# Patient Record
Sex: Male | Born: 1950 | Race: Black or African American | Hispanic: No | Smoking: Former smoker
Health system: Southern US, Community
[De-identification: ages and names within clinical notes are randomized; demographics above are authoritative.]

## PROBLEM LIST (undated history)

## (undated) DIAGNOSIS — E7800 Pure hypercholesterolemia, unspecified: Secondary | ICD-10-CM

## (undated) DIAGNOSIS — R7303 Prediabetes: Secondary | ICD-10-CM

## (undated) DIAGNOSIS — M549 Dorsalgia, unspecified: Secondary | ICD-10-CM

## (undated) DIAGNOSIS — N183 Chronic kidney disease, stage 3 unspecified: Secondary | ICD-10-CM

## (undated) DIAGNOSIS — M2550 Pain in unspecified joint: Secondary | ICD-10-CM

## (undated) DIAGNOSIS — Z951 Presence of aortocoronary bypass graft: Secondary | ICD-10-CM

## (undated) DIAGNOSIS — I252 Old myocardial infarction: Secondary | ICD-10-CM

## (undated) DIAGNOSIS — E8881 Metabolic syndrome: Secondary | ICD-10-CM

## (undated) DIAGNOSIS — E785 Hyperlipidemia, unspecified: Secondary | ICD-10-CM

## (undated) DIAGNOSIS — K219 Gastro-esophageal reflux disease without esophagitis: Secondary | ICD-10-CM

## (undated) DIAGNOSIS — I10 Essential (primary) hypertension: Secondary | ICD-10-CM

## (undated) DIAGNOSIS — Z9289 Personal history of other medical treatment: Secondary | ICD-10-CM

## (undated) DIAGNOSIS — R079 Chest pain, unspecified: Secondary | ICD-10-CM

## (undated) DIAGNOSIS — K759 Inflammatory liver disease, unspecified: Secondary | ICD-10-CM

## (undated) DIAGNOSIS — M1990 Unspecified osteoarthritis, unspecified site: Secondary | ICD-10-CM

## (undated) DIAGNOSIS — I2510 Atherosclerotic heart disease of native coronary artery without angina pectoris: Secondary | ICD-10-CM

## (undated) DIAGNOSIS — N200 Calculus of kidney: Secondary | ICD-10-CM

## (undated) HISTORY — DX: Essential (primary) hypertension: I10

## (undated) HISTORY — DX: Chest pain, unspecified: R07.9

## (undated) HISTORY — DX: Pain in unspecified joint: M25.50

## (undated) HISTORY — DX: Pure hypercholesterolemia, unspecified: E78.00

## (undated) HISTORY — DX: Dorsalgia, unspecified: M54.9

## (undated) HISTORY — DX: Metabolic syndrome: E88.810

## (undated) HISTORY — DX: Hyperlipidemia, unspecified: E78.5

## (undated) HISTORY — DX: Atherosclerotic heart disease of native coronary artery without angina pectoris: I25.10

## (undated) HISTORY — DX: Metabolic syndrome: E88.81

## (undated) HISTORY — PX: KNEE ARTHROSCOPY: SUR90

## (undated) HISTORY — DX: Personal history of other medical treatment: Z92.89

## (undated) HISTORY — DX: Old myocardial infarction: I25.2

---
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
## 2002-01-13 ENCOUNTER — Ambulatory Visit (HOSPITAL_BASED_OUTPATIENT_CLINIC_OR_DEPARTMENT_OTHER): Admission: RE | Admit: 2002-01-13 | Discharge: 2002-01-13 | Payer: Self-pay | Admitting: Orthopaedic Surgery

## 2005-06-27 ENCOUNTER — Emergency Department (HOSPITAL_COMMUNITY): Admission: EM | Admit: 2005-06-27 | Discharge: 2005-06-27 | Payer: Self-pay | Admitting: Emergency Medicine

## 2007-03-25 DIAGNOSIS — I252 Old myocardial infarction: Secondary | ICD-10-CM

## 2007-03-25 HISTORY — PX: CORONARY ANGIOPLASTY WITH STENT PLACEMENT: SHX49

## 2007-03-25 HISTORY — DX: Old myocardial infarction: I25.2

## 2007-04-15 ENCOUNTER — Inpatient Hospital Stay (HOSPITAL_COMMUNITY): Admission: EM | Admit: 2007-04-15 | Discharge: 2007-04-17 | Payer: Self-pay | Admitting: Cardiology

## 2007-04-24 HISTORY — PX: TRANSTHORACIC ECHOCARDIOGRAM: SHX275

## 2008-12-24 HISTORY — PX: CARDIAC CATHETERIZATION: SHX172

## 2009-03-08 ENCOUNTER — Encounter (INDEPENDENT_AMBULATORY_CARE_PROVIDER_SITE_OTHER): Payer: Self-pay | Admitting: *Deleted

## 2009-03-08 ENCOUNTER — Ambulatory Visit: Payer: Self-pay | Admitting: Gastroenterology

## 2009-03-11 ENCOUNTER — Telehealth (INDEPENDENT_AMBULATORY_CARE_PROVIDER_SITE_OTHER): Payer: Self-pay | Admitting: *Deleted

## 2009-04-04 ENCOUNTER — Encounter: Admission: RE | Admit: 2009-04-04 | Discharge: 2009-04-04 | Payer: Self-pay | Admitting: Cardiology

## 2009-04-07 ENCOUNTER — Ambulatory Visit (HOSPITAL_COMMUNITY): Admission: RE | Admit: 2009-04-07 | Discharge: 2009-04-07 | Payer: Self-pay | Admitting: Cardiology

## 2009-04-22 ENCOUNTER — Encounter: Payer: Self-pay | Admitting: Gastroenterology

## 2009-04-22 ENCOUNTER — Ambulatory Visit: Payer: Self-pay | Admitting: Gastroenterology

## 2009-04-25 ENCOUNTER — Encounter: Payer: Self-pay | Admitting: Gastroenterology

## 2010-06-28 ENCOUNTER — Ambulatory Visit (HOSPITAL_COMMUNITY): Admission: RE | Admit: 2010-06-28 | Discharge: 2010-06-28 | Payer: Self-pay | Admitting: Orthopaedic Surgery

## 2010-06-28 ENCOUNTER — Ambulatory Visit: Payer: Self-pay | Admitting: Vascular Surgery

## 2010-08-01 ENCOUNTER — Ambulatory Visit (HOSPITAL_BASED_OUTPATIENT_CLINIC_OR_DEPARTMENT_OTHER): Admission: RE | Admit: 2010-08-01 | Discharge: 2010-08-01 | Payer: Self-pay | Admitting: Orthopaedic Surgery

## 2011-03-09 LAB — BASIC METABOLIC PANEL
CO2: 25 mEq/L (ref 19–32)
Chloride: 106 mEq/L (ref 96–112)
GFR calc Af Amer: >60 mL/min (ref >60)
Glucose, Bld: 113 mg/dL — ABNORMAL HIGH (ref 70–99)
Potassium: 4.2 mEq/L (ref 3.5–5.1)
Sodium: 139 mEq/L (ref 135–145)

## 2011-04-04 LAB — LIPID PANEL
Cholesterol: 106 mg/dL (ref 0–200)
HDL: 21 mg/dL — ABNORMAL LOW (ref >39)
Total CHOL/HDL Ratio: 5 RATIO
VLDL: 16 mg/dL (ref 0–40)

## 2011-04-04 LAB — HEPATIC FUNCTION PANEL
ALT: 28 U/L (ref 0–53)
AST: 27 U/L (ref 0–37)
Albumin: 4 g/dL (ref 3.5–5.2)
Alkaline Phosphatase: 76 U/L (ref 39–117)
Total Protein: 7.4 g/dL (ref 6.0–8.3)

## 2011-05-08 NOTE — Cardiovascular Report (Signed)
NAMEHILLIS, MCPHATTER NO.:  [id number]   MEDICAL RECORD NO.:  [id number]          PATIENT TYPE:  OIB   LOCATION:  2899                         FACILITY:  [facility]   PHYSICIAN:  Cristy Hilts. Jacinto Halim, MD       DATE OF BIRTH:  1951/01/14   DATE OF PROCEDURE:  DATE OF DISCHARGE:  04/07/2009                            CARDIAC CATHETERIZATION   PROCEDURE PERFORMED:  1. Left ventriculography.  2. Selective right and left coronary arteriography.   INDICATIONS:  [name] is a 60 year old gentleman with a known  coronary artery disease.  He had a diagnostic cardiac catheterization  for angina pectoris and was found to have high-grade stenosis in  circumflex coronary artery.  He had undergone PTCA and stenting of the  mid circumflex coronary artery with a 4.0 x 15-mm Vision stent  implantation on April 15, 2007.  He had distal circumflex disease which  is codominant of 60-70% and a spicule of 60-70% stenosis in the proximal  to mid segment of the LAD.  Because of recurrent chest pain, he is now  brought to the cardiac catheterization lab to reevaluate his coronary  anatomy.   HEMODYNAMIC DATA:  The left ventricular pressure was 130/2 with end-  diastolic pressure of 9 mmHg.  The aortic pressure was 126/71 with a  mean of 93 mmHg.  There was no pressure gradient across the aortic  valve.   ANGIOGRAPHIC DATA:  Left ventricle:  Left ventricular systolic function  was normal with the ejection fraction of 60%.  There was no regional  wall motion abnormality.   Right coronary artery:  The right coronary artery is a dominant vessel.  It gives origin to 2 PDA branches.  It is smooth and normal.   Left main coronary artery:  Left main coronary artery is large-caliber  vessel which is smooth and normal.   Circumflex:  The circumflex coronary artery is codominant with right  coronary artery.  It gives origin to a very tiny OM1, moderate-sized  OM2, moderate-sized OM3.  There is a  mild ectasia noted in the mid  segment.  Distal circumflex shows a 60-70% stenosis unchanged from prior  cardiac catheterization.  The previously placed stent in the mid  circumflex is widely patent.   LAD:  LAD is a large caliber vessel.  There is again a calcific spicule  that is noted in the proximal to mid segment of the LAD with 60%  stenosis unchanged from prior cardiac catheterization.  Distal LAD is  very nice and smooth.   IMPRESSION AND PLAN:  [name] is a 60 year old gentleman who has  been doing well and because of recurrent angina, he was brought to the  cardiac catheterization lab and this revealed an unchanged anatomy and  widely patent mid circumflex stent.  He has also previously had a stress  test on May 14, 2007, to evaluate the significance of circumflex and the  LAD distribution and this was negative on May 14, 2007.  Given this, I  suspect his chest pain is probably of noncardiac  etiology or secondary  to small vessel disease.  I would recommend continued medical therapy.   TECHNIQUE OF PROCEDURE:  Under usual sterile precautions using a 6-  French right femoral arterial access, 6-French multipurpose B2 catheter  was advanced into the ascending aorta and then to the left ventricle.  Left ventriculography was performed both in LAO and RAO projection.  Catheter pulled into the ascending aorta.  Right  coronary artery was selectively engaged, and angiography was performed,  then left main coronary artery was selectively engaged and angiography  was performed.  Then catheter was then pulled out of the body.  The  patient tolerated the procedure well.  No immediate complications were  noted.      Cristy Hilts. Jacinto Halim, MD  Electronically Signed     JRG/MEDQ  D:  04/07/2009  T:  04/08/2009  Job:  [id number]   cc:   Gabriel Earing, M.D.

## 2011-05-11 NOTE — Cardiovascular Report (Signed)
Luis Tucker, Luis Tucker NO.:  [id number]   MEDICAL RECORD NO.:  [id number]          PATIENT TYPE:  INP   LOCATION:  2807                         FACILITY:  [facility]   PHYSICIAN:  Cristy Hilts. Jacinto Halim, MD       DATE OF BIRTH:  January 14, 1951   DATE OF PROCEDURE:  04/15/2007  DATE OF DISCHARGE:                            CARDIAC CATHETERIZATION   PROCEDURE PERFORMED:  1. Left ventriculography.  2. Selective right and left coronary angiography.  3. Abdominal aortogram.  4. Percutaneous transluminal coronary angioplasty and stenting of the      circumflex coronary artery.   INDICATIONS:  Luis Tucker is a 60 year old gentleman with hypertension and  smoking, which he quit 3 weeks ago, who has been having recurrent chest  discomfort in the form of heaviness in his chest going on for the past 2-  3 weeks.  Given heaviness in his chest, he had gone to see his primary  care physician and that time, an EKG was obtained, although was having  no chest pain at the time of EKG.  Because of significant abnormalities  in the EKG, he was sent via EMS as a STEMI directly to the cardiac  catheterization lab.  The patient was asymptomatic at the time of the  start of the cardiac catheterization.   Abdominal aortogram was performed to evaluate for abdominal  arteriosclerosis and renal artery stenosis.   Having brought the patient directly to the cardiac catheterization lab,  I felt that this was really an unstable angina versus an ST-segment-  elevation MI.   HEMODYNAMIC DATA:  The left ventricular pressure was 147/10 with end-  diastolic pressure of 15 mmHg.  The aortic pressure is 132/85 with a  mean of 105 mmHg.  There was no pressure gradient across the aortic  valve.   ANGIOGRAPHIC DATA:  Left ventricle:  Left ventricular systolic function  was normal with an ejection fraction of 65%.  There was no regional wall  motion abnormality.  There was no mitral regurgitation.   Right coronary  artery:  The right coronary artery is a codominant artery  with the circumflex coronary artery.  It has got mild diffuse disease.   Left main:  The left main has mild diffuse disease.   Circumflex:  The circumflex is a large-caliber vessel and codominant  with right coronary artery.  There is a 40% ostial stenosis followed by  a mid 90% stenosis.  There is mild ectasia in the mid-to-distal segment,  where there is a 60% stenosis.   The circumflex gives origin to a moderate-sized OM-1 and a large OM-2  and continues as a PDA branch.   LAD:  The LAD is a large-caliber vessel.  It has got diffuse luminal  irregularity.  In the midsegment at the origin of a very small diagonal  #1, there is a 70% to at most 80% stenosis, probably in the 70% range.  There is appearance of a small dissection versus a calcific plaque.  This lesion appears to be eccentric.  However, there was no of haziness.  INTERVENTIONAL DATA:  Successful PTCA and direct stenting of the mid  circumflex coronary artery.  The circumflex coronary artery was hazy and  appeared to be the culprit.  With the passage of the stent, it was  almost flow-limiting stenosis.  A 4.0 x 15-mm Vision was deployed at 16  atmospheric pressure and postdilated with a 4.5 x 9-mm Quantum at 10  atmospheric pressure and 9 atmospheric pressure for 30 and 47 seconds,  respectively.  Post balloon angioplasty, excellent results were noted.  There is brisk TIMI-3 flow.   RECOMMENDATIONS:  The patient will be continued on aggressive risk  modification.  He probably will need an IVUS interrogation of the LAD.  This can be done either as an inpatient or on an outpatient basis.  I  will also discuss these findings of my colleagues.   Abdominal aortogram revealed no evidence of abdominal aortic aneurysm.  The renal arteries were widely patent, one on either side.  There was  mild plaque in the right common iliac artery and external iliac artery.   A  total of 165 mL of contrast were utilized for diagnostic angiography.   TECHNIQUE OF THE PROCEDURE:  Under the usual sterile precautions using a  6-French right femoral artery access, a 6-French multipurpose B2  catheter was advanced into the ascending aorta over a J-wire.  Left  ventriculography was performed both in LAO and RAO projection.  Then the  right coronary artery and left main coronary were selectively engaged  and angiography was performed.  Then the catheter was pulled out of the  body in the usual fashion.   TECHNIQUE OF INTERVENTION:  A 7-French Judkins left 4 guide catheter was  utilized to engage the left main coronary artery and using heparin and  Integrilin for anticoagulation, maintaining the ACT at therapeutic  range, a 40 marker guidewire was advanced into the circumflex coronary  artery and carefully measuring the lesion length, a 4.0 x 50-mm Vision  stent was deployed at 16 atmospheric pressure and postdilated with a 4.5  x 9-mm Quantum x2 at 9 and 10 atmospheric pressure.  Post balloon  angioplasty excellent results were noted.  The patient also received  intracoronary nitroglycerin.  The guidewire was withdrawn and  angiography performed.  The guide catheter was disengaged and pulled out  of body in the usual fashion.  The patient tolerated the procedure well.  The 6-French multipurpose B2 catheter was re-advanced into the abdominal  aorta and abdominal aortogram was performed.  Right femoral angiography  was performed through the arterial access sheath and access closed with  StarClose with excellent hemostasis.  The patient tolerated the  procedure well.  The patient did have mild chest discomfort at the end  of the procedure; however, there were no EKG changes.  The patient did  have significant EKG changes during balloon inflations and the chest  pain was similar to his angina at home.      Cristy Hilts. Jacinto Halim, MD  Electronically Signed     JRG/MEDQ  D:   04/15/2007  T:  04/16/2007  Job:  [id number]   cc:   Luis Tucker, M.D.

## 2011-05-11 NOTE — Op Note (Signed)
[place]  Patient:    Luis Tucker, Luis Tucker Visit Number: [id number] MRN: [id number]          Service Type: DSU Location: [facility] Attending Physician:  Marcene Corning Dictated by:   Lubertha Basque. Jerl Santos, M.D. Proc. Date: 01/13/02 Admit Date:  01/13/2002                             Operative Report  PREOPERATIVE DIAGNOSIS: 1. Left knee torn meniscus. 2. Left knee chondromalacia.  POSTOPERATIVE DIAGNOSIS: 1. Left knee torn meniscus. 2. Left knee chondromalacia.  OPERATION PERFORMED: 1. Left knee partial lateral meniscectomy. 2. Left knee chondroplasty medial and patellofemoral compartments.  ANESTHESIA:  General.  ATTENDING SURGEON:  Lubertha Basque. Jerl Santos, M.D.  ASSISTANT:  Lindwood Qua, P.A.  INDICATIONS FOR PROCEDURE:  The patient is a 60 year old man with a very long history of left knee pain since stepping in a hole in the [employer].  This has persisted.  He has undergone an MRI scan which showed some significant chondromalacia and some degeneration of his menisci.  He has failed several different oral anti-inflammatories and the use of a brace.  At this point he is offered an arthroscopy.  The procedure was discussed with the patient and informed operative consent was obtained after discussion of possible complications of reaction to anesthesia and infection.  DESCRIPTION OF PROCEDURE:  The patient was taken to an operating suite where general anesthetic was applied without difficulty.  He was then positioned supine and prepped and draped in normal sterile fashion.  After administration of preop intravenous antibiotics an arthroscopy of the left knee was performed through two inferior portals.  Suprapatellar pouch was benign while the patellofemoral joint exhibited some grade 3 breakdown in the groove which was addressed with a thorough chondroplasty.  In the medial compartment, he also had some grade 3 breakdown of the medial femoral condyle  which was addressed with a thorough chondroplasty.  The meniscus itself appeared benign.  The ACL was definitely intact and appeared benign.  The lateral compartment was notable for some fraying of the middle horn of the lateral meniscus which was addressed with a partial lateral meniscectomy taking a tiny portion of this structure back to a stable rim.  There were no degenerative changes in the lateral compartment.  The knee was thoroughly irrigated at the case followed by placement of Marcaine with epinephrine and morphine plus Depo-Medrol. Adaptic was placed over the portals followed by dry gauze and a loose Ace wrap.  Estimated blood loss and intraoperative fluids can be obtained from Anesthesia records.  DISPOSITION:  The patient was extubated in the operating room and taken to the recovery room in stable condition.  Plans were for home to go home the same day and to follow up in the office in less than a week.  I will contact him by phone tonight. Dictated by:   Lubertha Basque Jerl Santos, M.D. Attending Physician:  Marcene Corning DD:  01/13/02 TD:  01/13/02 Job: [id number]

## 2011-05-11 NOTE — Discharge Summary (Signed)
NAMEKONSTANTIN, LEHNEN                   ACCOUNT NO.:  [id number]   MEDICAL RECORD NO.:  [id number]          PATIENT TYPE:  INP   LOCATION:  6524                         FACILITY:  [facility]   PHYSICIAN:  Cristy Hilts. Jacinto Halim, MD       DATE OF BIRTH:  02-12-1951   DATE OF ADMISSION:  04/15/2007  DATE OF DISCHARGE:  04/17/2007                               DISCHARGE SUMMARY   This is a 60 year old gentleman who has a known history of hypertension  and smoking, which he quit 3 weeks ago, who has been having recurrent  chest pain and discomfort varying from the heaviness in the chest to  tightness and pressure for the last 2 or 3 weeks.  Patient went to see  his primary care physician.  An EKG was obtained and showed significant  abnormalities and the patient was sent to the hospital via EMS.  Because  of his EKG changes, EMS activated, STEMI code and patient was taken to  the catheterization lab immediately.   Dr. Jacinto Halim performed coronary angiography, which revealed 40% proximal  circumflex lesion followed by a high-grade lesion, 90% stenosis of the  proximal to mid portion of the circumflex and the distal circumflex had  60% stenosis.  His left main had some irregularities and LAD and 70-80%  stenosis in the proximal portion before the take off of a diagonal  branch.  His renal arteries were normal and iliacs did not show any  abnormalities.   Patient tolerated procedure well.  The next morning, he complained  hemoptysis and assessment of his mouth revealed an active gum bleeding,  so Integrilin was discontinued and pressure applied to the gum.  Eventually, the oozing was stopped and patient did not have any  recurrent problems.  On April 17, 2007, he was assessed by Dr. Jenne Campus  in the morning and felt to be stable for discharge home.   HOSPITAL LABORATORIES:  His enzymes were, first set, CK 203, CK-MB 0.9,  troponin 0.03.  The second set was a CK 238, CK-MB 6.0 and troponin  0.51.  the following  test, CK 300, CK-MB 12.6, troponin 1.46.  First set  of enzymes showed CK 282, CK-MB 9.5 and troponin 1.76.  The following  morning, on April 17, 2007, the CK 218, CK-MB 3.8 and troponin 1.09.  White blood cell count was 8.1, hemoglobin 12.6, hematocrit 37.6,  platelet count is 166.  Sodium 139, potassium 3.8, chloride 103, CO2 28,  BUN 12, creatinine 1.12, glucose 97.  TSH was normal.  Total cholesterol  191, triglycerides 205, HDL 28, LDL 122.   DISCHARGE DIAGNOSES:  1. Acute coronary syndrome, status post right coronary artery      percutaneous coronary intervention on April 16, 2007.  Patient      needs percutaneous coronary intervention to left anterior      descending, which will be scheduled after his evaluation in our      office.  2. Hypertension.  3. Hyperlipidemia.  4. Tobacco use.   DISCHARGE MEDICATIONS:  1. Aspirin 325 mg daily.  2.  Plavix 75 mg daily.  3. Coreg 6.25 mg b.i.d.  4. Altace 2.5 mg daily.  5. Lipitor 80 mg daily.  6. Imdur 30 mg daily.  7. Protonix 40 mg daily.  8. Benicar 20 mg daily.   DISCHARGE DIET:  Low-fat, low-cholesterol diet.   Patient was advised to increase activity slowly.  Avoid lifting or  driving for 3 days postcath and return to work after he is seen as  outpatient in our office.   DISCHARGE FOLLOWUP:  Appointment was scheduled with Dr. Jacinto Halim on Apr 29, 2007 at 9:30 a.m.      Raymon Mutton, P.A.      Cristy Hilts. Jacinto Halim, MD  Electronically Signed    MK/MEDQ  D:  04/17/2007  T:  04/17/2007  Job:  [id number]

## 2011-05-11 NOTE — H&P (Signed)
Luis Tucker, SAR NO.:  [id number]   MEDICAL RECORD NO.:  [id number]          PATIENT TYPE:  INP   LOCATION:  2807                         FACILITY:  [facility]   PHYSICIAN:  Cristy Hilts. Jacinto Halim, MD       DATE OF BIRTH:  1951/12/05   DATE OF ADMISSION:  04/15/2007  DATE OF DISCHARGE:                              HISTORY & PHYSICAL   REQUESTING PHYSICIAN:  Concepcion Living, M.D.   CHIEF COMPLAINT:  Chest pain for the past 2 weeks.   PAST MEDICAL HISTORY:  Mr. Winer is a 60 year old gentleman with long-  standing history of hypertension; has had follow-up with physians.  He  has been taking hydrochlorothiazide on a p.r.n. basis for his  hypertension but was having chest discomfort in the middle of his chest,  describing a heaviness which has been going on for the past 2 weeks.  During these symptoms he went to see her primary care physician today at  which time an EKG was obtained and this was markedly abnormal.   Because of his chest pain and abnormal EKG and his multiple cardiac  factors, EMS was activated as a ST segment myocardial infarction and was  subsequently transferred to the [facility] catheterization lab.   On presentation patient was asymptomatic except for very minimal chest  discomfort.   He denied any shortness of breath, denied any palpitations or syncope.   REVIEW OF SYSTEMS:  He denied any bowel or bladder symptoms. Denies  neurological weakness. Denies recent weight gain, weight loss. He is not  a diabetic. Other symptoms were negative.   CURRENT MEDICATIONS:  Hydrochlorothiazide on a p.r.n. basis.   ALLERGIES:  No known drug allergies.   PAST MEDICAL HISTORY:  History of hypertension, hyperlipidemia.   PAST SURGICAL HISTORY:  None.   SOCIAL HISTORY:  He is divorced and lives with his girlfriend. Smoked  about 1 to 1-1/2 packs per day. Quit about 3 weeks ago. He is a Ecologist by profession.   FAMILY HISTORY:  There is no ischemic  cardiac disease, or diabetes in  the family.   PHYSICAL EXAMINATION:  GENERAL:  He is a well built, overweight. In no  apparent distress.  VITAL SIGNS:  Respiration was 14, blood pressure was 140/88 mmHg.  CARDIAC EXAM:  S1, S2 normal. There is no gallop or murmur.  CHEST EXAM:  Bilaterally clear with no crackles.  ABDOMEN:  Benign, no hepatosplenomegaly.  EXTREMITIES:  Normal exam.   LABORATORY FINDINGS:  EKG demonstrates ST segment elevation in V1 and V2  probably suggestive of early repolarization; left ventricular  hypertrophy.   IMPRESSION:  1. Acute coronary syndrome. I doubt this is ST elevation myocardial      infarction.  2. Hyperlipidemia.  3. Hypertension uncontrolled and untreated.  4. Smoking. States he quit just 3 weeks ago.   RECOMMENDATIONS:  The patient will have emergent cardiac catheterization  given the fact that he still has mild, very minimal discomfort. Further  recommendations following cardiac catheterization.  The patient  understands the risks, benefits and alternatives  to cardiac  catheterization.      Cristy Hilts. Jacinto Halim, MD  Electronically Signed     JRG/MEDQ  D:  04/15/2007  T:  04/15/2007  Job:  [id number]   cc:   Concepcion Living

## 2012-12-24 DIAGNOSIS — Z9289 Personal history of other medical treatment: Secondary | ICD-10-CM

## 2012-12-24 HISTORY — DX: Personal history of other medical treatment: Z92.89

## 2013-08-01 ENCOUNTER — Encounter: Payer: Self-pay | Admitting: *Deleted

## 2013-08-04 ENCOUNTER — Encounter: Payer: Self-pay | Admitting: Internal Medicine

## 2013-08-04 ENCOUNTER — Ambulatory Visit (INDEPENDENT_AMBULATORY_CARE_PROVIDER_SITE_OTHER): Payer: BC Managed Care – PPO | Admitting: Internal Medicine

## 2013-08-04 VITALS — BP 130/82 | HR 64 | Ht 71.0 in | Wt 259.1 lb

## 2013-08-04 DIAGNOSIS — R0600 Dyspnea, unspecified: Secondary | ICD-10-CM

## 2013-08-04 DIAGNOSIS — R0609 Other forms of dyspnea: Secondary | ICD-10-CM

## 2013-08-04 DIAGNOSIS — E8881 Metabolic syndrome: Secondary | ICD-10-CM

## 2013-08-04 DIAGNOSIS — Z9889 Other specified postprocedural states: Secondary | ICD-10-CM

## 2013-08-04 DIAGNOSIS — I10 Essential (primary) hypertension: Secondary | ICD-10-CM

## 2013-08-04 DIAGNOSIS — R0989 Other specified symptoms and signs involving the circulatory and respiratory systems: Secondary | ICD-10-CM

## 2013-08-04 DIAGNOSIS — I2510 Atherosclerotic heart disease of native coronary artery without angina pectoris: Secondary | ICD-10-CM

## 2013-08-04 DIAGNOSIS — E785 Hyperlipidemia, unspecified: Secondary | ICD-10-CM

## 2013-08-04 NOTE — Progress Notes (Signed)
OFFICE NOTE  Chief Complaint:  DOT evaluation and followup  Primary Care Physician: Arlan Organ., MD  HPI:  Luis Tucker is a pleasant 62 year old male with a history of coronary artery disease. He had a stent to the circumflex in 2008. 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 He also has hypertension dyslipidemia and metabolic syndrome. Is not very physically active as been unable to lose weight recently. 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 He denies any chest pain worsening shortness of breath over the past year and recently had laboratory work provided by Dr. Kathrynn Running at [facility] care which showed a well-controlled lipid profile.  PMHx:  Past Medical History  Diagnosis Date  . CAD (coronary artery disease)     cath 03/2009 demonstrated patent stent to circumflex, 60% LAD stenosis  . Hypertension   . Dyslipidemia   . Metabolic syndrome   . History of non-ST elevation myocardial infarction (NSTEMI) 03/2007    stenting to circumflex  . History of nuclear stress test 08/2011    exercise myoview; perfusion defect consistent with infarct/scar (basal/mid/apical inferior regions); low risk scan     Past Surgical History  Procedure Laterality Date  . Coronary angioplasty with stent placement  03/2007    stent to circumflex; 60% residual LAD stenosis  . Transthoracic echocardiogram  04/2007    EF >55%; mild concentric LVH; borderline RV enlargement; RA & LA mildly dilated; mild MR, mild TR, mild PV regurg    FAMHx:  Family History  Problem Relation Age of Onset  . Cancer - Prostate Father     SOCHx:   reports that he quit smoking about 6 years ago. His smoking use included Cigarettes. He smoked 0.00 packs per day. He has never used smokeless tobacco. He reports that  drinks alcohol. He reports that he does not use illicit drugs.  ALLERGIES:  No Known Allergies  ROS: A comprehensive review of systems  was negative except for: Respiratory: positive for dyspnea on exertion  HOME MEDS: Current Outpatient Prescriptions  Medication Sig Dispense Refill  . aspirin 81 MG tablet Take 125 mg by mouth 2 (two) times a week.       Marland Kitchen atorvastatin (LIPITOR) 80 MG tablet Take 80 mg by mouth daily.      . carvedilol (COREG) 12.5 MG tablet Take 12.5 mg by mouth 2 (two) times daily with a meal.      . losartan (COZAAR) 100 MG tablet Take 100 mg by mouth daily.      . pantoprazole (PROTONIX) 40 MG tablet Take 40 mg by mouth daily as needed.      . triamterene-hydrochlorothiazide (MAXZIDE-25) 37.5-25 MG per tablet Take 1 tablet by mouth daily.        No current facility-administered medications for this visit.    LABS/IMAGING: No results found for this or any previous visit (from the past 48 hour(s)). No results found.  VITALS: BP 130/82  Pulse 64  Ht 5\' 11"  (1.803 m)  Wt 259 lb 1.6 oz (117.527 kg)  BMI 36.15 kg/m2  EXAM: General appearance: alert and no distress Neck: no adenopathy, no carotid bruit, no JVD, supple, symmetrical, trachea midline and thyroid not enlarged, symmetric, no tenderness/mass/nodules Lungs: clear to auscultation bilaterally Heart: regular rate and rhythm, S1, S2 normal, no murmur, click, rub or gallop Abdomen: soft, non-tender; bowel sounds normal; no masses,  no organomegaly Extremities: extremities normal,  atraumatic, no cyanosis or edema Pulses: 2+ and symmetric Skin: Skin color, texture, turgor normal. No rashes or lesions Neurologic: Grossly normal  EKG: Sinus rhythm at 64  ASSESSMENT: 1. Coronary artery disease status post PCI to the circumflex in 2008 2. Metabolic syndrome 3. Hypertension 4. Hyperlipidemia 5. Mild dyspnea on exertion  PLAN: 1.   Mr. Gloss is due for a biannual DOT physical. His last chest test was 2 years ago and was negative for ischemia. He denies any chest pain but does have some shortness of breath with exertion. This could be due to  weight, deconditioning or possibly ischemia. I would recommend repeating a stress test and will contact him with the results. His cholesterol and blood pressure appear to be well controlled on his current medications and I would not revise any changes at this time.  Chrystie Nose, MD, [facility] Attending Cardiologist [facility]  Ashey Tramontana C 08/04/2013, 9:04 AM

## 2013-08-04 NOTE — Patient Instructions (Addendum)
Your physician has requested that you have en exercise stress myoview. For further information please visit [email or web address] Please follow instruction sheet, as given.  Your physician wants you to follow-up in: 1 year. You will receive a reminder letter in the mail two months in advance. If you don't receive a letter, please call our office to schedule the follow-up appointment.   DASH Diet The DASH diet stands for "Dietary Approaches to Stop Hypertension." It is a healthy eating plan that has been shown to reduce high blood pressure (hypertension) in as little as 14 days, while also possibly providing other significant health benefits. These other health benefits include reducing the risk of breast cancer after menopause and reducing the risk of type 2 diabetes, heart disease, colon cancer, and stroke. Health benefits also include weight loss and slowing kidney failure in patients with chronic kidney disease.  DIET GUIDELINES  Limit salt (sodium). Your diet should contain less than 1500 mg of sodium daily.  Limit refined or processed carbohydrates. Your diet should include mostly whole grains. Desserts and added sugars should be used sparingly.  Include small amounts of heart-healthy fats. These types of fats include nuts, oils, and tub margarine. Limit saturated and trans fats. These fats have been shown to be harmful in the body. CHOOSING FOODS  The following food groups are based on a 2000 calorie diet. See your Registered Dietitian for individual calorie needs. Grains and Grain Products (6 to 8 servings daily)  Eat More Often: Whole-wheat bread, brown rice, whole-grain or wheat pasta, quinoa, popcorn without added fat or salt (air popped).  Eat Less Often: White bread, white pasta, white rice, cornbread. Vegetables (4 to 5 servings daily)  Eat More Often: Fresh, frozen, and canned vegetables. Vegetables may be raw, steamed, roasted, or grilled with a minimal amount of fat.  Eat Less  Often/Avoid: Creamed or fried vegetables. Vegetables in a cheese sauce. Fruit (4 to 5 servings daily)  Eat More Often: All fresh, canned (in natural juice), or frozen fruits. Dried fruits without added sugar. One hundred percent fruit juice ( cup [237 mL] daily).  Eat Less Often: Dried fruits with added sugar. Canned fruit in light or heavy syrup. [employer], Fish, and Poultry (2 servings or less daily. One serving is 3 to 4 oz [85-114 g]).  Eat More Often: Ninety percent or leaner ground beef, tenderloin, sirloin. Round cuts of beef, chicken breast, [place] breast. All fish. Grill, bake, or broil your meat. Nothing should be fried.  Eat Less Often/Avoid: Fatty cuts of meat, [place], or chicken leg, thigh, or wing. Fried cuts of meat or fish. Dairy (2 to 3 servings)  Eat More Often: Low-fat or fat-free milk, low-fat plain or light yogurt, reduced-fat or part-skim cheese.  Eat Less Often/Avoid: Milk (whole, 2%).Whole milk yogurt. Full-fat cheeses. Nuts, Seeds, and Legumes (4 to 5 servings per week)  Eat More Often: All without added salt.  Eat Less Often/Avoid: Salted nuts and seeds, canned beans with added salt. Fats and Sweets (limited)  Eat More Often: Vegetable oils, tub margarines without trans fats, sugar-free gelatin. Mayonnaise and salad dressings.  Eat Less Often/Avoid: Coconut oils, palm oils, butter, stick margarine, cream, half and half, cookies, candy, pie. FOR MORE INFORMATION The Dash Diet Eating Plan: www.dashdiet.org Document Released: 11/29/2011 Document Revised: 03/03/2012 Document Reviewed: 11/29/2011 [facility] Patient Information 2014 [place], [place].

## 2013-08-13 ENCOUNTER — Encounter (HOSPITAL_COMMUNITY): Payer: BC Managed Care – PPO

## 2013-08-14 ENCOUNTER — Ambulatory Visit (HOSPITAL_COMMUNITY)
Admission: RE | Admit: 2013-08-14 | Discharge: 2013-08-14 | Disposition: A | Discharge status: Home / Self Care | Payer: BC Managed Care – PPO | Source: Ambulatory Visit | Attending: Cardiovascular Disease | Admitting: Cardiovascular Disease

## 2013-08-14 DIAGNOSIS — Z9889 Other specified postprocedural states: Secondary | ICD-10-CM

## 2013-08-14 DIAGNOSIS — E669 Obesity, unspecified: Secondary | ICD-10-CM | POA: Insufficient documentation

## 2013-08-14 DIAGNOSIS — I10 Essential (primary) hypertension: Secondary | ICD-10-CM | POA: Insufficient documentation

## 2013-08-14 DIAGNOSIS — I2510 Atherosclerotic heart disease of native coronary artery without angina pectoris: Secondary | ICD-10-CM | POA: Insufficient documentation

## 2013-08-14 DIAGNOSIS — R0609 Other forms of dyspnea: Secondary | ICD-10-CM | POA: Insufficient documentation

## 2013-08-14 DIAGNOSIS — R0989 Other specified symptoms and signs involving the circulatory and respiratory systems: Secondary | ICD-10-CM | POA: Insufficient documentation

## 2013-08-14 DIAGNOSIS — R0602 Shortness of breath: Secondary | ICD-10-CM | POA: Insufficient documentation

## 2013-08-14 DIAGNOSIS — R0600 Dyspnea, unspecified: Secondary | ICD-10-CM

## 2013-08-14 DIAGNOSIS — I252 Old myocardial infarction: Secondary | ICD-10-CM | POA: Insufficient documentation

## 2013-08-14 MED ORDER — TECHNETIUM TC 99M SESTAMIBI GENERIC - CARDIOLITE
30.0000 | Freq: Once | INTRAVENOUS | Status: AC | PRN
  Administered 2013-08-14: 30 via INTRAVENOUS

## 2013-08-14 MED ORDER — TECHNETIUM TC 99M SESTAMIBI GENERIC - CARDIOLITE
10.0000 | Freq: Once | INTRAVENOUS | Status: AC | PRN
  Administered 2013-08-14: 10 via INTRAVENOUS

## 2013-08-14 NOTE — Procedures (Addendum)
[facility] [address] [place] [address] [phone]  Cardiology Nuclear Med Study  Luis Tucker is a 62 y.o. male     MRN : [id number]     DOB: [birth year]  Procedure Date: 08/14/2013  Nuclear Med Background Indication for Stress Test:  Evaluation for Ischemia History:  CAD;MI-03/2003;STENT/PTCA--03/2007 Cardiac Risk Factors: History of Smoking, Hypertension, Lipids and Obesity  Symptoms:  DOE and SOB   Nuclear Pre-Procedure Caffeine/Decaff Intake:  7:00pm NPO After: 5:00am   IV Site: R Hand  IV 0.9% NS with Angio Cath:  22g  Chest Size (in):  46"  IV Started by: Emmit Pomfret, RN  Height: 5\' 11"  (1.803 m)  Cup Size: n/a  BMI:  Body mass index is 36.14 kg/(m^2). Weight:  259 lb (117.482 kg)   Tech Comments:  N/A    Nuclear Med Study 1 or 2 day study: 1 day  Stress Test Type:  Stress  Order Authorizing Provider:  KENNETH HILTY,MD   Resting Radionuclide: Technetium 72m Sestamibi  Resting Radionuclide Dose: 10.2 mCi   Stress Radionuclide:  Technetium 60m Sestamibi  Stress Radionuclide Dose: 31.0 mCi           Stress Protocol Rest HR: 64 Stress HR:  155  Rest BP: 131/68 Stress BP: 137/104  Exercise Time (min): 6:30 METS: 7.8   Predicted Max HR: 160 bpm % Max HR: 96.88 bpm Rate Pressure Product: 21308  Dose of Adenosine (mg):  n/a Dose of Lexiscan: n/a mg  Dose of Atropine (mg): n/a Dose of Dobutamine: n/a mcg/kg/min (at max HR)  Stress Test Technologist: Esperanza Sheets, CCT Nuclear Technologist: Koren Shiver, CNMT   Rest Procedure:  Myocardial perfusion imaging was performed at rest 45 minutes following the intravenous administration of Technetium 92m Sestamibi. Stress Procedure:  The patient performed treadmill exercise using a Bruce  Protocol for 6:30 minutes. The patient stopped due to SOB and  Leg Fatigue and denied any chest pain.  There were no significant ST-T wave changes.  Technetium 46m Sestamibi  was injected at peak exercise and myocardial perfusion imaging was performed after a brief delay.  Transient Ischemic Dilatation (Normal <1.22):  0.92 Lung/Heart Ratio (Normal <0.45):  0.44 QGS EDV:  130 ml QGS ESV:  53 ml LV Ejection Fraction: 59%     Rest ECG: NSR, IVCD, lateral ST-T changes  Stress ECG: No significant change from baseline ECG and There are scattered PVCs.  QPS Raw Data Images:  There is interference from nuclear activity from structures below the diaphragm, especially severe during the resting images. This does affect the ability to read the study. Stress Images:  There is decreased uptake in the inferior wall. This involves the apical half of the inferior wall. Rest Images:  The inferior wall is obscured by visceral uptake. Subtraction (SDS):  Cannot distinguish inferior wall fixed versus reversible defect  Impression Exercise Capacity:  Fair exercise capacity. BP Response:  Normal blood pressure response. Clinical Symptoms:  There is dyspnea. ECG Impression:  No significant ST segment change suggestive of ischemia. Comparison with Prior Nuclear Study: The 2012 study showed evidence of a inferior wall scar  Overall Impression:  Low risk stress nuclear study . Although the current study does not permit evaluation of the inferior wall on the current rest images, the old data suggests that there is a nontransmural inferior wall scar..  LV Wall Motion:  Normal overall LV function with mild inferior wall hypokinesis.   Corion Sherrod,  MD  08/14/2013 6:13 PM

## 2014-09-17 ENCOUNTER — Encounter: Payer: Self-pay | Admitting: Gastroenterology

## 2015-07-19 ENCOUNTER — Encounter (HOSPITAL_COMMUNITY): Payer: Self-pay | Admitting: Emergency Medicine

## 2015-07-19 ENCOUNTER — Inpatient Hospital Stay (HOSPITAL_COMMUNITY)
Admission: EM | Admit: 2015-07-19 | Discharge: 2015-07-21 | DRG: 247 | Disposition: A | Discharge status: Home / Self Care | Payer: Managed Care, Other (non HMO) | Attending: Cardiology | Admitting: Cardiology

## 2015-07-19 DIAGNOSIS — K219 Gastro-esophageal reflux disease without esophagitis: Secondary | ICD-10-CM | POA: Diagnosis present

## 2015-07-19 DIAGNOSIS — I252 Old myocardial infarction: Secondary | ICD-10-CM

## 2015-07-19 DIAGNOSIS — Z7982 Long term (current) use of aspirin: Secondary | ICD-10-CM | POA: Diagnosis not present

## 2015-07-19 DIAGNOSIS — N183 Chronic kidney disease, stage 3 (moderate): Secondary | ICD-10-CM | POA: Diagnosis present

## 2015-07-19 DIAGNOSIS — Z6833 Body mass index (BMI) 33.0-33.9, adult: Secondary | ICD-10-CM | POA: Diagnosis not present

## 2015-07-19 DIAGNOSIS — E876 Hypokalemia: Secondary | ICD-10-CM | POA: Diagnosis present

## 2015-07-19 DIAGNOSIS — Z87891 Personal history of nicotine dependence: Secondary | ICD-10-CM | POA: Diagnosis not present

## 2015-07-19 DIAGNOSIS — E8881 Metabolic syndrome: Secondary | ICD-10-CM | POA: Diagnosis present

## 2015-07-19 DIAGNOSIS — E669 Obesity, unspecified: Secondary | ICD-10-CM | POA: Diagnosis present

## 2015-07-19 DIAGNOSIS — Z955 Presence of coronary angioplasty implant and graft: Secondary | ICD-10-CM

## 2015-07-19 DIAGNOSIS — I25110 Atherosclerotic heart disease of native coronary artery with unstable angina pectoris: Principal | ICD-10-CM | POA: Diagnosis present

## 2015-07-19 DIAGNOSIS — D696 Thrombocytopenia, unspecified: Secondary | ICD-10-CM | POA: Diagnosis not present

## 2015-07-19 DIAGNOSIS — I208 Other forms of angina pectoris: Secondary | ICD-10-CM

## 2015-07-19 DIAGNOSIS — Z79899 Other long term (current) drug therapy: Secondary | ICD-10-CM | POA: Diagnosis not present

## 2015-07-19 DIAGNOSIS — E785 Hyperlipidemia, unspecified: Secondary | ICD-10-CM | POA: Diagnosis present

## 2015-07-19 DIAGNOSIS — D649 Anemia, unspecified: Secondary | ICD-10-CM | POA: Diagnosis not present

## 2015-07-19 DIAGNOSIS — I129 Hypertensive chronic kidney disease with stage 1 through stage 4 chronic kidney disease, or unspecified chronic kidney disease: Secondary | ICD-10-CM | POA: Diagnosis present

## 2015-07-19 DIAGNOSIS — R0609 Other forms of dyspnea: Secondary | ICD-10-CM | POA: Diagnosis present

## 2015-07-19 DIAGNOSIS — I200 Unstable angina: Secondary | ICD-10-CM | POA: Diagnosis present

## 2015-07-19 DIAGNOSIS — I10 Essential (primary) hypertension: Secondary | ICD-10-CM | POA: Diagnosis present

## 2015-07-19 HISTORY — DX: Chronic kidney disease, stage 3 (moderate): N18.3

## 2015-07-19 HISTORY — DX: Gastro-esophageal reflux disease without esophagitis: K21.9

## 2015-07-19 HISTORY — DX: Chronic kidney disease, stage 3 unspecified: N18.30

## 2015-07-19 HISTORY — DX: Prediabetes: R73.03

## 2015-07-19 HISTORY — DX: Inflammatory liver disease, unspecified: K75.9

## 2015-07-19 LAB — CBC
HEMATOCRIT: 41.2 % (ref 39.0–52.0)
HEMOGLOBIN: 13.6 g/dL (ref 13.0–17.0)
MCH: 27.6 pg (ref 26.0–34.0)
MCHC: 33 g/dL (ref 30.0–36.0)
MCV: 83.7 fL (ref 78.0–100.0)
PLATELETS: 150 10*3/uL (ref 150–400)
RBC: 4.92 MIL/uL (ref 4.22–5.81)
RDW: 13.4 % (ref 11.5–15.5)
WBC: 9.9 10*3/uL (ref 4.0–10.5)

## 2015-07-19 LAB — DIFFERENTIAL
BASOS PCT: 0 % (ref 0–1)
Basophils Absolute: 0 10*3/uL (ref 0.0–0.1)
Eosinophils Absolute: 0 10*3/uL (ref 0.0–0.7)
Eosinophils Relative: 0 % (ref 0–5)
Lymphocytes Relative: 28 % (ref 12–46)
Lymphs Abs: 2.7 10*3/uL (ref 0.7–4.0)
Monocytes Absolute: 0.5 10*3/uL (ref 0.1–1.0)
Monocytes Relative: 5 % (ref 3–12)
NEUTROS ABS: 6.6 10*3/uL (ref 1.7–7.7)
Neutrophils Relative %: 67 % (ref 43–77)

## 2015-07-19 LAB — BASIC METABOLIC PANEL
Anion gap: 8 (ref 5–15)
BUN: 15 mg/dL (ref 6–20)
CHLORIDE: 102 mmol/L (ref 101–111)
CO2: 25 mmol/L (ref 22–32)
CREATININE: 1.29 mg/dL — AB (ref 0.61–1.24)
Calcium: 8.6 mg/dL — ABNORMAL LOW (ref 8.9–10.3)
GFR, EST NON AFRICAN AMERICAN: 58 mL/min — AB (ref >60)
Glucose, Bld: 92 mg/dL (ref 65–99)
POTASSIUM: 3.7 mmol/L (ref 3.5–5.1)
Sodium: 135 mmol/L (ref 135–145)

## 2015-07-19 LAB — APTT: aPTT: 30 seconds (ref 24–37)

## 2015-07-19 LAB — I-STAT CHEM 8, ED
BUN: 19 mg/dL (ref 6–20)
CREATININE: 1.3 mg/dL — AB (ref 0.61–1.24)
Calcium, Ion: 1.11 mmol/L — ABNORMAL LOW (ref 1.13–1.30)
Chloride: 102 mmol/L (ref 101–111)
Glucose, Bld: 95 mg/dL (ref 65–99)
HCT: 43 % (ref 39.0–52.0)
Hemoglobin: 14.6 g/dL (ref 13.0–17.0)
POTASSIUM: 3.7 mmol/L (ref 3.5–5.1)
SODIUM: 139 mmol/L (ref 135–145)
TCO2: 24 mmol/L (ref 0–100)

## 2015-07-19 LAB — TSH: TSH: 2.608 u[IU]/mL (ref 0.350–4.500)

## 2015-07-19 LAB — PROTIME-INR
INR: 1.12 (ref 0.00–1.49)
Prothrombin Time: 14.6 seconds (ref 11.6–15.2)

## 2015-07-19 LAB — TROPONIN I

## 2015-07-19 LAB — I-STAT TROPONIN, ED: Troponin i, poc: 0 ng/mL (ref 0.00–0.08)

## 2015-07-19 LAB — HEPARIN LEVEL (UNFRACTIONATED): HEPARIN UNFRACTIONATED: 0.48 [IU]/mL (ref 0.30–0.70)

## 2015-07-19 MED ORDER — ASPIRIN EC 81 MG PO TBEC
81.0000 mg | DELAYED_RELEASE_TABLET | Freq: Every day | ORAL | Status: DC
  Administered 2015-07-21: 81 mg via ORAL
  Filled 2015-07-19 (×2): qty 1

## 2015-07-19 MED ORDER — SODIUM CHLORIDE 0.9 % WEIGHT BASED INFUSION: 1.0000 mL/kg/h | INTRAVENOUS | Status: DC

## 2015-07-19 MED ORDER — ATORVASTATIN CALCIUM 80 MG PO TABS
80.0000 mg | ORAL_TABLET | Freq: Every day | ORAL | Status: DC
  Administered 2015-07-20 – 2015-07-21 (×2): 80 mg via ORAL
  Filled 2015-07-19 (×3): qty 1

## 2015-07-19 MED ORDER — HEPARIN BOLUS VIA INFUSION
4000.0000 [IU] | Freq: Once | INTRAVENOUS | Status: AC
  Administered 2015-07-19: 4000 [IU] via INTRAVENOUS
  Filled 2015-07-19: qty 4000

## 2015-07-19 MED ORDER — ALPRAZOLAM 0.25 MG PO TABS: 0.2500 mg | ORAL_TABLET | Freq: Two times a day (BID) | ORAL | Status: DC | PRN

## 2015-07-19 MED ORDER — ASPIRIN 81 MG PO CHEW
324.0000 mg | CHEWABLE_TABLET | Freq: Once | ORAL | Status: DC
  Filled 2015-07-19: qty 4

## 2015-07-19 MED ORDER — HEPARIN SODIUM (PORCINE) 5000 UNIT/ML IJ SOLN: 4000.0000 [IU] | Freq: Once | INTRAMUSCULAR | Status: DC

## 2015-07-19 MED ORDER — ONDANSETRON HCL 4 MG/2ML IJ SOLN: 4.0000 mg | Freq: Four times a day (QID) | INTRAMUSCULAR | Status: DC | PRN

## 2015-07-19 MED ORDER — SODIUM CHLORIDE 0.9 % IJ SOLN: 3.0000 mL | INTRAMUSCULAR | Status: DC | PRN

## 2015-07-19 MED ORDER — HEPARIN SODIUM (PORCINE) 5000 UNIT/ML IJ SOLN
INTRAMUSCULAR | Status: AC
  Filled 2015-07-19: qty 1

## 2015-07-19 MED ORDER — CARVEDILOL 12.5 MG PO TABS
12.5000 mg | ORAL_TABLET | Freq: Two times a day (BID) | ORAL | Status: DC
  Administered 2015-07-19 – 2015-07-21 (×4): 12.5 mg via ORAL
  Filled 2015-07-19 (×6): qty 1

## 2015-07-19 MED ORDER — SODIUM CHLORIDE 0.9 % IV SOLN: 250.0000 mL | INTRAVENOUS | Status: DC | PRN

## 2015-07-19 MED ORDER — SODIUM CHLORIDE 0.9 % WEIGHT BASED INFUSION
3.0000 mL/kg/h | INTRAVENOUS | Status: DC
  Administered 2015-07-20: 3 mL/kg/h via INTRAVENOUS

## 2015-07-19 MED ORDER — SODIUM CHLORIDE 0.9 % IJ SOLN
3.0000 mL | Freq: Two times a day (BID) | INTRAMUSCULAR | Status: DC
  Administered 2015-07-19 – 2015-07-20 (×2): 3 mL via INTRAVENOUS

## 2015-07-19 MED ORDER — ACETAMINOPHEN 325 MG PO TABS: 650.0000 mg | ORAL_TABLET | ORAL | Status: DC | PRN

## 2015-07-19 MED ORDER — PANTOPRAZOLE SODIUM 40 MG PO TBEC: 40.0000 mg | DELAYED_RELEASE_TABLET | Freq: Every day | ORAL | Status: DC | PRN

## 2015-07-19 MED ORDER — HEPARIN (PORCINE) IN NACL 100-0.45 UNIT/ML-% IJ SOLN
1400.0000 [IU]/h | INTRAMUSCULAR | Status: DC
  Administered 2015-07-19 – 2015-07-20 (×2): 1400 [IU]/h via INTRAVENOUS
  Filled 2015-07-19 (×3): qty 250

## 2015-07-19 MED ORDER — NITROGLYCERIN 0.4 MG SL SUBL: 0.4000 mg | SUBLINGUAL_TABLET | SUBLINGUAL | Status: DC | PRN

## 2015-07-19 MED ORDER — SODIUM CHLORIDE 0.9 % IJ SOLN: 3.0000 mL | Freq: Two times a day (BID) | INTRAMUSCULAR | Status: DC

## 2015-07-19 MED ORDER — ASPIRIN 81 MG PO CHEW
81.0000 mg | CHEWABLE_TABLET | ORAL | Status: AC
  Administered 2015-07-20: 81 mg via ORAL
  Filled 2015-07-19: qty 1

## 2015-07-19 MED ORDER — ZOLPIDEM TARTRATE 5 MG PO TABS: 5.0000 mg | ORAL_TABLET | Freq: Every evening | ORAL | Status: DC | PRN

## 2015-07-19 NOTE — H&P (Signed)
History and Physical   Patient ID: Luis Tucker MRN: [id number], DOB/AGE: 1951/07/05 64 y.o. Date of Encounter: 07/19/2015  Primary Physician: Arlan Organ., MD Primary Cardiologist: Dr Rennis Golden  Chief Complaint:  Chest pain  HPI: GREOGRY Tucker is a 64 y.o. male with a history of CAD, HTN, HL, obesity, remote tobacco use. His last cath was in 2010, and showed moderate but nonobstructive CAD in 2 vessels. His last stress test was in 2014, and showed inferior scar, no ischemia and a preserved EF.  For the last week or 2, he has been noticing consistent exertional chest pain. He describes it alternatively as a tightness, pressure and throbbing. He will get it with exertion that is above his usual level of exertion. It will resolve with rest in less than 10 minutes. It is associated with some mild shortness of breath, but no nausea, vomiting, or diaphoresis. He has had multiple episodes. He had one within the last 24 hours. He went to urgent care today for another issue, and mentioned the chest pain. An ECG was performed and was significantly abnormal, so he was sent to the emergency room.  In the emergency room, his ECG is still abnormal, but he is resting comfortably and his initial cardiac enzymes were negative.  Past Medical History  Diagnosis Date  . CAD (coronary artery disease)     cath 03/2009 demonstrated patent stent to circumflex, 60% LAD stenosis  . Hypertension   . Dyslipidemia   . Metabolic syndrome   . History of non-ST elevation myocardial infarction (NSTEMI) 03/2007    4.0 x 15 mm Vision BMS to mCFX  . History of nuclear stress test 2014    exercise myoview; perfusion defect consistent with infarct/scar (basal/mid/apical inferior regions); low risk scan     Surgical History:  Past Surgical History  Procedure Laterality Date  . Coronary angioplasty with stent placement  03/2007    4.0 x 15-mm Vision BMS mCFX; 60% residual LAD  . Transthoracic echocardiogram  04/2007   EF >55%; mild concentric LVH; borderline RV enlargement; RA & LA mildly dilated; mild MR, mild TR, mild PV regurg  . Knee arthroscopy Left     x 2  . Cardiac catheterization  2010    [place], LAD 60%, CFX 60-70%, stent OK, RCA OK, EF 60%     I have reviewed the patient's current medications. Prior to Admission medications   Medication Sig Start Date End Date Taking? Authorizing Provider  aspirin 81 MG tablet Take 125 mg by mouth 2 (two) times a week.     Historical Provider, MD  atorvastatin (LIPITOR) 80 MG tablet Take 80 mg by mouth daily.    Historical Provider, MD  carvedilol (COREG) 12.5 MG tablet Take 12.5 mg by mouth 2 (two) times daily with a meal.    Historical Provider, MD  losartan (COZAAR) 100 MG tablet Take 100 mg by mouth daily.    Historical Provider, MD  pantoprazole (PROTONIX) 40 MG tablet Take 40 mg by mouth daily as needed.    Historical Provider, MD  triamterene-hydrochlorothiazide (MAXZIDE-25) 37.5-25 MG per tablet Take 1 tablet by mouth daily.     Historical Provider, MD   Scheduled Meds: . aspirin  324 mg Oral Once  . heparin  4,000 Units Intravenous Once   Continuous Infusions: . heparin     PRN Meds:.  Allergies: No Known Allergies  History   Social History  . Marital Status: Married  Spouse Name: N/A  . Number of Children: 3  . Years of Education: N/A   Occupational History  . 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 Smoking status: Former Smoker -- 1.00 packs/day    Types: Cigarettes    Quit date: 03/25/2007  . Smokeless tobacco: Never Used  . Alcohol Use: 0.0 oz/week    0 Standard drinks or equivalent per week     Comment: beer occasionally   . Drug Use: No  . Sexual Activity: Not on file   Other Topics Concern  . Not on file   Social History Narrative   Pt lives with girlfriend.    Family History  Problem Relation Age of Onset  . Cancer - Prostate Father    Family Status  Relation Status  Death Age  . Mother Deceased 79    pneumonia  . Father Deceased 35    prostate cancer    Review of Systems:   Full 14-point review of systems otherwise negative except as noted above.  Physical Exam: Blood pressure 102/65, pulse 71, resp. rate 19, height 6' (1.829 m), weight 244 lb (110.678 kg), SpO2 99 %. General: Well developed, well nourished,male in no acute distress. Head: Normocephalic, atraumatic, sclera non-icteric, no xanthomas, nares are without discharge. Dentition: Good Neck: No carotid bruits. JVD not elevated. No thyromegally Lungs: Good expansion bilaterally. without wheezes or rhonchi.  Heart: Regular rate and rhythm with S1 S2.  No S3 or S4.  No murmur, no rubs, or gallops appreciated. Abdomen: Soft, non-tender, non-distended with normoactive bowel sounds. No hepatomegaly. No rebound/guarding. No obvious abdominal masses. Msk:  Strength and tone appear normal for age. No joint deformities or effusions, no spine or costo-vertebral angle tenderness. Extremities: No clubbing or cyanosis. No edema.  Distal pedal pulses are 2+ in 4 extrem Neuro: Alert and oriented X 3. Moves all extremities spontaneously. No focal deficits noted. Psych:  Responds to questions appropriately with a normal affect. Skin: No rashes or lesions noted  Labs:   Lab Results  Component Value Date   WBC 9.9 07/19/2015   HGB 14.6 07/19/2015   HCT 43.0 07/19/2015   MCV 83.7 07/19/2015   PLT 150 07/19/2015    Recent Labs Lab 07/19/15 1420 07/19/15 1437  NA 135 139  K 3.7 3.7  CL 102 102  CO2 25  --   BUN 15 19  CREATININE 1.29* 1.30*  CALCIUM 8.6*  --   GLUCOSE 92 95    Recent Labs  07/19/15 1436  TROPIPOC 0.00   Radiology/Studies:   MV 2014 Impression Exercise Capacity: Fair exercise capacity. BP Response: Normal blood pressure response. Clinical Symptoms: There is dyspnea. ECG Impression: No significant ST segment change suggestive of ischemia. Comparison with Prior  Nuclear Study: The 2012 study showed inferior wall scar Overall Impression: Low risk stress nuclear study . Although the current study does not permit evaluation of the inferior wall on the current rest images, the old data suggests that there is a nontransmural inferior wall scar.. LV Wall Motion: Normal overall LV function with mild inferior wall hypokinesis.  Cardiac Cath:  Right coronary artery: The right coronary artery is a dominant vessel. It gives origin to 2 PDA branches. It is smooth and normal.  Left main coronary artery: Left main coronary artery is large-caliber vessel which is smooth and normal.  Circumflex: The circumflex coronary artery is codominant with right coronary artery. It gives origin to a very  tiny OM1, moderate-sized OM2, moderate-sized OM3. There is a mild ectasia noted in the mid segment. Distal circumflex shows a 60-70% stenosis unchanged from prior cardiac catheterization. The previously placed stent in the mid circumflex is widely patent.  LAD: LAD is a large caliber vessel. There is again a calcific spicule that is noted in the proximal to mid segment of the LAD with 60% stenosis unchanged from prior cardiac catheterization. Distal LAD is very nice and smooth.  IMPRESSION AND PLAN: Mr. Ernan Runkles is a 64 year old gentleman who has been doing well and because of recurrent angina, he was brought to the cardiac catheterization lab and this revealed an unchanged anatomy and widely patent mid circumflex stent. He has also previously had a stress test on May 14, 2007, to evaluate the significance of circumflex and the LAD distribution and this was negative on May 14, 2007. Given this, I suspect his chest pain is probably of noncardiac etiology or secondary to small vessel disease. I would recommend continued medical therapy.  ECG: 07/19/2015 SR, J point elevation and inferolateral T waves are different from previous  ECGs  ASSESSMENT AND PLAN:  Principal Problem:   Crescendo angina - Admit, cycle cardiac enzymes, add heparin, continue aspirin, statin and beta blocker - Feel cardiac catheterization is indicated, The risks and benefits of a cardiac catheterization including, but not limited to, death, stroke, MI, kidney damage and bleeding were discussed with the patient who indicates understanding and agrees to proceed.   Active Problems:   HTN (hypertension) - His creatinine slightly elevated, we'll hold his ARB and diuretic for now, use hydralazine for blood pressure control - Currently BP is under good control    Hyperlipidemia - Continue Lipitor, check a profile and LFTs  Signed, Leanna Battles 07/19/2015 3:18 PM Beeper [phone] Patient seen and examined. I agree with the assessment and plan as detailed above. See also my additional thoughts below.   The patient has had recurrent ischemic chest discomfort. It is now limiting for him. He is admitted for catheterization tomorrow.  Willa Rough, MD, [facility] 07/19/2015 5:07 PM

## 2015-07-19 NOTE — ED Notes (Signed)
Chest pain with strenuous activity for 1 week-- took 2 ASA at home-- went to urgent care this am for rx refill-- sent here-- came by private vehicle.

## 2015-07-19 NOTE — ED Provider Notes (Signed)
CSN: [id number]     Arrival date & time 07/19/15  1416 History   First MD Initiated Contact with Patient 07/19/15 1444     Chief Complaint  Patient presents with  . Chest Pain     (Consider location/radiation/quality/duration/timing/severity/associated sxs/prior Treatment) HPI Comments: 64 year old male with history of high blood pressure, cardiac stents 7 years ago, CAD, lipids presents with concern for heart attack. Primary doctor sent patient over the abnormal EKG. Reviewed EKG on arrival showing concern for heart strain with mild T-wave inversion in inferior depression with very mild anterior elevation in lateral depression. Patient has not had any chest pain today. Patient has had chest pain with activity exertional including working at work and sexual activity. This is been worsening the past few weeks. Patient seen primary Dr. for medicine refill and discuss this further. Past smoker. No recent surgery, no blood clot history, no unilateral leg swelling.  Patient is a 64 y.o. male presenting with chest pain. The history is provided by the patient and medical records.  Chest Pain Associated symptoms: no abdominal pain, no back pain, no fever, no headache, no shortness of breath and not vomiting     Past Medical History  Diagnosis Date  . CAD (coronary artery disease)     cath 03/2009 demonstrated patent stent to circumflex, 60% LAD stenosis  . Hypertension   . Dyslipidemia   . Metabolic syndrome   . History of non-ST elevation myocardial infarction (NSTEMI) 03/2007    4.0 x 15 mm Vision BMS to mCFX  . History of nuclear stress test 2014    exercise myoview; perfusion defect consistent with infarct/scar (basal/mid/apical inferior regions); low risk scan    Past Surgical History  Procedure Laterality Date  . Coronary angioplasty with stent placement  03/2007    4.0 x 15-mm Vision BMS mCFX; 60% residual LAD  . Transthoracic echocardiogram  04/2007    EF >55%; mild concentric LVH;  borderline RV enlargement; RA & LA mildly dilated; mild MR, mild TR, mild PV regurg  . Knee arthroscopy Left     x 2  . Cardiac catheterization  2010    [place], LAD 60%, CFX 60-70%, stent OK, RCA OK, EF 60%   Family History  Problem Relation Age of Onset  . Cancer - Prostate Father    History  Substance Use Topics  . Smoking status: Former Smoker -- 1.00 packs/day    Types: Cigarettes    Quit date: 03/25/2007  . Smokeless tobacco: Never Used  . Alcohol Use: 0.0 oz/week    0 Standard drinks or equivalent per week     Comment: beer occasionally     Review of Systems  Constitutional: Negative for fever and chills.  HENT: Negative for congestion.   Eyes: Negative for visual disturbance.  Respiratory: Negative for shortness of breath.   Cardiovascular: Positive for chest pain.  Gastrointestinal: Negative for vomiting and abdominal pain.  Genitourinary: Negative for dysuria and flank pain.  Musculoskeletal: Negative for back pain, neck pain and neck stiffness.  Skin: Negative for rash.  Neurological: Negative for light-headedness and headaches.      Allergies  Review of patient's allergies indicates no known allergies.  Home Medications   Prior to Admission medications   Medication Sig Start Date End Date Taking? Authorizing Provider  aspirin EC 325 MG tablet Take 325 mg by mouth daily.   Yes Historical Provider, MD  atorvastatin (LIPITOR) 80 MG tablet Take 80 mg by mouth daily.   Yes  Historical Provider, MD  carvedilol (COREG) 12.5 MG tablet Take 12.5 mg by mouth 2 (two) times daily with a meal.   Yes Historical Provider, MD  ibuprofen (ADVIL,MOTRIN) 200 MG tablet Take 600 mg by mouth every 6 (six) hours as needed (pain).   Yes Historical Provider, MD  losartan (COZAAR) 100 MG tablet Take 100 mg by mouth daily.   Yes Historical Provider, MD  pantoprazole (PROTONIX) 40 MG tablet Take 40 mg by mouth daily as needed (acid reflux).    Yes Historical Provider, MD   triamterene-hydrochlorothiazide (MAXZIDE-25) 37.5-25 MG per tablet Take 1 tablet by mouth daily.    Yes Historical Provider, MD   BP 111/68 mmHg  Pulse 62  Resp 24  Ht 6' (1.829 m)  Wt 244 lb (110.678 kg)  BMI 33.09 kg/m2  SpO2 99% Physical Exam  Constitutional: He is oriented to person, place, and time. He appears well-developed and well-nourished.  HENT:  Head: Normocephalic and atraumatic.  Eyes: Conjunctivae are normal. Right eye exhibits no discharge. Left eye exhibits no discharge.  Neck: Normal range of motion. Neck supple. No tracheal deviation present.  Cardiovascular: Normal rate, regular rhythm and intact distal pulses.   Pulmonary/Chest: Effort normal and breath sounds normal.  Abdominal: Soft. He exhibits no distension. There is no tenderness. There is no guarding.  Musculoskeletal: He exhibits no edema or tenderness.  Neurological: He is alert and oriented to person, place, and time.  Skin: Skin is warm. No rash noted.  Psychiatric: He has a normal mood and affect.  Nursing note and vitals reviewed.   ED Course  Procedures (including critical care time) Labs Review Labs Reviewed  BASIC METABOLIC PANEL - Abnormal; Notable for the following:    Creatinine, Ser 1.29 (*)    Calcium 8.6 (*)    GFR calc non Af Amer 58 (*)    All other components within normal limits  I-STAT CHEM 8, ED - Abnormal; Notable for the following:    Creatinine, Ser 1.30 (*)    Calcium, Ion 1.11 (*)    All other components within normal limits  CBC  DIFFERENTIAL  PROTIME-INR  APTT  HEPARIN LEVEL (UNFRACTIONATED)  I-STAT TROPOININ, ED  I-STAT TROPOININ, ED    Imaging Review No results found.   EKG Interpretation None      MDM   Final diagnoses:  Angina effort   Patient presents with concern for acute heart attack, patient chest pain-free in the ER, code STEMI canceled after discussing case with on-call interventional cardiologist. Patient had aspirin prior to arrival.  Cardiac screen done concern for angina. Discussed with cardiology who will evaluate disposition the patient.  The patients results and plan were reviewed and discussed.   Any x-rays performed were independently reviewed by myself.   Differential diagnosis were considered with the presenting HPI.  Medications  aspirin chewable tablet 324 mg (324 mg Oral Not Given 07/19/15 1433)  heparin ADULT infusion 100 units/mL (25000 units/250 mL) (1,400 Units/hr Intravenous New Bag/Given 07/19/15 1533)  aspirin EC tablet 81 mg (not administered)  atorvastatin (LIPITOR) tablet 80 mg (not administered)  carvedilol (COREG) tablet 12.5 mg (not administered)  pantoprazole (PROTONIX) EC tablet 40 mg (not administered)  heparin bolus via infusion 4,000 Units (4,000 Units Intravenous Given 07/19/15 1539)    Filed Vitals:   07/19/15 1513 07/19/15 1515 07/19/15 1530 07/19/15 1545  BP:  119/68 111/68 111/68  Pulse:  66 71 62  Resp:  Height: 6' (1.829 m)  Weight: 244 lb (110.678 kg)     SpO2:  99% 99% 99%    Final diagnoses:  Angina effort         Blane Ohara, MD 07/19/15 1554

## 2015-07-19 NOTE — Progress Notes (Signed)
ANTICOAGULATION CONSULT NOTE - Initial Consult  Pharmacy Consult for heparin Indication: chest pain/ACS  No Known Allergies  Patient Measurements: Height: 6' (182.9 cm) Weight: 244 lb (110.678 kg) IBW/kg (Calculated) : 77.6 Heparin dosing wt: 101kg  Vital Signs:    Labs:  Recent Labs  07/19/15 1420 07/19/15 1437  HGB 13.6 14.6  HCT 41.2 43.0  PLT 150  --   CREATININE  --  1.30*    Estimated Creatinine Clearance: 75.7 mL/min (by C-G formula based on Cr of 1.3).   Medical History: Past Medical History  Diagnosis Date  . CAD (coronary artery disease)     cath 03/2009 demonstrated patent stent to circumflex, 60% LAD stenosis  . Hypertension   . Dyslipidemia   . Metabolic syndrome   . History of non-ST elevation myocardial infarction (NSTEMI) 03/2007    stenting to circumflex  . History of nuclear stress test 08/2011    exercise myoview; perfusion defect consistent with infarct/scar (basal/mid/apical inferior regions); low risk scan     Assessment: 64 yo male here with CP and noted with history of CAD with stent in 2008. Pharmacy has been consulted to dose heparin.   Goal of Therapy:  Heparin level 0.3-0.7 units/ml Monitor platelets by anticoagulation protocol: Yes   Plan:  -Heparin bolus 4000 units IV followed by 1400 units/hr (~ 14 units/kg/hr) -Heparin level in 6 hours and daily wth CBC daily  Harland German, Pharm D 07/19/2015 2:52 PM

## 2015-07-19 NOTE — Progress Notes (Signed)
ANTICOAGULATION CONSULT NOTE - Initial Consult  Pharmacy Consult for heparin Indication: chest pain/ACS  No Known Allergies  Patient Measurements: Height: 6' (182.9 cm) Weight: 244 lb (110.678 kg) IBW/kg (Calculated) : 77.6 Heparin dosing wt: 101kg  Vital Signs: Temp: 98.1 F (36.7 C) (07/26 2033) Temp Source: Oral (07/26 2033) BP: 107/59 mmHg (07/26 2033) Pulse Rate: 58 (07/26 2033)  Labs:  Recent Labs  07/19/15 1420 07/19/15 1437 07/19/15 1723 07/19/15 2126  HGB 13.6 14.6  --   --   HCT 41.2 43.0  --   --   PLT 150  --   --   --   APTT 30  --   --   --   LABPROT 14.6  --   --   --   INR 1.12  --   --   --   HEPARINUNFRC  --   --   --  0.48  CREATININE 1.29* 1.30*  --   --   TROPONINI  --   --  <0.03  --     Estimated Creatinine Clearance: 75.7 mL/min (by C-G formula based on Cr of 1.3).    Assessment: 64 yo male here with CP and noted with history of CAD with stent in 2008. Pharmacy has been consulted to dose heparin.  The first heparin level is therapeutic at 0.48.  Goal of Therapy:  Heparin level 0.3-0.7 units/ml Monitor platelets by anticoagulation protocol: Yes   Plan:  Continue heparin at 1400 units/hr (~ 14 units/kg/hr) Heparin levels and CBC daily while on heparin  Celedonio Miyamoto, PharmD, BCPS-AQ ID Clinical Pharmacist Pager [phone]   07/19/2015 10:09 PM

## 2015-07-19 NOTE — Progress Notes (Signed)
Report received from the ED at 1725 and pt arrived to the unit at 1805. Pt A&O x4; VSS; telemetry applied and verified; pt oriented to the unit and room; call light within reach; IV intact with heparin infusing upon arrival to the unit; pt sitting up in bed with call light within reach and watching TV. Will report off to oncoming RN. Arabella Merles Magali Bray RN.

## 2015-07-20 ENCOUNTER — Encounter (HOSPITAL_COMMUNITY)
Admission: EM | Disposition: A | Discharge status: Home / Self Care | Payer: Managed Care, Other (non HMO) | Source: Home / Self Care | Attending: Cardiology

## 2015-07-20 DIAGNOSIS — E785 Hyperlipidemia, unspecified: Secondary | ICD-10-CM

## 2015-07-20 DIAGNOSIS — I10 Essential (primary) hypertension: Secondary | ICD-10-CM

## 2015-07-20 DIAGNOSIS — I25110 Atherosclerotic heart disease of native coronary artery with unstable angina pectoris: Secondary | ICD-10-CM

## 2015-07-20 HISTORY — PX: CARDIAC CATHETERIZATION: SHX172

## 2015-07-20 LAB — COMPREHENSIVE METABOLIC PANEL
ALT: 29 U/L (ref 17–63)
AST: 25 U/L (ref 15–41)
Albumin: 3.5 g/dL (ref 3.5–5.0)
Alkaline Phosphatase: 57 U/L (ref 38–126)
Anion gap: 9 (ref 5–15)
BUN: 15 mg/dL (ref 6–20)
CO2: 26 mmol/L (ref 22–32)
CREATININE: 1.24 mg/dL (ref 0.61–1.24)
Calcium: 8.6 mg/dL — ABNORMAL LOW (ref 8.9–10.3)
Chloride: 102 mmol/L (ref 101–111)
GFR calc Af Amer: >60 mL/min (ref >60)
Glucose, Bld: 92 mg/dL (ref 65–99)
Potassium: 3.3 mmol/L — ABNORMAL LOW (ref 3.5–5.1)
Sodium: 137 mmol/L (ref 135–145)
Total Bilirubin: 0.7 mg/dL (ref 0.3–1.2)
Total Protein: 6.8 g/dL (ref 6.5–8.1)

## 2015-07-20 LAB — CBC
HEMATOCRIT: 38.7 % — AB (ref 39.0–52.0)
Hemoglobin: 12.8 g/dL — ABNORMAL LOW (ref 13.0–17.0)
MCH: 27.8 pg (ref 26.0–34.0)
MCHC: 33.1 g/dL (ref 30.0–36.0)
MCV: 83.9 fL (ref 78.0–100.0)
PLATELETS: 129 10*3/uL — AB (ref 150–400)
RBC: 4.61 MIL/uL (ref 4.22–5.81)
RDW: 13.5 % (ref 11.5–15.5)
WBC: 7.5 10*3/uL (ref 4.0–10.5)

## 2015-07-20 LAB — HEMOGLOBIN A1C
HEMOGLOBIN A1C: 5.9 % — AB (ref 4.8–5.6)
Mean Plasma Glucose: 123 mg/dL

## 2015-07-20 LAB — LIPID PANEL
Cholesterol: 118 mg/dL (ref 0–200)
HDL: 24 mg/dL — ABNORMAL LOW (ref >40)
LDL Cholesterol: 69 mg/dL (ref 0–99)
Total CHOL/HDL Ratio: 4.9 RATIO
Triglycerides: 126 mg/dL (ref <150)
VLDL: 25 mg/dL (ref 0–40)

## 2015-07-20 LAB — TROPONIN I

## 2015-07-20 LAB — HEPARIN LEVEL (UNFRACTIONATED): HEPARIN UNFRACTIONATED: 0.49 [IU]/mL (ref 0.30–0.70)

## 2015-07-20 LAB — POCT ACTIVATED CLOTTING TIME: ACTIVATED CLOTTING TIME: 528 s

## 2015-07-20 SURGERY — LEFT HEART CATH AND CORONARY ANGIOGRAPHY
Anesthesia: LOCAL

## 2015-07-20 MED ORDER — POTASSIUM CHLORIDE CRYS ER 20 MEQ PO TBCR
40.0000 meq | EXTENDED_RELEASE_TABLET | Freq: Once | ORAL | Status: AC
  Administered 2015-07-20: 40 meq via ORAL
  Filled 2015-07-20: qty 2

## 2015-07-20 MED ORDER — HEPARIN SODIUM (PORCINE) 1000 UNIT/ML IJ SOLN
INTRAMUSCULAR | Status: DC | PRN
  Administered 2015-07-20: 5000 [IU] via INTRAVENOUS

## 2015-07-20 MED ORDER — LIDOCAINE HCL (PF) 1 % IJ SOLN
INTRAMUSCULAR | Status: AC
  Filled 2015-07-20: qty 30

## 2015-07-20 MED ORDER — BIVALIRUDIN BOLUS VIA INFUSION - CUPID
INTRAVENOUS | Status: DC | PRN
  Administered 2015-07-20: 80.85 mg via INTRAVENOUS

## 2015-07-20 MED ORDER — PRASUGREL HCL 10 MG PO TABS
ORAL_TABLET | ORAL | Status: DC | PRN
  Administered 2015-07-20: 60 mg via ORAL

## 2015-07-20 MED ORDER — SODIUM CHLORIDE 0.9 % IV SOLN: 250.0000 mL | INTRAVENOUS | Status: DC | PRN

## 2015-07-20 MED ORDER — MIDAZOLAM HCL 2 MG/2ML IJ SOLN
INTRAMUSCULAR | Status: DC | PRN
  Administered 2015-07-20: 1 mg via INTRAVENOUS

## 2015-07-20 MED ORDER — VERAPAMIL HCL 2.5 MG/ML IV SOLN
INTRAVENOUS | Status: AC
  Filled 2015-07-20: qty 2

## 2015-07-20 MED ORDER — FENTANYL CITRATE (PF) 100 MCG/2ML IJ SOLN
INTRAMUSCULAR | Status: DC | PRN
  Administered 2015-07-20: 50 ug via INTRAVENOUS

## 2015-07-20 MED ORDER — PRASUGREL HCL 10 MG PO TABS
ORAL_TABLET | ORAL | Status: AC
  Filled 2015-07-20: qty 6

## 2015-07-20 MED ORDER — SODIUM CHLORIDE 0.9 % IJ SOLN: 3.0000 mL | Freq: Two times a day (BID) | INTRAMUSCULAR | Status: DC

## 2015-07-20 MED ORDER — SODIUM CHLORIDE 0.9 % IV SOLN
250.0000 mg | INTRAVENOUS | Status: DC | PRN
  Administered 2015-07-20: 1.75 mg/kg/h via INTRAVENOUS

## 2015-07-20 MED ORDER — MIDAZOLAM HCL 2 MG/2ML IJ SOLN
INTRAMUSCULAR | Status: AC
  Filled 2015-07-20: qty 2

## 2015-07-20 MED ORDER — ACTIVE PARTNERSHIP FOR HEALTH OF YOUR HEART BOOK
Freq: Once | Status: AC
Start: 2015-07-20 — End: 2015-07-20
  Administered 2015-07-20: 23:00:00
  Filled 2015-07-20: qty 1

## 2015-07-20 MED ORDER — PRASUGREL HCL 10 MG PO TABS
10.0000 mg | ORAL_TABLET | Freq: Every day | ORAL | Status: DC
  Administered 2015-07-21: 10 mg via ORAL
  Filled 2015-07-20: qty 1

## 2015-07-20 MED ORDER — VERAPAMIL HCL 2.5 MG/ML IV SOLN
INTRAVENOUS | Status: DC | PRN
  Administered 2015-07-20: 16:00:00 via INTRA_ARTERIAL

## 2015-07-20 MED ORDER — BIVALIRUDIN 250 MG IV SOLR
INTRAVENOUS | Status: AC
  Filled 2015-07-20: qty 250

## 2015-07-20 MED ORDER — HEPARIN SODIUM (PORCINE) 1000 UNIT/ML IJ SOLN
INTRAMUSCULAR | Status: AC
  Filled 2015-07-20: qty 1

## 2015-07-20 MED ORDER — SODIUM CHLORIDE 0.9 % IJ SOLN: 3.0000 mL | INTRAMUSCULAR | Status: DC | PRN

## 2015-07-20 MED ORDER — ANGIOPLASTY BOOK
Freq: Once | Status: AC
  Administered 2015-07-20: 23:00:00
  Filled 2015-07-20: qty 1

## 2015-07-20 MED ORDER — FENTANYL CITRATE (PF) 100 MCG/2ML IJ SOLN
INTRAMUSCULAR | Status: AC
  Filled 2015-07-20: qty 2

## 2015-07-20 MED ORDER — PRASUGREL HCL 10 MG PO TABS: 10.0000 mg | ORAL_TABLET | Freq: Every day | ORAL | Status: DC

## 2015-07-20 MED ORDER — SODIUM CHLORIDE 0.9 % IV SOLN: INTRAVENOUS | Status: AC

## 2015-07-20 SURGICAL SUPPLY — 18 items
BALLN TREK RX 3.0X12 (BALLOONS) ×2
BALLN ~~LOC~~ TREK RX 4.5X12 (BALLOONS) ×2
BALLOON TREK RX 3.0X12 (BALLOONS) IMPLANT
BALLOON ~~LOC~~ TREK RX 4.5X12 (BALLOONS) IMPLANT
CATH INFINITI 5FR ANG PIGTAIL (CATHETERS) ×2 IMPLANT
CATH OPTITORQUE JACKY 4.0 5F (CATHETERS) ×2 IMPLANT
CATH VISTA GUIDE 6FR XB3 (CATHETERS) ×1 IMPLANT
DEVICE RAD COMP TR BAND LRG (VASCULAR PRODUCTS) ×2 IMPLANT
GLIDESHEATH SLEND SS 6F .021 (SHEATH) ×2 IMPLANT
KIT ENCORE 26 ADVANTAGE (KITS) ×1 IMPLANT
KIT HEART LEFT (KITS) ×2 IMPLANT
PACK CARDIAC CATHETERIZATION (CUSTOM PROCEDURE TRAY) ×2 IMPLANT
STENT SYNERGY DES 4X16 (Permanent Stent) ×1 IMPLANT
SYR MEDRAD MARK V 150ML (SYRINGE) ×2 IMPLANT
TRANSDUCER W/STOPCOCK (MISCELLANEOUS) ×2 IMPLANT
TUBING CIL FLEX 10 FLL-RA (TUBING) ×2 IMPLANT
WIRE INTUITION PROPEL ST 180CM (WIRE) ×1 IMPLANT
WIRE SAFE-T 1.5MM-J .035X260CM (WIRE) ×2 IMPLANT

## 2015-07-20 NOTE — Interval H&P Note (Signed)
Cath Lab Visit (complete for each Cath Lab visit)  Clinical Evaluation Leading to the Procedure:   ACS: Yes.    Non-ACS:    Anginal Classification: CCS IV  Anti-ischemic medical therapy: Minimal Therapy (1 class of medications)  Non-Invasive Test Results: No non-invasive testing performed  Prior CABG: No previous CABG      History and Physical Interval Note:  07/20/2015 3:53 PM  Luis Tucker  has presented today for surgery, with the diagnosis of NSTEMI  The various methods of treatment have been discussed with the patient and family. After consideration of risks, benefits and other options for treatment, the patient has consented to  Procedure(s): Left Heart Cath and Coronary Angiography (N/A) as a surgical intervention .  The patient's history has been reviewed, patient examined, no change in status, stable for surgery.  I have reviewed the patient's chart and labs.  Questions were answered to the patient's satisfaction.     Lorine Bears

## 2015-07-20 NOTE — H&P (View-Only) (Signed)
Patient Name: Luis Tucker Date of Encounter: 07/20/2015  Principal Problem:   Crescendo angina Active Problems:   HTN (hypertension)   Hyperlipidemia   Unstable angina   Primary Cardiologist: Dr Rennis Golden  Patient Profile: 64 y.o. male with a history of CAD, HTN, HL, obesity, remote tobacco use, admitted 07/26 for exertional chest pain, cath 07/27 pm.  SUBJECTIVE: No chest pain, no SOB  OBJECTIVE Filed Vitals:   07/19/15 1806 07/19/15 2033 07/20/15 0536 07/20/15 0957  BP: 132/82 107/59 113/82 107/63  Pulse: 55 58 59 62  Temp:  98.1 F (36.7 C) 98.2 F (36.8 C)   TempSrc:  Oral Oral   Resp: Height:      Weight:   237 lb 10.5 oz (107.8 kg)   SpO2: 100% 99% 99% 99%    Intake/Output Summary (Last 24 hours) at 07/20/15 1037 Last data filed at 07/20/15 0352  Gross per 24 hour  Intake   48.3 ml  Output    303 ml  Net -254.7 ml   Filed Weights   07/19/15 1430 07/19/15 1513 07/20/15 0536  Weight: 244 lb (110.678 kg) 244 lb (110.678 kg) 237 lb 10.5 oz (107.8 kg)    PHYSICAL EXAM General: Well developed, well nourished, male in no acute distress. Head: Normocephalic, atraumatic.  Neck: Supple without bruits, JVD not elevated. Lungs:  Resp regular and unlabored, few dry rales. Heart: RRR, S1, S2, no S3, S4, or murmur; no rub. Abdomen: Soft, non-tender, non-distended, BS + x 4.  Extremities: No clubbing, cyanosis, edema.  Neuro: Alert and oriented X 3. Moves all extremities spontaneously. Psych: Normal affect.  LABS: CBC: Recent Labs  07/19/15 1420 07/19/15 1437 07/20/15 0344  WBC 9.9  --  7.5  NEUTROABS 6.6  --   --   HGB 13.6 14.6 12.8*  HCT 41.2 43.0 38.7*  MCV 83.7  --  83.9  PLT 150  --  129*   INR: Recent Labs  07/19/15 1420  INR 1.12   Basic Metabolic Panel: Recent Labs  07/19/15 1420 07/19/15 1437 07/20/15 0344  NA 135 139 137  K 3.7 3.7 3.3*  CL 102 102 102  CO2 25  --  26  GLUCOSE 92 95 92  BUN CREATININE  1.29* 1.30* 1.24  CALCIUM 8.6*  --  8.6*   Liver Function Tests: Recent Labs  07/20/15 0344  AST 25  ALT 29  ALKPHOS 57  BILITOT 0.7  PROT 6.8  ALBUMIN 3.5   Cardiac Enzymes: Recent Labs  07/19/15 1723 07/19/15 2255 07/20/15 0344  TROPONINI <0.03 <0.03 <0.03    Recent Labs  07/19/15 1436  TROPIPOC 0.00   Hemoglobin A1C: Recent Labs  07/19/15 1723  HGBA1C 5.9*   Fasting Lipid Panel: Recent Labs  07/20/15 0344  CHOL 118  HDL 24*  LDLCALC 69  TRIG 865  CHOLHDL 4.9   Thyroid Function Tests: Recent Labs  07/19/15 1723  TSH 2.608   TELE:  SR, sinus brady  Radiology/Studies: No results found.   Current Medications:  . aspirin  324 mg Oral Once  . aspirin EC  81 mg Oral Daily  . atorvastatin  80 mg Oral Daily  . carvedilol  12.5 mg Oral BID WC  . sodium chloride  3 mL Intravenous Q12H  . sodium chloride  3 mL Intravenous Q12H   . sodium chloride 1 mL/kg/hr (07/20/15 0700)  . heparin 1,400 Units/hr (07/20/15 0543)  ASSESSMENT AND PLAN: Principal Problem:   Crescendo angina - ez neg MI, for cath today - pain-free on current rx - rx held and hydrated, Cr improved  Active Problems:   HTN (hypertension) - currently good control    Hyperlipidemia - LDL is low - continue statin    Unstable angina - see above    Mildly elevated A1c - blood sugars are OK - HH diet, decrease carbs    Hypokalemia - supplemented  Signed, Barrett, Rhonda , PA-C 10:37 AM 07/20/2015  I have examined the patient and reviewed assessment and plan and discussed with patient.  Agree with above as stated.  Patient may need arthroscopy for torn meniscus.  If DES thought to be required, would lean towards a SYnergy stent since Plavix may be easier to stop early.  Azalee Weimer S.

## 2015-07-20 NOTE — Progress Notes (Signed)
Pt transported off unit to cath lab for procedure. P. Amo Laurine Kuyper RN 

## 2015-07-20 NOTE — Progress Notes (Signed)
TR BAND REMOVAL  LOCATION:    right radial  DEFLATED PER PROTOCOL:    Yes.    TIME BAND OFF / DRESSING APPLIED:    21:00   SITE UPON ARRIVAL:    Level 0  SITE AFTER BAND REMOVAL:    Level 0  REVERSE ALLEN'S TEST:     positive  CIRCULATION SENSATION AND MOVEMENT:    Within Normal Limits   Yes.    COMMENTS:   Pt tolerated removal of TR band without complication, will continue to monitor radial site and patient.

## 2015-07-20 NOTE — Progress Notes (Addendum)
ANTICOAGULATION CONSULT NOTE - Follow Up Consult  Pharmacy Consult for heparin  Indication: chest pain/ACS  No Known Allergies  Patient Measurements: Height: 6' (182.9 cm) Weight: 237 lb 10.5 oz (107.8 kg) IBW/kg (Calculated) : 77.6 Heparin Dosing Weight:   Vital Signs: Temp: 98.2 F (36.8 C) (07/27 0536) Temp Source: Oral (07/27 0536) BP: 107/63 mmHg (07/27 0957) Pulse Rate: 62 (07/27 0957)  Labs:  Recent Labs  07/19/15 1420 07/19/15 1437 07/19/15 1723 07/19/15 2126 07/19/15 2255 07/20/15 0344  HGB 13.6 14.6  --   --   --  12.8*  HCT 41.2 43.0  --   --   --  38.7*  PLT 150  --   --   --   --  129*  APTT 30  --   --   --   --   --   LABPROT 14.6  --   --   --   --   --   INR 1.12  --   --   --   --   --   HEPARINUNFRC  --   --   --  0.48  --  0.49  CREATININE 1.29* 1.30*  --   --   --  1.24  TROPONINI  --   --  <0.03  --  <0.03 <0.03    Estimated Creatinine Clearance: 78.4 mL/min (by C-G formula based on Cr of 1.24).   Medications:  Scheduled:  . aspirin  324 mg Oral Once  . aspirin EC  81 mg Oral Daily  . atorvastatin  80 mg Oral Daily  . carvedilol  12.5 mg Oral BID WC  . sodium chloride  3 mL Intravenous Q12H  . sodium chloride  3 mL Intravenous Q12H   Infusions:  . sodium chloride 1 mL/kg/hr (07/20/15 0700)  . heparin 1,400 Units/hr (07/20/15 0543)   PRN: sodium chloride, sodium chloride, acetaminophen, ALPRAZolam, nitroGLYCERIN, ondansetron (ZOFRAN) IV, pantoprazole, sodium chloride, sodium chloride, zolpidem  Assessment: 20 YOM admitted with CP and noted with history of CAD with stent in 2008. Pharmacy has been consulted to dose heparin.  7/27- HL therapeutic x 2 - 0.48>0.49 Hgb 12.8, Plts 129, no signs/sx of bleeding noted.   Cath scheduled for 7/27 1600   Goal of Therapy:   Heparin level 0.3-0.7 units/ml Monitor platelets by anticoagulation protocol: Yes   Plan:  Continue heparin gtt at 1400 units/hr Daily CBC/HL Monitor for s/sx of  bleeding.   Synetta Fail, PharmD Pharmacy Resident 07/20/2015,10:40 AM

## 2015-07-20 NOTE — Progress Notes (Signed)
   Patient Name: Luis Tucker Date of Encounter: 07/20/2015  Principal Problem:   Crescendo angina Active Problems:   HTN (hypertension)   Hyperlipidemia   Unstable angina   Primary Cardiologist: Dr Hilty  Patient Profile: 64 y.o. male with a history of CAD, HTN, HL, obesity, remote tobacco use, admitted 07/26 for exertional chest pain, cath 07/27 pm.  SUBJECTIVE: No chest pain, no SOB  OBJECTIVE Filed Vitals:   07/19/15 1806 07/19/15 2033 07/20/15 0536 07/20/15 0957  BP: 132/82 107/59 113/82 107/63  Pulse: 55 58 59 62  Temp:  98.1 F (36.7 C) 98.2 F (36.8 C)   TempSrc:  Oral Oral   Resp: 15 16 18 18  Height:      Weight:   237 lb 10.5 oz (107.8 kg)   SpO2: 100% 99% 99% 99%    Intake/Output Summary (Last 24 hours) at 07/20/15 1037 Last data filed at 07/20/15 0352  Gross per 24 hour  Intake   48.3 ml  Output    303 ml  Net -254.7 ml   Filed Weights   07/19/15 1430 07/19/15 1513 07/20/15 0536  Weight: 244 lb (110.678 kg) 244 lb (110.678 kg) 237 lb 10.5 oz (107.8 kg)    PHYSICAL EXAM General: Well developed, well nourished, male in no acute distress. Head: Normocephalic, atraumatic.  Neck: Supple without bruits, JVD not elevated. Lungs:  Resp regular and unlabored, few dry rales. Heart: RRR, S1, S2, no S3, S4, or murmur; no rub. Abdomen: Soft, non-tender, non-distended, BS + x 4.  Extremities: No clubbing, cyanosis, edema.  Neuro: Alert and oriented X 3. Moves all extremities spontaneously. Psych: Normal affect.  LABS: CBC: Recent Labs  07/19/15 1420 07/19/15 1437 07/20/15 0344  WBC 9.9  --  7.5  NEUTROABS 6.6  --   --   HGB 13.6 14.6 12.8*  HCT 41.2 43.0 38.7*  MCV 83.7  --  83.9  PLT 150  --  129*   INR: Recent Labs  07/19/15 1420  INR 1.12   Basic Metabolic Panel: Recent Labs  07/19/15 1420 07/19/15 1437 07/20/15 0344  NA 135 139 137  K 3.7 3.7 3.3*  CL 102 102 102  CO2 25  --  26  GLUCOSE 92 95 92  BUN 15 19 15  CREATININE  1.29* 1.30* 1.24  CALCIUM 8.6*  --  8.6*   Liver Function Tests: Recent Labs  07/20/15 0344  AST 25  ALT 29  ALKPHOS 57  BILITOT 0.7  PROT 6.8  ALBUMIN 3.5   Cardiac Enzymes: Recent Labs  07/19/15 1723 07/19/15 2255 07/20/15 0344  TROPONINI <0.03 <0.03 <0.03    Recent Labs  07/19/15 1436  TROPIPOC 0.00   Hemoglobin A1C: Recent Labs  07/19/15 1723  HGBA1C 5.9*   Fasting Lipid Panel: Recent Labs  07/20/15 0344  CHOL 118  HDL 24*  LDLCALC 69  TRIG 126  CHOLHDL 4.9   Thyroid Function Tests: Recent Labs  07/19/15 1723  TSH 2.608   TELE:  SR, sinus brady  Radiology/Studies: No results found.   Current Medications:  . aspirin  324 mg Oral Once  . aspirin EC  81 mg Oral Daily  . atorvastatin  80 mg Oral Daily  . carvedilol  12.5 mg Oral BID WC  . sodium chloride  3 mL Intravenous Q12H  . sodium chloride  3 mL Intravenous Q12H   . sodium chloride 1 mL/kg/hr (07/20/15 0700)  . heparin 1,400 Units/hr (07/20/15 0543)      ASSESSMENT AND PLAN: Principal Problem:   Crescendo angina - ez neg MI, for cath today - pain-free on current rx - rx held and hydrated, Cr improved  Active Problems:   HTN (hypertension) - currently good control    Hyperlipidemia - LDL is low - continue statin    Unstable angina - see above    Mildly elevated A1c - blood sugars are OK - HH diet, decrease carbs    Hypokalemia - supplemented  Signed, Barrett, Rhonda , PA-C 10:37 AM 07/20/2015  I have examined the patient and reviewed assessment and plan and discussed with patient.  Agree with above as stated.  Patient may need arthroscopy for torn meniscus.  If DES thought to be required, would lean towards a SYnergy stent since Plavix may be easier to stop early.  VARANASI,JAYADEEP S.  

## 2015-07-20 NOTE — Progress Notes (Signed)
Ate turkey sandwich 

## 2015-07-21 ENCOUNTER — Encounter (HOSPITAL_COMMUNITY): Payer: Self-pay | Admitting: Cardiovascular Disease

## 2015-07-21 LAB — CBC
HEMATOCRIT: 37.5 % — AB (ref 39.0–52.0)
HEMOGLOBIN: 12.6 g/dL — AB (ref 13.0–17.0)
MCH: 28 pg (ref 26.0–34.0)
MCHC: 33.6 g/dL (ref 30.0–36.0)
MCV: 83.3 fL (ref 78.0–100.0)
Platelets: 132 10*3/uL — ABNORMAL LOW (ref 150–400)
RBC: 4.5 MIL/uL (ref 4.22–5.81)
RDW: 13.3 % (ref 11.5–15.5)
WBC: 6.7 10*3/uL (ref 4.0–10.5)

## 2015-07-21 LAB — BASIC METABOLIC PANEL
ANION GAP: 7 (ref 5–15)
BUN: 16 mg/dL (ref 6–20)
CALCIUM: 8.6 mg/dL — AB (ref 8.9–10.3)
CO2: 25 mmol/L (ref 22–32)
CREATININE: 1.23 mg/dL (ref 0.61–1.24)
Chloride: 108 mmol/L (ref 101–111)
GFR calc non Af Amer: >60 mL/min (ref >60)
GLUCOSE: 77 mg/dL (ref 65–99)
Potassium: 3.5 mmol/L (ref 3.5–5.1)
Sodium: 140 mmol/L (ref 135–145)

## 2015-07-21 MED ORDER — LOSARTAN POTASSIUM 25 MG PO TABS: 25.0000 mg | ORAL_TABLET | Freq: Every day | ORAL | Status: DC

## 2015-07-21 MED ORDER — PRASUGREL HCL 10 MG PO TABS: 10.0000 mg | ORAL_TABLET | Freq: Every day | ORAL | Status: DC

## 2015-07-21 MED ORDER — LOSARTAN POTASSIUM 50 MG PO TABS
25.0000 mg | ORAL_TABLET | Freq: Every day | ORAL | Status: DC
  Administered 2015-07-21: 25 mg via ORAL
  Filled 2015-07-21: qty 1

## 2015-07-21 MED ORDER — ASPIRIN EC 81 MG PO TBEC: 81.0000 mg | DELAYED_RELEASE_TABLET | Freq: Every day | ORAL | Status: DC

## 2015-07-21 MED ORDER — NITROGLYCERIN 0.4 MG SL SUBL: 0.4000 mg | SUBLINGUAL_TABLET | SUBLINGUAL | Status: DC | PRN

## 2015-07-21 MED FILL — Lidocaine HCl Local Preservative Free (PF) Inj 1%: INTRAMUSCULAR | Qty: 30 | Status: AC

## 2015-07-21 NOTE — Progress Notes (Signed)
Pt given all dischg instructions and questions were answered. Paper Prescription for effient given as well as note for work.  All belongings with pt.  No chest pain or sob at time of discharge.

## 2015-07-21 NOTE — Progress Notes (Signed)
CARDIAC REHAB PHASE I   PRE:  Rate/Rhythm: 72 SR    BP: sitting 126/71    SaO2:   MODE:  Ambulation: 550 ft   POST:  Rate/Rhythm: 97 SR    BP: sitting 154/83     SaO2:   Tolerated well, no c/o. Ed completed. Encouraged pt to decrease sugars as his A1C is 5.9. Admits to sweets and soft drinks on the road. 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 Gave Effient brochure and pt understands importance.  [id number]   Elissa Lovett [place] CES, ACSM 07/21/2015 9:28 AM

## 2015-07-21 NOTE — Progress Notes (Signed)
Patient: Luis Tucker / Admit Date: 07/19/2015 / Date of Encounter: 07/21/2015, 8:12 AM   Subjective: Feeling great. No CP or SOB.   Objective: Telemetry: NSR, brief SB overnight (40s) Physical Exam: Blood pressure 108/58, pulse 70, temperature 98.1 F (36.7 C), temperature source Oral, resp. rate 17, height 6' (1.829 m), weight 237 lb 10.5 oz (107.8 kg), SpO2 95 %. General: Well developed, well nourished M in no acute distress. Head: Normocephalic, atraumatic, sclera non-icteric, no xanthomas, nares are without discharge. Neck: Negative for carotid bruits. JVP not elevated. Lungs: Clear bilaterally to auscultation without wheezes, rales, or rhonchi. Breathing is unlabored. Heart: RRR S1 S2 without murmurs, rubs, or gallops.  Abdomen: Soft, non-tender, non-distended with normoactive bowel sounds. No rebound/guarding. Extremities: No clubbing or cyanosis. No edema. Distal pedal pulses are 2+ and equal bilaterally. Right radial cath site without hematoma, ecchymosis. Good pulse. Neuro: Alert and oriented X 3. Moves all extremities spontaneously. Psych:  Responds to questions appropriately with a normal affect.   Intake/Output Summary (Last 24 hours) at 07/21/15 0812 Last data filed at 07/21/15 0600  Gross per 24 hour  Intake 1774.9 ml  Output   1300 ml  Net  474.9 ml    Inpatient Medications:  . aspirin  324 mg Oral Once  . aspirin EC  81 mg Oral Daily  . atorvastatin  80 mg Oral Daily  . carvedilol  12.5 mg Oral BID WC  . prasugrel  10 mg Oral Daily  . sodium chloride  3 mL Intravenous Q12H  . sodium chloride  3 mL Intravenous Q12H   Infusions:    Labs:  Recent Labs  07/20/15 0344 07/21/15 0310  NA 137 140  K 3.3* 3.5  CL 102 108  CO2 26 25  GLUCOSE 92 77  BUN 15 16  CREATININE 1.24 1.23  CALCIUM 8.6* 8.6*    Recent Labs  07/20/15 0344  AST 25  ALT 29  ALKPHOS 57  BILITOT 0.7  PROT 6.8  ALBUMIN 3.5    Recent Labs  07/19/15 1420  07/20/15 0344  07/21/15 0310  WBC 9.9  --  7.5 6.7  NEUTROABS 6.6  --   --   --   HGB 13.6  < > 12.8* 12.6*  HCT 41.2  < > 38.7* 37.5*  MCV 83.7  --  83.9 83.3  PLT 150  --  129* 132*  < > = values in this interval not displayed.  Recent Labs  07/19/15 1723 07/19/15 2255 07/20/15 0344  TROPONINI <0.03 <0.03 <0.03   Invalid input(s): POCBNP  Recent Labs  07/19/15 1723  HGBA1C 5.9*     Radiology/Studies:  No results found.   Assessment and Plan   1. Unstable angina/CAD - (h/o stent to Cx 2008) - severe ostial LCx disease s/p angioplasty/Synergy stent placement with moderate prox LAD stenosis. LAD disease did not appear to be different than recent path. Patent mid LCx stent. Normal EF and LVEDP. DAPT for at least 1 year recommended.  2. Probable CKD stage III - Losartan and triamterene/HCTZ held on admission due to Cr 1.29, which is stable since cath. Prior Cr 1.46 in 2011, likely indicating CKD stage III. Blood pressures in the 100-130 range systolic. Will d/w MD. May hold off resuming these things until seen in the office since BP controlled since 7/26 without them.  2. HTN  - see above.  3. HLD - continue statin.  4. Hypokalemia - plan for standing K repletion will depend on  whether we resume triamterene/HCTZ which was likely the culprit for his hypokalemia. 5. Pre-diabetes - will need to follow up with PCP for monitoring. 6. Mild anemia/thrombocytopenia - f/u PCP. No bleeding.  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  Signed, Ronie Spies PA-C Pager: [phone]   I have examined the patient and reviewed assessment and plan and discussed with patient.  Agree with above as stated.  BP starting to increase.  Up to 154 systolic. WIll add Losartan 25 mg daily.  He was on 100 mg dialy in the past but other BP readings were well controlled.  May need to increase losartan as an outpatient. Spoke at length about exercise and weight loss as well as diet control.   Decrease sugar intake.   Marikay Roads S.

## 2015-07-21 NOTE — Discharge Summary (Signed)
Discharge Summary   Patient ID: Luis Tucker MRN: [id number], DOB/AGE: [id number] y.o. Admit date: 07/19/2015 D/C date:     07/21/2015  Primary Care Provider: Arlan Organ., MD Primary Cardiologist: [facility]   Primary Discharge Diagnoses:  1. Unstable angina/CAD  - (h/o stent to Cx 2008)  - severe ostial LCx disease s/p angioplasty/Synergy stent placement with moderate prox LAD stenosis. LAD disease did not appear to be different than recent cath. Patent mid LCx stent. Normal EF and LVEDP.  2. Probable CKD stage III 2. HTN 3. Dyslipidemia 4. Hypokalemia  5. Pre-diabetes 6. Mild anemia/thrombocytopenia without evidence of bleeding  Secondary Discharge Diagnoses:  1. Hx metabolic syndrome 2. Hx GERD 3. Hx hepatitis  Hospital Course: Mr. Kinne is a 64 y/o M with history of CAD s/p stent to Cx 2008, HTN, HL, obesity, remote tobacco use who presented to [facility] with worsening chest pain concerning for angina. His last stress test was in 2014, and showed inferior scar, no ischemia and a preserved EF. He has recently developed consistent exertional chest pain. Due to worsening symptoms he initially presented to urgent care and was referred to the ER. His EKG showed SR, J point elevation and inferolateral T waves different from previous ECGs. He was admitted for further evaluation and placed on IV heparin. His home triamterene/HCTZ and losartan were held on admission due to Cr 1.29. Upon further review it appears prior Cr in 2011 was 1.46 so he likely has CKD stage III. Troponins remained negative. LDL 69. Had hypokalemia which required repletion (likely r/t prior HCTZ use). He underwent cardiac cath yesterday showing severe ostial LCx disease s/p angioplasty/Synergy stent placement with moderate prox LAD stenosis. LAD disease did not appear to be different than recent path. Patent mid LCx stent. Normal EF and LVEDP. DAPT for at least 1 year recommended - started on Effient and received  30 day free card as well. He tolerated this procedure well. Post cath Cr was stable at 1.23. His blood pressure tended to run low 108-150s without the previously 2 held medicines - we resumed cozaar at lower dose on day of discharge (25mg  instead of prior 100mg  daily). We will continue to hold the triamterene/HCTZ as his blood pressure doesn't look it it needs it at this point. He was asked to follow his BP at home and call if running higher than 130/80. He had mild anemia and thrombocytopenia by day of discharge but no evidence of bleeding, may be post-procedurally related (Hgb 12.6, plt 132) - was asked to f/u with PCP to follow. He was also asked to f/u PCP for monitoring of pre-DM A1C 5.9. He was asked not to return to work until cleared at f/u appt. He drives a big rig. Dr. Eldridge Dace has seen and examined the patient today and feels he is stable for discharge.    May consider f/u BMET at office visit to ensure stability of his potassium. His lower level was likely due to HCTZ use, which has been stopped as above.   Discharge Vitals: Blood pressure 126/75, pulse 79, temperature 98.1 F (36.7 C), temperature source Oral, resp. rate 17, height 6' (1.829 m), weight 237 lb 10.5 oz (107.8 kg), SpO2 95 %.  Labs: Lab Results  Component Value Date   WBC 6.7 07/21/2015   HGB 12.6* 07/21/2015   HCT 37.5* 07/21/2015   MCV 83.3 07/21/2015   PLT 132* 07/21/2015    Recent Labs Lab 07/20/15 0344 07/21/15 0310  NA 137 140  K 3.3* 3.5  CL 102 108  CO2 26 25  BUN 15 16  CREATININE 1.24 1.23  CALCIUM 8.6* 8.6*  PROT 6.8  --   BILITOT 0.7  --   ALKPHOS 57  --   ALT 29  --   AST 25  --   GLUCOSE 92 77    Recent Labs  07/19/15 1723 07/19/15 2255 07/20/15 0344  TROPONINI <0.03 <0.03 <0.03   Lab Results  Component Value Date   CHOL 118 07/20/2015   HDL 24* 07/20/2015   LDLCALC 69 07/20/2015   TRIG 126 07/20/2015   No results found for: DDIMER  Diagnostic Studies/Procedures   Cardiac  catheterization this admission, please see full report and above for summary.   Discharge Medications   Current Discharge Medication List    START taking these medications   Details  nitroGLYCERIN (NITROSTAT) 0.4 MG SL tablet Place 1 tablet (0.4 mg total) under the tongue every 5 (five) minutes as needed for chest pain (up to 3 doses). Qty: 25 tablet, Refills: 3    prasugrel (EFFIENT) 10 MG TABS tablet Take 1 tablet (10 mg total) by mouth daily. Qty: 30 tablet, Refills: 11      CONTINUE these medications which have CHANGED   Details  aspirin EC 81 MG tablet Take 1 tablet (81 mg total) by mouth daily. Qty: 30 tablet, Refills: 11    losartan (COZAAR) 25 MG tablet Take 1 tablet (25 mg total) by mouth daily. Qty: 30 tablet, Refills: 6      CONTINUE these medications which have NOT CHANGED   Details  atorvastatin (LIPITOR) 80 MG tablet Take 80 mg by mouth daily.    carvedilol (COREG) 12.5 MG tablet Take 12.5 mg by mouth 2 (two) times daily with a meal.    pantoprazole (PROTONIX) 40 MG tablet Take 40 mg by mouth daily as needed (acid reflux).       STOP taking these medications     ibuprofen (ADVIL,MOTRIN) 200 MG tablet      triamterene-hydrochlorothiazide (MAXZIDE-25) 37.5-25 MG per tablet         Disposition   The patient will be discharged in stable condition to home. Discharge Instructions    Diet - low sodium heart healthy    Complete by:  As directed      Increase activity slowly    Complete by:  As directed   No personal driving for 2 days. No lifting over 5 lbs for 1 week. No sexual activity for 1 week. You may not return to work until cleared by your cardiologist. Keep procedure site clean & dry. If you notice increased pain, swelling, bleeding or pus, call/return!  You may shower, but no soaking baths/hot tubs/pools for 1 week.   Please monitor your blood pressure occasionally at home. Call your doctor if you tend to get readings of greater than 130 on the top  number or 80 on the bottom number.  Patients taking aspirin and Effient should generally stay away from medicines like ibuprofen, Advil, Motrin, naproxen, and Aleve due to risk of stomach bleeding. You may take Tylenol as directed or talk to your primary doctor about alternatives.  New medicines include Effient and nitroglycerin. The doses of your losartan and aspirin have changed (new tablet type and prescription). Your triamterene/hydrochlorothiazide was stopped (Maxzide).          Follow-up Information    Follow up with Norma Fredrickson, NP.   Specialties:  Nurse  Practitioner, Interventional Cardiology, Cardiology, Radiology   Why:  [facility] - 07/29/15 at 10am with Norma Fredrickson, one of our nurse practitioners. Note: this is in the [facility] location. Our [place] location did not have any available appointments.   Contact information:   [address] [place] [address] [phone]       Follow up with MANNING, Adelene Amas., MD.   Specialty:  Family Medicine   Why:  Follow up with your primary care doctor to monitor your blood sugar. Your level showed you are pre-diabetic. You may also need repeat check of your blood count/platelet count since they were mildly decreased in the hospital.   Contact information:   [address] [place] [address] [phone]         Duration of Discharge Encounter: Greater than 30 minutes including physician and PA time.  Signed, Kriste Basque, Dunn PA-C 07/21/2015, 10:16 AM  I have examined the patient and reviewed assessment and plan and discussed with patient.  Agree with above as stated.  Stressed importance of DAPT and RF modification. Please see my earlier note from today.  Marquize Seib S.

## 2015-07-29 ENCOUNTER — Encounter: Payer: Self-pay | Admitting: Nurse Practitioner

## 2015-07-29 ENCOUNTER — Ambulatory Visit (INDEPENDENT_AMBULATORY_CARE_PROVIDER_SITE_OTHER): Payer: Managed Care, Other (non HMO) | Admitting: Nurse Practitioner

## 2015-07-29 VITALS — BP 100/80 | HR 71 | Ht 72.0 in | Wt 239.2 lb

## 2015-07-29 DIAGNOSIS — I2510 Atherosclerotic heart disease of native coronary artery without angina pectoris: Secondary | ICD-10-CM | POA: Diagnosis not present

## 2015-07-29 DIAGNOSIS — I10 Essential (primary) hypertension: Secondary | ICD-10-CM

## 2015-07-29 DIAGNOSIS — E785 Hyperlipidemia, unspecified: Secondary | ICD-10-CM | POA: Diagnosis not present

## 2015-07-29 LAB — BASIC METABOLIC PANEL
BUN: 20 mg/dL (ref 6–23)
CO2: 28 mEq/L (ref 19–32)
Calcium: 9.4 mg/dL (ref 8.4–10.5)
Chloride: 104 mEq/L (ref 96–112)
Creatinine, Ser: 1.25 mg/dL (ref 0.40–1.50)
GFR: 75.18 mL/min (ref >60.00)
Glucose, Bld: 103 mg/dL — ABNORMAL HIGH (ref 70–99)
Potassium: 4 mEq/L (ref 3.5–5.1)
Sodium: 138 mEq/L (ref 135–145)

## 2015-07-29 LAB — CBC
HCT: 41.5 % (ref 39.0–52.0)
Hemoglobin: 13.8 g/dL (ref 13.0–17.0)
MCHC: 33.3 g/dL (ref 30.0–36.0)
MCV: 83.3 fl (ref 78.0–100.0)
Platelets: 180 10*3/uL (ref 150.0–400.0)
RBC: 4.98 Mil/uL (ref 4.22–5.81)
RDW: 13.2 % (ref 11.5–15.5)
WBC: 6.4 10*3/uL (ref 4.0–10.5)

## 2015-07-29 MED ORDER — PANTOPRAZOLE SODIUM 40 MG PO TBEC: 40.0000 mg | DELAYED_RELEASE_TABLET | Freq: Every day | ORAL | Status: DC | PRN

## 2015-07-29 NOTE — Progress Notes (Signed)
CARDIOLOGY OFFICE NOTE  Date:  07/29/2015    Luis Tucker Date of Birth: 1951-03-19 Medical Record #[id number]  PCP:  Arlan Organ., MD  Cardiologist:  [facility]    Chief Complaint  Patient presents with  . Coronary Artery Disease    Post op visit - seen for Dr. Rennis Golden    History of Present Illness: Luis Tucker is a 64 y.o. male who presents today for a post hospital visit. Seen for Dr. Rennis Golden. He has a history of CAD s/p stent to Cx 2008, HTN, HL, obesity, and remote tobacco use. His last stress test was in 2014, and showed inferior scar, no ischemia and a preserved EF.  Last seen by Dr. Rennis Golden in 2014.  He presented last week to [facility] with worsening chest pain concerning for angina.   His EKG showed SR, J point elevation and inferolateral T waves different from previous ECGs. He was admitted for further evaluation and placed on IV heparin. His home triamterene/HCTZ and losartan were held on admission due to Cr 1.29. Upon further review it appears prior Cr in 2011 was 1.46 so he likely has CKD stage III. Troponins remained negative. LDL 69. Had hypokalemia which required repletion (likely r/t prior HCTZ use). He underwent cardiac cath showing severe ostial LCx disease s/p angioplasty/Synergy stent placement with moderate prox LAD stenosis. LAD disease did not appear to be different than recent path. Patent mid LCx stent. Normal EF and LVEDP. DAPT for at least 1 year recommended - started on Effient.    Comes in today. Here alone. Wanting to return to work. 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 No chest pain. Not short of breath. Tolerating his medicines. Not dizzy or lightheaded.   Past Medical History  Diagnosis Date  . CAD (coronary artery disease)     a. h/o stent to Cx 2008. b. cath 03/2009 demonstrated patent stent to circumflex, 60% LAD stenosis. c. [place] 06/2015: severe ostial LCx disease s/p angioplasty/Synergy stent placement.  Moderate prox LAD disease, unchanged from prior, patent LCx stent, normal EF.  Marland Kitchen Hypertension   . Dyslipidemia   . Metabolic syndrome   . History of non-ST elevation myocardial infarction (NSTEMI) 03/2007    4.0 x 15 mm Vision BMS to mCFX  . History of nuclear stress test 2014    exercise myoview; perfusion defect consistent with infarct/scar (basal/mid/apical inferior regions); low risk scan   . GERD (gastroesophageal reflux disease)   . Hepatitis     "noncontagious type"  . CKD (chronic kidney disease), stage III   . Pre-diabetes     Past Surgical History  Procedure Laterality Date  . Transthoracic echocardiogram  04/2007    EF >55%; mild concentric LVH; borderline RV enlargement; RA & LA mildly dilated; mild MR, mild TR, mild PV regurg  . Knee arthroscopy Left 2004; 2011  . Coronary angioplasty with stent placement  03/2007    4.0 x 15-mm Vision BMS mCFX; 60% residual LAD  . Cardiac catheterization  2010    [place], LAD 60%, CFX 60-70%, stent OK, RCA OK, EF 60%  . Cardiac catheterization N/A 07/20/2015    Procedure: Left Heart Cath and Coronary Angiography;  Surgeon: Iran Ouch, MD;  [facility]: [facility] IN[facility] LAB;  Service: Cardiovascular;  Laterality: N/A;  . Cardiac catheterization N/A 07/20/2015    Procedure: Coronary Stent Intervention;  Surgeon: Iran Ouch, MD;  Location: [facility] LAB;  Service: Cardiovascular;  Laterality: N/A;     Medications: Current Outpatient Prescriptions  Medication Sig Dispense Refill  . aspirin EC 81 MG tablet Take 1 tablet (81 mg total) by mouth daily. 30 tablet 11  . atorvastatin (LIPITOR) 80 MG tablet Take 80 mg by mouth daily.    . carvedilol (COREG) 12.5 MG tablet Take 12.5 mg by mouth 2 (two) times daily with a meal.    . losartan (COZAAR) 25 MG tablet Take 1 tablet (25 mg total) by mouth daily. 30 tablet 6  . nitroGLYCERIN (NITROSTAT) 0.4 MG SL tablet Place 1 tablet (0.4 mg total) under the tongue every 5 (five) minutes as  needed for chest pain (up to 3 doses). 25 tablet 3  . pantoprazole (PROTONIX) 40 MG tablet Take 40 mg by mouth daily as needed (acid reflux).     . prasugrel (EFFIENT) 10 MG TABS tablet Take 1 tablet (10 mg total) by mouth daily. 30 tablet 11   No current facility-administered medications for this visit.    Allergies: No Known Allergies  Social History: The patient  reports that he quit smoking about 8 years ago. His smoking use included Cigarettes. He has a 35 pack-year smoking history. He has never used smokeless tobacco. He reports that he drinks about 0.6 oz of alcohol per week. He reports that he uses illicit drugs (Marijuana).   Family History: The patient's family history includes Cancer - Prostate in his father.   Review of Systems: Please see the history of present illness.   Otherwise, the review of systems is positive for none.   All other systems are reviewed and negative.   Physical Exam: VS:  BP 100/80 mmHg  Pulse 71  Ht 6' (1.829 m)  Wt 239 lb 3.2 oz (108.5 kg)  BMI 32.43 kg/m2  SpO2 100% .  BMI Body mass index is 32.43 kg/(m^2).  Wt Readings from Last 3 Encounters:  07/29/15 239 lb 3.2 oz (108.5 kg)  07/21/15 237 lb 10.5 oz (107.8 kg)  08/14/13 259 lb (117.482 kg)    General: Pleasant. Well developed, well nourished and in no acute distress.  HEENT: Normal. Neck: Supple, no JVD, carotid bruits, or masses noted.  Cardiac: Regular rate and rhythm. No murmurs, rubs, or gallops. No edema.  Respiratory:  Lungs are clear to auscultation bilaterally with normal work of breathing.  GI: Soft and nontender.  MS: No deformity or atrophy. Gait and ROM intact. Skin: Warm and dry. Color is normal.  Neuro:  Strength and sensation are intact and no gross focal deficits noted.  Psych: Alert, appropriate and with normal affect. His cath site in the right radial looks fine.    LABORATORY DATA:  EKG:  EKG is not ordered today.  Lab Results  Component Value Date   WBC  6.7 07/21/2015   HGB 12.6* 07/21/2015   HCT 37.5* 07/21/2015   PLT 132* 07/21/2015   GLUCOSE 77 07/21/2015   CHOL 118 07/20/2015   TRIG 126 07/20/2015   HDL 24* 07/20/2015   LDLCALC 69 07/20/2015   ALT 29 07/20/2015   AST 25 07/20/2015   NA 140 07/21/2015   K 3.5 07/21/2015   CL 108 07/21/2015   CREATININE 1.23 07/21/2015   BUN 16 07/21/2015   CO2 25 07/21/2015   TSH 2.608 07/19/2015   INR 1.12 07/19/2015   HGBA1C 5.9* 07/19/2015    Lab Results  Component Value Date   TROPONINI <0.03 07/20/2015     BNP (last 3  results) No results for input(s): BNP in the last 8760 hours.  ProBNP (last 3 results) No results for input(s): PROBNP in the last 8760 hours.   Other Studies Reviewed Today: Cardiac Cath Conclusion from 06/2015    Ost RCA lesion, 50% stenosed.  LM lesion, 20% stenosed.  Mid Cx to Dist Cx lesion, 10% stenosed. The lesion was previously treated with a stent (unknown type) .  Dist Cx lesion, 50% stenosed.  Mid LAD lesion, 60% stenosed.  The left ventricular systolic function is normal.  Ost Cx to Prox Cx lesion, 95% stenosed. There is a 0% residual stenosis post intervention. The lesion was not previously treated.  A drug-eluting stent was placed.  1. Severe one-vessel coronary artery disease involving the ostial left circumflex with moderate proximal LAD stenosis. The ulcerated 60% stenosis in the LAD does not appear to be different from most recent cardiac catheterization in 2010. Patent mid left circumflex stent. 2. Normal LV systolic function and left ventricular end-diastolic pressure. 3. Successful angioplasty and Synergy stent placement to the ostial left circumflex.  Recommendations: Dual antiplatelets therapy for at least one year. Aggressive treatment of risk factors is recommended.      Assessment/Plan: 1. Post PCI - doing well clinically. Needs follow up lab today. Reminded about avoiding elective procedures over this next 12 months (he  thought he could go and get his knee scoped). Ok to return to work on August 10th.   2. CAD with residual nonobstructive CAD - needs CV risk factor modification. Walking every day and continue to lose weight. Continue to avoid tobacco.   3. HLD - on high dose statin  4. HTN - BP looks fine on his current regimen - even on the soft side. He is asymptomatic.   5. Borderline DM - to see PCP  Current medicines are reviewed with the patient today.  The patient does not have concerns regarding medicines other than what has been noted above.  The following changes have been made:  See above.  Labs/ tests ordered today include:    Orders Placed This Encounter  Procedures  . Basic metabolic panel  . CBC     Disposition:   FU with Dr. Rennis Golden in 6 to 8 weeks.  Patient is agreeable to this plan and will call if any problems develop in the interim.   Signed: Rosalio Macadamia, RN, ANP-C 07/29/2015 10:15 AM  [facility] [address] [place], [place]  [address] Phone: [phone] Fax: [phone]

## 2015-07-29 NOTE — Patient Instructions (Addendum)
We will be checking the following labs today - BMET and CBC   Medication Instructions:    Continue with your current medicines.   I have refilled your Protonix    Testing/Procedures To Be Arranged:  N/A  Follow-Up:   See Dr. Rennis Golden in 6 to 8 weeks.     Other Special Instructions:   Ok to return to work next Wednesday  Call the [facility] office at [phone] if you have any questions, problems or concerns.

## 2015-09-15 ENCOUNTER — Other Ambulatory Visit: Payer: Self-pay | Admitting: Physician Assistant

## 2015-09-20 ENCOUNTER — Ambulatory Visit (INDEPENDENT_AMBULATORY_CARE_PROVIDER_SITE_OTHER): Payer: Managed Care, Other (non HMO) | Admitting: Internal Medicine

## 2015-09-20 ENCOUNTER — Encounter: Payer: Self-pay | Admitting: Internal Medicine

## 2015-09-20 VITALS — BP 132/84 | HR 70 | Ht 72.0 in | Wt 242.9 lb

## 2015-09-20 DIAGNOSIS — Z0289 Encounter for other administrative examinations: Secondary | ICD-10-CM

## 2015-09-20 DIAGNOSIS — E785 Hyperlipidemia, unspecified: Secondary | ICD-10-CM

## 2015-09-20 DIAGNOSIS — I10 Essential (primary) hypertension: Secondary | ICD-10-CM

## 2015-09-20 DIAGNOSIS — I2510 Atherosclerotic heart disease of native coronary artery without angina pectoris: Secondary | ICD-10-CM

## 2015-09-20 DIAGNOSIS — I200 Unstable angina: Secondary | ICD-10-CM | POA: Diagnosis not present

## 2015-09-20 MED ORDER — LOSARTAN POTASSIUM 25 MG PO TABS: 25.0000 mg | ORAL_TABLET | Freq: Every day | ORAL | Status: DC

## 2015-09-20 NOTE — Progress Notes (Signed)
OFFICE NOTE  Chief Complaint:  Hospital follow-up, DOT  Primary Care Physician: MANNING, Adelene Amas., MD  HPI:  Luis Tucker is a pleasant 64 year old male with a history of coronary artery disease. He had a stent to the circumflex in 2008. 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 He also has hypertension dyslipidemia and metabolic syndrome. Is not very physically active as been unable to lose weight recently. 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 He denies any chest pain worsening shortness of breath over the past year and recently had laboratory work provided by Dr. Kathrynn Running at [facility] care which showed a well-controlled lipid profile.  He presented last week to [facility] with worsening chest pain concerning for angina.   His EKG showed SR, J point elevation and inferolateral T waves different from previous ECGs. He was admitted for further evaluation and placed on IV heparin. His home triamterene/HCTZ and losartan were held on admission due to Cr 1.29. Upon further review it appears prior Cr in 2011 was 1.46 so he likely has CKD stage III. Troponins remained negative. LDL 69. Had hypokalemia which required repletion (likely r/t prior HCTZ use). He underwent cardiac cath showing severe ostial LCx disease s/p angioplasty/Synergy stent placement with moderate prox LAD stenosis. LAD disease did not appear to be different than recent path. Patent mid LCx stent. Normal EF and LVEDP. DAPT for at least 1 year recommended - started on Effient.    Luis Tucker returns for follow-up. He was recently seen by Norma Fredrickson, NP, who felt that he was doing well. No medication changes were made. He subsequently has seen his primary care provider and was advised that he needed at DOT physical. He denies any further angina or shortness of breath.  PMHx:  Past Medical History  Diagnosis Date  . CAD (coronary artery  disease)     a. h/o stent to Cx 2008. b. cath 03/2009 demonstrated patent stent to circumflex, 60% LAD stenosis. c. [place] 06/2015: severe ostial LCx disease s/p angioplasty/Synergy stent placement. Moderate prox LAD disease, unchanged from prior, patent LCx stent, normal EF.  Marland Kitchen Hypertension   . Dyslipidemia   . Metabolic syndrome   . History of non-ST elevation myocardial infarction (NSTEMI) 03/2007    4.0 x 15 mm Vision BMS to mCFX  . History of nuclear stress test 2014    exercise myoview; perfusion defect consistent with infarct/scar (basal/mid/apical inferior regions); low risk scan   . GERD (gastroesophageal reflux disease)   . Hepatitis     "noncontagious type"  . CKD (chronic kidney disease), stage III   . Pre-diabetes     Past Surgical History  Procedure Laterality Date  . Transthoracic echocardiogram  04/2007    EF >55%; mild concentric LVH; borderline RV enlargement; RA & LA mildly dilated; mild MR, mild TR, mild PV regurg  . Knee arthroscopy Left 2004; 2011  . Coronary angioplasty with stent placement  03/2007    4.0 x 15-mm Vision BMS mCFX; 60% residual LAD  . Cardiac catheterization  2010    [place], LAD 60%, CFX 60-70%, stent OK, RCA OK, EF 60%  . Cardiac catheterization N/A 07/20/2015    Procedure: Left Heart Cath and Coronary Angiography;  Surgeon: Iran Ouch, MD;  Location: [facility];  Service: Cardiovascular;  Laterality: N/A;  . Cardiac catheterization N/A 07/20/2015    Procedure: Coronary Stent  Intervention;  Surgeon: Iran Ouch, MD;  Location: [facility];  Service: Cardiovascular;  Laterality: N/A;    FAMHx:  Family History  Problem Relation Age of Onset  . Cancer - Prostate Father     SOCHx:   reports that he quit smoking about 8 years ago. His smoking use included Cigarettes. He has a 35 pack-year smoking history. He has never used smokeless tobacco. He reports that he drinks about 0.6 oz of alcohol per week. He reports that he uses  illicit drugs (Marijuana).  ALLERGIES:  No Known Allergies  ROS: A comprehensive review of systems was negative.  HOME MEDS: Current Outpatient Prescriptions  Medication Sig Dispense Refill  . aspirin EC 81 MG tablet Take 1 tablet (81 mg total) by mouth daily. 30 tablet 11  . atorvastatin (LIPITOR) 80 MG tablet Take 80 mg by mouth daily.    . carvedilol (COREG) 12.5 MG tablet Take 12.5 mg by mouth 2 (two) times daily with a meal.    . EFFIENT 10 MG TABS tablet TAKE 1 TABLET BY MOUTH EVERY DAY 30 tablet 0  . nitroGLYCERIN (NITROSTAT) 0.4 MG SL tablet Place 1 tablet (0.4 mg total) under the tongue every 5 (five) minutes as needed for chest pain (up to 3 doses). 25 tablet 3  . pantoprazole (PROTONIX) 40 MG tablet Take 1 tablet (40 mg total) by mouth daily as needed (acid reflux). 30 tablet 11  . triamterene-hydrochlorothiazide (DYAZIDE) 37.5-25 MG per capsule Take 1 capsule by mouth every morning.  1  . losartan (COZAAR) 25 MG tablet Take 1 tablet (25 mg total) by mouth daily. 30 tablet 6   No current facility-administered medications for this visit.    LABS/IMAGING: No results found for this or any previous visit (from the past 48 hour(s)). No results found.  VITALS: BP 132/84 mmHg  Pulse 70  Ht 6' (1.829 m)  Wt 242 lb 14.4 oz (110.179 kg)  BMI 32.94 kg/m2  EXAM: General appearance: alert and no distress Neck: no adenopathy, no carotid bruit, no JVD, supple, symmetrical, trachea midline and thyroid not enlarged, symmetric, no tenderness/mass/nodules Lungs: clear to auscultation bilaterally Heart: regular rate and rhythm, S1, S2 normal, no murmur, click, rub or gallop Abdomen: soft, non-tender; bowel sounds normal; no masses,  no organomegaly Extremities: extremities normal, atraumatic, no cyanosis or edema Pulses: 2+ and symmetric Skin: Skin color, texture, turgor normal. No rashes or lesions Neurologic: Grossly normal  EKG: Deferred  ASSESSMENT: 1. Coronary artery  disease status post PCI to the circumflex in 2008 - recent PCI to the ostial left circumflex with a synergy DES 2. Metabolic syndrome 3. Hypertension 4. Hyperlipidemia 5. Encounter for DOT screening  PLAN: 1.   Luis Tucker recently presented with chest pain and underwent DES placement to the proximal left circumflex for 95% stenosis, upstream of the previously placed stent. He also has moderate LAD disease and some branch vessel disease. He's been asymptomatic since his stenting and is back to normal activities. He is currently on aspirin and Effient and high-dose statin. The DOT will require a stress test prior to allowing him to go back to work. I recommend an exercise Myoview which will schedule per his request in December. Plan to see him back in follow-up in 6 months and will recheck a lipid profile that time.  Chrystie Nose, MD, [facility] Attending Cardiologist [facility]  Lisette Abu Hilty 09/20/2015, 10:20 AM

## 2015-09-20 NOTE — Patient Instructions (Signed)
Your physician has requested that you have an exercise stress myoview in December. For further information please visit [email or web address] Please follow instruction sheet, as given.  Your physician wants you to follow-up in: 6 months with Dr. Rennis Golden. You will receive a reminder letter in the mail two months in advance. If you don't receive a letter, please call our office to schedule the follow-up appointment.

## 2015-10-16 ENCOUNTER — Other Ambulatory Visit: Payer: Self-pay | Admitting: Internal Medicine

## 2015-10-17 ENCOUNTER — Other Ambulatory Visit: Payer: Self-pay | Admitting: Internal Medicine

## 2015-11-29 ENCOUNTER — Telehealth (HOSPITAL_COMMUNITY): Payer: Self-pay

## 2015-11-29 NOTE — Telephone Encounter (Signed)
Encounter complete. 

## 2015-12-01 ENCOUNTER — Inpatient Hospital Stay (HOSPITAL_COMMUNITY): Admission: RE | Admit: 2015-12-01 | Payer: 59 | Source: Ambulatory Visit

## 2015-12-12 ENCOUNTER — Ambulatory Visit: Payer: 59 | Admitting: Internal Medicine

## 2015-12-14 ENCOUNTER — Telehealth (HOSPITAL_COMMUNITY): Payer: Self-pay

## 2015-12-14 NOTE — Telephone Encounter (Signed)
Encounter complete. 

## 2015-12-20 ENCOUNTER — Encounter: Payer: Self-pay | Admitting: Cardiovascular Disease

## 2015-12-20 ENCOUNTER — Ambulatory Visit (INDEPENDENT_AMBULATORY_CARE_PROVIDER_SITE_OTHER): Payer: Managed Care, Other (non HMO) | Admitting: Cardiology

## 2015-12-20 ENCOUNTER — Ambulatory Visit (HOSPITAL_COMMUNITY)
Admission: RE | Admit: 2015-12-20 | Discharge: 2015-12-20 | Disposition: A | Discharge status: Home / Self Care | Payer: Managed Care, Other (non HMO) | Source: Ambulatory Visit | Attending: Internal Medicine | Admitting: Internal Medicine

## 2015-12-20 ENCOUNTER — Encounter (HOSPITAL_COMMUNITY): Payer: Self-pay | Admitting: *Deleted

## 2015-12-20 ENCOUNTER — Encounter: Payer: Self-pay | Admitting: Cardiology

## 2015-12-20 VITALS — BP 124/82 | HR 79 | Ht 72.0 in | Wt 238.0 lb

## 2015-12-20 DIAGNOSIS — R931 Abnormal findings on diagnostic imaging of heart and coronary circulation: Secondary | ICD-10-CM | POA: Diagnosis not present

## 2015-12-20 DIAGNOSIS — I10 Essential (primary) hypertension: Secondary | ICD-10-CM | POA: Insufficient documentation

## 2015-12-20 DIAGNOSIS — E669 Obesity, unspecified: Secondary | ICD-10-CM | POA: Insufficient documentation

## 2015-12-20 DIAGNOSIS — R0609 Other forms of dyspnea: Secondary | ICD-10-CM

## 2015-12-20 DIAGNOSIS — Z0289 Encounter for other administrative examinations: Secondary | ICD-10-CM

## 2015-12-20 DIAGNOSIS — Z9861 Coronary angioplasty status: Secondary | ICD-10-CM

## 2015-12-20 DIAGNOSIS — I2510 Atherosclerotic heart disease of native coronary artery without angina pectoris: Secondary | ICD-10-CM | POA: Diagnosis not present

## 2015-12-20 DIAGNOSIS — Z6832 Body mass index (BMI) 32.0-32.9, adult: Secondary | ICD-10-CM | POA: Diagnosis not present

## 2015-12-20 DIAGNOSIS — Z01812 Encounter for preprocedural laboratory examination: Secondary | ICD-10-CM

## 2015-12-20 DIAGNOSIS — R9439 Abnormal result of other cardiovascular function study: Secondary | ICD-10-CM | POA: Insufficient documentation

## 2015-12-20 DIAGNOSIS — Z87891 Personal history of nicotine dependence: Secondary | ICD-10-CM | POA: Diagnosis not present

## 2015-12-20 DIAGNOSIS — R079 Chest pain, unspecified: Secondary | ICD-10-CM | POA: Diagnosis not present

## 2015-12-20 DIAGNOSIS — I517 Cardiomegaly: Secondary | ICD-10-CM | POA: Diagnosis not present

## 2015-12-20 DIAGNOSIS — E785 Hyperlipidemia, unspecified: Secondary | ICD-10-CM | POA: Diagnosis not present

## 2015-12-20 DIAGNOSIS — R0600 Dyspnea, unspecified: Secondary | ICD-10-CM

## 2015-12-20 LAB — MYOCARDIAL PERFUSION IMAGING
CSEPED: 4 min
CSEPPHR: 142 {beats}/min
Estimated workload: 5.8 METS
LV dias vol: 148 mL
LVSYSVOL: 87 mL
MPHR: 158 {beats}/min
Percent HR: 89 %
RPE: 16
Rest HR: 63 {beats}/min
SDS: 5
SRS: 9
SSS: 14
TID: 1.37

## 2015-12-20 MED ORDER — TECHNETIUM TC 99M SESTAMIBI GENERIC - CARDIOLITE
10.7000 | Freq: Once | INTRAVENOUS | Status: AC | PRN
  Administered 2015-12-20: 11 via INTRAVENOUS

## 2015-12-20 MED ORDER — ISOSORBIDE MONONITRATE ER 30 MG PO TB24: 15.0000 mg | ORAL_TABLET | Freq: Every day | ORAL | Status: DC

## 2015-12-20 MED ORDER — NITROGLYCERIN 0.4 MG SL SUBL: 0.4000 mg | SUBLINGUAL_TABLET | SUBLINGUAL | Status: DC | PRN

## 2015-12-20 MED ORDER — TECHNETIUM TC 99M SESTAMIBI GENERIC - CARDIOLITE
28.1000 | Freq: Once | INTRAVENOUS | Status: AC | PRN
  Administered 2015-12-20: 28.1 via INTRAVENOUS

## 2015-12-20 NOTE — Assessment & Plan Note (Signed)
Controlled.  

## 2015-12-20 NOTE — Progress Notes (Signed)
Dr Crenshaw reviewed Cardiolite. Ok d/c pt home. Luis Tucker A  

## 2015-12-20 NOTE — Assessment & Plan Note (Signed)
PCI to LCx in 2008, CFX DES July 2016 (new site), residulal 60% LAD unchanged

## 2015-12-20 NOTE — Assessment & Plan Note (Signed)
?   Anginal equivalent 

## 2015-12-20 NOTE — Progress Notes (Signed)
12/20/2015 Luis Tucker   01/22/1951  [id number]  Primary Physician MANNING, Luis AmasJAMES S., MD Primary Cardiologist: Dr Luis Tucker  HPI:  64 year-old AA male with a history of CAD. He had a stent to the circumflex in 2008. He presented with [place] in July 2016 and was re studied. This reveled a patent mid CFX stent but a new proximal CFX stent. This was treated with a DES and he was discharged on Effient. He had residual 60% LAD that appeared unchanged. His EF was normal.          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 Other medical problems include hypertension, dyslipidemia, and metabolic syndrome. He recently saw Dr Luis Tucker and was set up for a treadmill Myoview today so he could return to work driving. The scan was abnormal- read as high risk. 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 On his treadmill he also had ST depression with peak exercise.    Current Outpatient Prescriptions  Medication Sig Dispense Refill  . aspirin EC 81 MG tablet Take 1 tablet (81 mg total) by mouth daily. 30 tablet 11  . atorvastatin (LIPITOR) 80 MG tablet Take 80 mg by mouth daily.    . carvedilol (COREG) 12.5 MG tablet Take 12.5 mg by mouth 2 (two) times daily with a meal.    . EFFIENT 10 MG TABS tablet TAKE 1 TABLET BY MOUTH EVERY DAY 30 tablet 5  . isosorbide mononitrate (IMDUR) 30 MG 24 hr tablet Take 0.5 tablets (15 mg total) by mouth daily. 15 tablet 11  . losartan (COZAAR) 25 MG tablet Take 1 tablet (25 mg total) by mouth daily. 30 tablet 6  . nitroGLYCERIN (NITROSTAT) 0.4 MG SL tablet Place 1 tablet (0.4 mg total) under the tongue every 5 (five) minutes as needed for chest pain (up to 3 doses). 25 tablet 3  . pantoprazole (PROTONIX) 40 MG tablet Take 1 tablet (40 mg total) by mouth daily as needed (acid reflux). 30 tablet 11  . triamterene-hydrochlorothiazide (DYAZIDE) 37.5-25 MG per capsule Take 1 capsule by mouth every morning.  1   No current  facility-administered medications for this visit.    No Known Allergies  Social History   Social History  . Marital Status: Married    Spouse Name: N/A  . Number of Children: 3  . Years of Education: N/A   Occupational History  . 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 Smoking status: Former Smoker -- 1.00 packs/day for 35 years    Types: Cigarettes    Quit date: 03/25/2007  . Smokeless tobacco: Never Used  . Alcohol Use: 0.6 oz/week    0 Standard drinks or equivalent, 1 Cans of beer per week  . Drug Use: Yes    Special: Marijuana     Comment: 07/19/2015 "marijuana maybe 1-2 times/month"  . Sexual Activity: Yes   Other Topics Concern  . Not on file   Social History Narrative   Pt lives with girlfriend.     Review of Systems: General: negative for chills, fever, night sweats or weight changes.  Cardiovascular: negative for edema, orthopnea, palpitations, paroxysmal nocturnal dyspnea or shortness of breath Dermatological: negative for rash Respiratory: negative for cough or wheezing Urologic: negative for hematuria Abdominal: negative for nausea, vomiting, diarrhea, bright red blood per rectum, melena, or hematemesis Neurologic: negative  for visual changes, syncope, or dizziness All other systems reviewed and are otherwise negative except as noted above.    Blood pressure 124/82, pulse 79, height 6' (1.829 m), weight 238 lb (107.956 kg).  General appearance: alert, cooperative, no distress and mildly obese Neck: no carotid bruit and no JVD Lungs: clear to auscultation bilaterally Heart: regular rate and rhythm Abdomen: soft, non-tender; bowel sounds normal; no masses,  no organomegaly Extremities: no edema Pulses: 2+ and symmetric Skin: Skin color, texture, turgor normal. No rashes or lesions Neurologic: Grossly normal   ASSESSMENT AND PLAN:     Abnormal nuclear cardiac imaging test Abnormal Myoview today, felt to be high  risk  Exertional dyspnea Anginal equivalent  CAD S/P CFX PCI x 2 PCI to LCx in 2008, CFX DES July 2016 (new site), residulal 60% LAD unchanged  Hyperlipidemia LDL 69 July 2016  HTN (hypertension) Controlled   PLAN  Myoview images reviewed with Dr Luis Tucker and Dr Luis Tucker in the office today. Pt will set up for OP cath. We added Imdur 15 mg to his medications. He asked to return to work but no strenuous activity and I gave him a letter for this. He's had some mild renal insufficiency previously and I will hold his Dyazide day of cath.    Abelino Derrick PA-C 12/20/2015 4:49 PM

## 2015-12-20 NOTE — Assessment & Plan Note (Signed)
LDL 69 July 2016

## 2015-12-20 NOTE — Assessment & Plan Note (Signed)
Abnormal Myoview today, felt to be high risk

## 2015-12-20 NOTE — Patient Instructions (Signed)
Luis ShelterLuke Kilroy, PA-C, has requested that you have a cardiac catheterization with Dr Allyson SabalBerry. Cardiac catheterization is used to diagnose and/or treat various heart conditions. Doctors may recommend this procedure for a number of different reasons. The most common reason is to evaluate chest pain. Chest pain can be a symptom of coronary artery disease (CAD), and cardiac catheterization can show whether plaque is narrowing or blocking your heart's arteries. This procedure is also used to evaluate the valves, as well as measure the blood flow and oxygen levels in different parts of your heart. For further information please visit [email or web address].   Following your catheterization, you will not be allowed to drive for 3 days.  No lifting, pushing, or pulling greater that 10 pounds is allowed for 1 week.  You will be required to have the following tests prior to the procedure:  1. Blood work-the blood work can be done no more than 7 days prior to the procedure.  It can be done at any [facility].  There is one downstairs on the first floor of this building and one in the [facility] building [phone]([address], [address]).  2. Chest Xray-[facility].  Luis ShelterLuke Kilroy, PA-C, has recommended making the following medication changes: START Isosorbide Mononitrate (Imdur) 30 mg tablets - take 0.5 tablet (15 mg total) by mouth daily.  >>A new prescription has been sent to your pharmacy electronically.

## 2015-12-21 ENCOUNTER — Ambulatory Visit
Admission: RE | Admit: 2015-12-21 | Discharge: 2015-12-21 | Disposition: A | Discharge status: Home / Self Care | Payer: Managed Care, Other (non HMO) | Source: Ambulatory Visit | Attending: Cardiology | Admitting: Cardiology

## 2015-12-21 DIAGNOSIS — I2510 Atherosclerotic heart disease of native coronary artery without angina pectoris: Secondary | ICD-10-CM

## 2015-12-21 DIAGNOSIS — R931 Abnormal findings on diagnostic imaging of heart and coronary circulation: Secondary | ICD-10-CM

## 2015-12-21 DIAGNOSIS — Z01812 Encounter for preprocedural laboratory examination: Secondary | ICD-10-CM

## 2015-12-21 DIAGNOSIS — Z9861 Coronary angioplasty status: Secondary | ICD-10-CM

## 2015-12-21 LAB — CBC
HCT: 41.7 % (ref 39.0–52.0)
Hemoglobin: 13.7 g/dL (ref 13.0–17.0)
MCH: 27.1 pg (ref 26.0–34.0)
MCHC: 32.9 g/dL (ref 30.0–36.0)
MCV: 82.6 fL (ref 78.0–100.0)
MPV: 11.5 fL (ref 8.6–12.4)
Platelets: 172 10*3/uL (ref 150–400)
RBC: 5.05 MIL/uL (ref 4.22–5.81)
RDW: 13.6 % (ref 11.5–15.5)
WBC: 6.5 10*3/uL (ref 4.0–10.5)

## 2015-12-21 LAB — PROTIME-INR
INR: 1.06 (ref <1.50)
Prothrombin Time: 13.9 seconds (ref 11.6–15.2)

## 2015-12-21 LAB — APTT: aPTT: 33 seconds (ref 24–37)

## 2015-12-22 ENCOUNTER — Ambulatory Visit: Payer: Managed Care, Other (non HMO) | Admitting: Cardiology

## 2015-12-22 LAB — BASIC METABOLIC PANEL
BUN: 23 mg/dL (ref 7–25)
CO2: 27 mmol/L (ref 20–31)
Calcium: 8.8 mg/dL (ref 8.6–10.3)
Chloride: 101 mmol/L (ref 98–110)
Creat: 1.19 mg/dL (ref 0.70–1.25)
Glucose, Bld: 93 mg/dL (ref 65–99)
Potassium: 4 mmol/L (ref 3.5–5.3)
Sodium: 140 mmol/L (ref 135–146)

## 2015-12-22 LAB — TSH: TSH: 1.072 u[IU]/mL (ref 0.350–4.500)

## 2015-12-29 ENCOUNTER — Inpatient Hospital Stay (HOSPITAL_COMMUNITY)
Admission: AD | Admit: 2015-12-29 | Discharge: 2016-01-09 | DRG: 234 | Disposition: A | Discharge status: Home / Self Care | Payer: 59 | Source: Ambulatory Visit | Attending: Thoracic Surgery (Cardiothoracic Vascular Surgery) | Admitting: Thoracic Surgery (Cardiothoracic Vascular Surgery)

## 2015-12-29 ENCOUNTER — Encounter (HOSPITAL_COMMUNITY)
Admission: AD | Disposition: A | Discharge status: Home / Self Care | Payer: Self-pay | Source: Ambulatory Visit | Attending: Cardiovascular Disease

## 2015-12-29 ENCOUNTER — Other Ambulatory Visit: Payer: Self-pay | Admitting: *Deleted

## 2015-12-29 ENCOUNTER — Encounter (HOSPITAL_COMMUNITY): Payer: Self-pay | Admitting: General Practice

## 2015-12-29 DIAGNOSIS — Z951 Presence of aortocoronary bypass graft: Secondary | ICD-10-CM

## 2015-12-29 DIAGNOSIS — R931 Abnormal findings on diagnostic imaging of heart and coronary circulation: Secondary | ICD-10-CM | POA: Diagnosis not present

## 2015-12-29 DIAGNOSIS — I252 Old myocardial infarction: Secondary | ICD-10-CM | POA: Diagnosis not present

## 2015-12-29 DIAGNOSIS — E785 Hyperlipidemia, unspecified: Secondary | ICD-10-CM | POA: Diagnosis present

## 2015-12-29 DIAGNOSIS — Y831 Surgical operation with implant of artificial internal device as the cause of abnormal reaction of the patient, or of later complication, without mention of misadventure at the time of the procedure: Secondary | ICD-10-CM | POA: Diagnosis present

## 2015-12-29 DIAGNOSIS — Z7982 Long term (current) use of aspirin: Secondary | ICD-10-CM | POA: Diagnosis not present

## 2015-12-29 DIAGNOSIS — K5900 Constipation, unspecified: Secondary | ICD-10-CM | POA: Diagnosis not present

## 2015-12-29 DIAGNOSIS — I25110 Atherosclerotic heart disease of native coronary artery with unstable angina pectoris: Secondary | ICD-10-CM | POA: Diagnosis present

## 2015-12-29 DIAGNOSIS — E8881 Metabolic syndrome: Secondary | ICD-10-CM | POA: Diagnosis present

## 2015-12-29 DIAGNOSIS — D62 Acute posthemorrhagic anemia: Secondary | ICD-10-CM | POA: Diagnosis not present

## 2015-12-29 DIAGNOSIS — R0602 Shortness of breath: Secondary | ICD-10-CM

## 2015-12-29 DIAGNOSIS — I2583 Coronary atherosclerosis due to lipid rich plaque: Secondary | ICD-10-CM | POA: Diagnosis present

## 2015-12-29 DIAGNOSIS — Z87891 Personal history of nicotine dependence: Secondary | ICD-10-CM | POA: Diagnosis not present

## 2015-12-29 DIAGNOSIS — E1122 Type 2 diabetes mellitus with diabetic chronic kidney disease: Secondary | ICD-10-CM | POA: Diagnosis present

## 2015-12-29 DIAGNOSIS — T82855A Stenosis of coronary artery stent, initial encounter: Principal | ICD-10-CM | POA: Diagnosis present

## 2015-12-29 DIAGNOSIS — M170 Bilateral primary osteoarthritis of knee: Secondary | ICD-10-CM | POA: Diagnosis present

## 2015-12-29 DIAGNOSIS — I10 Essential (primary) hypertension: Secondary | ICD-10-CM | POA: Diagnosis not present

## 2015-12-29 DIAGNOSIS — M7552 Bursitis of left shoulder: Secondary | ICD-10-CM | POA: Diagnosis present

## 2015-12-29 DIAGNOSIS — N183 Chronic kidney disease, stage 3 (moderate): Secondary | ICD-10-CM | POA: Diagnosis present

## 2015-12-29 DIAGNOSIS — I2510 Atherosclerotic heart disease of native coronary artery without angina pectoris: Secondary | ICD-10-CM

## 2015-12-29 DIAGNOSIS — I200 Unstable angina: Secondary | ICD-10-CM | POA: Diagnosis not present

## 2015-12-29 DIAGNOSIS — I129 Hypertensive chronic kidney disease with stage 1 through stage 4 chronic kidney disease, or unspecified chronic kidney disease: Secondary | ICD-10-CM | POA: Diagnosis present

## 2015-12-29 DIAGNOSIS — K219 Gastro-esophageal reflux disease without esophagitis: Secondary | ICD-10-CM | POA: Diagnosis present

## 2015-12-29 DIAGNOSIS — J9811 Atelectasis: Secondary | ICD-10-CM

## 2015-12-29 DIAGNOSIS — Z79899 Other long term (current) drug therapy: Secondary | ICD-10-CM

## 2015-12-29 HISTORY — DX: Calculus of kidney: N20.0

## 2015-12-29 HISTORY — DX: Unspecified osteoarthritis, unspecified site: M19.90

## 2015-12-29 HISTORY — PX: CARDIAC CATHETERIZATION: SHX172

## 2015-12-29 HISTORY — DX: Presence of aortocoronary bypass graft: Z95.1

## 2015-12-29 SURGERY — LEFT HEART CATH AND CORONARY ANGIOGRAPHY
Anesthesia: LOCAL

## 2015-12-29 MED ORDER — NITROGLYCERIN IN D5W 200-5 MCG/ML-% IV SOLN
INTRAVENOUS | Status: DC | PRN
  Administered 2015-12-29: 10 ug/min via INTRAVENOUS

## 2015-12-29 MED ORDER — NITROGLYCERIN 0.4 MG SL SUBL
0.4000 mg | SUBLINGUAL_TABLET | SUBLINGUAL | Status: DC | PRN
Start: 2015-12-29 — End: 2016-01-04
  Administered 2016-01-02: 0.4 mg via SUBLINGUAL
  Filled 2015-12-29: qty 1

## 2015-12-29 MED ORDER — PANTOPRAZOLE SODIUM 40 MG PO TBEC
40.0000 mg | DELAYED_RELEASE_TABLET | Freq: Every day | ORAL | Status: DC | PRN
  Administered 2016-01-01 – 2016-01-03 (×3): 40 mg via ORAL
  Filled 2015-12-29 (×3): qty 1

## 2015-12-29 MED ORDER — ACETAMINOPHEN 325 MG PO TABS: 650.0000 mg | ORAL_TABLET | ORAL | Status: DC | PRN

## 2015-12-29 MED ORDER — ASPIRIN 81 MG PO CHEW: 81.0000 mg | CHEWABLE_TABLET | ORAL | Status: DC

## 2015-12-29 MED ORDER — SODIUM CHLORIDE 0.9 % IV SOLN: 250.0000 mL | INTRAVENOUS | Status: DC | PRN

## 2015-12-29 MED ORDER — ASPIRIN 81 MG PO CHEW
81.0000 mg | CHEWABLE_TABLET | Freq: Every day | ORAL | Status: DC
  Administered 2015-12-30 – 2016-01-01 (×3): 81 mg via ORAL
  Filled 2015-12-29 (×4): qty 1

## 2015-12-29 MED ORDER — IOHEXOL 350 MG/ML SOLN
INTRAVENOUS | Status: DC | PRN
  Administered 2015-12-29: 80 mL via INTRA_ARTERIAL

## 2015-12-29 MED ORDER — SODIUM CHLORIDE 0.9 % WEIGHT BASED INFUSION
3.0000 mL/kg/h | INTRAVENOUS | Status: AC
Start: 2015-12-29 — End: 2015-12-29
  Administered 2015-12-29: 3 mL/kg/h via INTRAVENOUS

## 2015-12-29 MED ORDER — ATORVASTATIN CALCIUM 80 MG PO TABS
80.0000 mg | ORAL_TABLET | Freq: Every day | ORAL | Status: DC
  Administered 2015-12-29 – 2016-01-03 (×6): 80 mg via ORAL
  Filled 2015-12-29 (×4): qty 1
  Filled 2015-12-29: qty 2
  Filled 2015-12-29: qty 1

## 2015-12-29 MED ORDER — LIDOCAINE HCL (PF) 1 % IJ SOLN
INTRAMUSCULAR | Status: AC
  Filled 2015-12-29: qty 30

## 2015-12-29 MED ORDER — MORPHINE SULFATE (PF) 2 MG/ML IV SOLN: 2.0000 mg | INTRAVENOUS | Status: DC | PRN

## 2015-12-29 MED ORDER — LIDOCAINE HCL (PF) 1 % IJ SOLN
INTRAMUSCULAR | Status: DC | PRN
  Administered 2015-12-29: 16:00:00

## 2015-12-29 MED ORDER — TRIAMTERENE-HCTZ 37.5-25 MG PO CAPS
1.0000 | ORAL_CAPSULE | Freq: Every morning | ORAL | Status: DC
  Administered 2015-12-29 – 2016-01-02 (×5): 1 via ORAL
  Filled 2015-12-29 (×5): qty 1

## 2015-12-29 MED ORDER — SODIUM CHLORIDE 0.9 % IJ SOLN: 3.0000 mL | INTRAMUSCULAR | Status: DC | PRN

## 2015-12-29 MED ORDER — CARVEDILOL 12.5 MG PO TABS
12.5000 mg | ORAL_TABLET | Freq: Two times a day (BID) | ORAL | Status: DC
Start: 2015-12-29 — End: 2016-01-02
  Administered 2015-12-29 – 2016-01-02 (×8): 12.5 mg via ORAL
  Filled 2015-12-29 (×8): qty 1

## 2015-12-29 MED ORDER — NITROGLYCERIN IN D5W 200-5 MCG/ML-% IV SOLN
INTRAVENOUS | Status: AC
  Filled 2015-12-29: qty 250

## 2015-12-29 MED ORDER — NITROGLYCERIN 1 MG/10 ML FOR IR/CATH LAB
INTRA_ARTERIAL | Status: AC
  Filled 2015-12-29: qty 10

## 2015-12-29 MED ORDER — SODIUM CHLORIDE 0.9 % IJ SOLN: 3.0000 mL | Freq: Two times a day (BID) | INTRAMUSCULAR | Status: DC

## 2015-12-29 MED ORDER — VERAPAMIL HCL 2.5 MG/ML IV SOLN
INTRAVENOUS | Status: AC
  Filled 2015-12-29: qty 2

## 2015-12-29 MED ORDER — LOSARTAN POTASSIUM 25 MG PO TABS
25.0000 mg | ORAL_TABLET | Freq: Every day | ORAL | Status: DC
  Administered 2015-12-29 – 2016-01-03 (×6): 25 mg via ORAL
  Filled 2015-12-29 (×6): qty 1

## 2015-12-29 MED ORDER — ASPIRIN EC 81 MG PO TBEC
81.0000 mg | DELAYED_RELEASE_TABLET | Freq: Every day | ORAL | Status: DC
  Administered 2015-12-29 – 2016-01-03 (×6): 81 mg via ORAL
  Filled 2015-12-29 (×6): qty 1

## 2015-12-29 MED ORDER — SODIUM CHLORIDE 0.9 % WEIGHT BASED INFUSION: 1.0000 mL/kg/h | INTRAVENOUS | Status: DC

## 2015-12-29 MED ORDER — HEPARIN (PORCINE) IN NACL 100-0.45 UNIT/ML-% IJ SOLN
1400.0000 [IU]/h | INTRAMUSCULAR | Status: DC
  Administered 2015-12-30 (×2): 1400 [IU]/h via INTRAVENOUS
  Filled 2015-12-29 (×2): qty 250

## 2015-12-29 MED ORDER — HEPARIN (PORCINE) IN NACL 2-0.9 UNIT/ML-% IJ SOLN
INTRAMUSCULAR | Status: AC
  Filled 2015-12-29: qty 1000

## 2015-12-29 MED ORDER — SODIUM CHLORIDE 0.9 % WEIGHT BASED INFUSION: 3.0000 mL/kg/h | INTRAVENOUS | Status: DC

## 2015-12-29 MED ORDER — ATORVASTATIN CALCIUM 80 MG PO TABS: 80.0000 mg | ORAL_TABLET | Freq: Every day | ORAL | Status: DC

## 2015-12-29 MED ORDER — ONDANSETRON HCL 4 MG/2ML IJ SOLN: 4.0000 mg | Freq: Four times a day (QID) | INTRAMUSCULAR | Status: DC | PRN

## 2015-12-29 MED ORDER — SODIUM CHLORIDE 0.9 % IJ SOLN
3.0000 mL | Freq: Two times a day (BID) | INTRAMUSCULAR | Status: DC
  Administered 2015-12-29 – 2015-12-31 (×4): 3 mL via INTRAVENOUS

## 2015-12-29 MED ORDER — VERAPAMIL HCL 2.5 MG/ML IV SOLN
INTRA_ARTERIAL | Status: DC | PRN
  Administered 2015-12-29 (×2): 7.5 mL via INTRA_ARTERIAL

## 2015-12-29 MED ORDER — HEPARIN SODIUM (PORCINE) 1000 UNIT/ML IJ SOLN
INTRAMUSCULAR | Status: DC | PRN
  Administered 2015-12-29: 5000 [IU] via INTRAVENOUS

## 2015-12-29 MED ORDER — HEPARIN SODIUM (PORCINE) 1000 UNIT/ML IJ SOLN
INTRAMUSCULAR | Status: AC
  Filled 2015-12-29: qty 1

## 2015-12-29 SURGICAL SUPPLY — 11 items

## 2015-12-29 NOTE — Progress Notes (Signed)
ANTICOAGULATION CONSULT NOTE - Initial Consult  Pharmacy Consult for  Indication: chest pain/ACS  No Known Allergies  Patient Measurements: Weight: 238 lb (107.956 kg) Heparin Dosing Weight: 100kg  Vital Signs: Temp: 98.5 F (36.9 C) (01/05 1426) Temp Source: Oral (01/05 1426) BP: 142/97 mmHg (01/05 1610) Pulse Rate: 81 (01/05 1610)  Labs: No results for input(s): HGB, HCT, PLT, APTT, LABPROT, INR, HEPARINUNFRC, CREATININE, CKTOTAL, CKMB, TROPONINI in the last 72 hours.  Estimated Creatinine Clearance: 81.8 mL/min (by C-G formula based on Cr of 1.19).   Medical History: Past Medical History  Diagnosis Date  . CAD (coronary artery disease)     a. h/o stent to Cx 2008. b. cath 03/2009 demonstrated patent stent to circumflex, 60% LAD stenosis. c. [place] 06/2015: severe ostial LCx disease s/p angioplasty/Synergy stent placement. Moderate prox LAD disease, unchanged from prior, patent LCx stent, normal EF.  Marland Kitchen. Hypertension   . Dyslipidemia   . Metabolic syndrome   . History of non-ST elevation myocardial infarction (NSTEMI) 03/2007    4.0 x 15 mm Vision BMS to mCFX  . History of nuclear stress test 2014    exercise myoview; perfusion defect consistent with infarct/scar (basal/mid/apical inferior regions); low risk scan   . GERD (gastroesophageal reflux disease)   . Hepatitis     "noncontagious type"  . CKD (chronic kidney disease), stage III   . Pre-diabetes    Assessment: 65 year old male with multivessel CAD according to cath this afternoon. Patient was taking effient which has now been stopped, will start IV heparin once TR band is off. CVTS has been consulted.  Patient previously at goal on 1400 units/hr. No bleeding/hematoma complications noted during cath.  Goal of Therapy:  Heparin level 0.3-0.7 units/ml Monitor platelets by anticoagulation protocol: Yes   Plan:  Start IV heparin 4 hours after TR band removed 6 hour HL then daily with CBC  Sheppard CoilFrank Wilson PharmD.,  BCPS Clinical Pharmacist Pager [phone] 12/29/2015 4:42 PM

## 2015-12-29 NOTE — H&P (View-Only) (Signed)
12/20/2015 Luis Tucker   01/22/1951  [id number]  Primary Physician MANNING, Adelene AmasJAMES S., MD Primary Cardiologist: Dr Rennis GoldenHilty  HPI:  65 year-old AA male with a history of CAD. He had a stent to the circumflex in 2008. He presented with [place] in July 2016 and was re studied. This reveled a patent mid CFX stent but a new proximal CFX stent. This was treated with a DES and he was discharged on Effient. He had residual 60% LAD that appeared unchanged. His EF was normal.          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 Other medical problems include hypertension, dyslipidemia, and metabolic syndrome. He recently saw Dr Rennis GoldenHilty and was set up for a treadmill Myoview today so he could return to work driving. The scan was abnormal- read as high risk. 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 On his treadmill he also had ST depression with peak exercise.    Current Outpatient Prescriptions  Medication Sig Dispense Refill  . aspirin EC 81 MG tablet Take 1 tablet (81 mg total) by mouth daily. 30 tablet 11  . atorvastatin (LIPITOR) 80 MG tablet Take 80 mg by mouth daily.    . carvedilol (COREG) 12.5 MG tablet Take 12.5 mg by mouth 2 (two) times daily with a meal.    . EFFIENT 10 MG TABS tablet TAKE 1 TABLET BY MOUTH EVERY DAY 30 tablet 5  . isosorbide mononitrate (IMDUR) 30 MG 24 hr tablet Take 0.5 tablets (15 mg total) by mouth daily. 15 tablet 11  . losartan (COZAAR) 25 MG tablet Take 1 tablet (25 mg total) by mouth daily. 30 tablet 6  . nitroGLYCERIN (NITROSTAT) 0.4 MG SL tablet Place 1 tablet (0.4 mg total) under the tongue every 5 (five) minutes as needed for chest pain (up to 3 doses). 25 tablet 3  . pantoprazole (PROTONIX) 40 MG tablet Take 1 tablet (40 mg total) by mouth daily as needed (acid reflux). 30 tablet 11  . triamterene-hydrochlorothiazide (DYAZIDE) 37.5-25 MG per capsule Take 1 capsule by mouth every morning.  1   No current  facility-administered medications for this visit.    No Known Allergies  Social History   Social History  . Marital Status: Married    Spouse Name: N/A  . Number of Children: 3  . Years of Education: N/A   Occupational History  . 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 Smoking status: Former Smoker -- 1.00 packs/day for 35 years    Types: Cigarettes    Quit date: 03/25/2007  . Smokeless tobacco: Never Used  . Alcohol Use: 0.6 oz/week    0 Standard drinks or equivalent, 1 Cans of beer per week  . Drug Use: Yes    Special: Marijuana     Comment: 07/19/2015 "marijuana maybe 1-2 times/month"  . Sexual Activity: Yes   Other Topics Concern  . Not on file   Social History Narrative   Pt lives with girlfriend.     Review of Systems: General: negative for chills, fever, night sweats or weight changes.  Cardiovascular: negative for edema, orthopnea, palpitations, paroxysmal nocturnal dyspnea or shortness of breath Dermatological: negative for rash Respiratory: negative for cough or wheezing Urologic: negative for hematuria Abdominal: negative for nausea, vomiting, diarrhea, bright red blood per rectum, melena, or hematemesis Neurologic: negative  for visual changes, syncope, or dizziness All other systems reviewed and are otherwise negative except as noted above.    Blood pressure 124/82, pulse 79, height 6' (1.829 m), weight 238 lb (107.956 kg).  General appearance: alert, cooperative, no distress and mildly obese Neck: no carotid bruit and no JVD Lungs: clear to auscultation bilaterally Heart: regular rate and rhythm Abdomen: soft, non-tender; bowel sounds normal; no masses,  no organomegaly Extremities: no edema Pulses: 2+ and symmetric Skin: Skin color, texture, turgor normal. No rashes or lesions Neurologic: Grossly normal   ASSESSMENT AND PLAN:     Abnormal nuclear cardiac imaging test Abnormal Myoview today, felt to be high  risk  Exertional dyspnea Anginal equivalent  CAD S/P CFX PCI x 2 PCI to LCx in 2008, CFX DES July 2016 (new site), residulal 60% LAD unchanged  Hyperlipidemia LDL 69 July 2016  HTN (hypertension) Controlled   PLAN  Myoview images reviewed with Dr Allyson SabalBerry and Dr Jens Somrenshaw in the office today. Pt will set up for OP cath. We added Imdur 15 mg to his medications. He asked to return to work but no strenuous activity and I gave him a letter for this. He's had some mild renal insufficiency previously and I will hold his Dyazide day of cath.    Abelino DerrickKILROY,Samul Mcinroy K PA-C 12/20/2015 4:49 PM

## 2015-12-29 NOTE — Interval H&P Note (Signed)
Cath Lab Visit (complete for each Cath Lab visit)  Clinical Evaluation Leading to the Procedure:   ACS: No.  Non-ACS:    Anginal Classification: CCS II  Anti-ischemic medical therapy: Maximal Therapy (2 or more classes of medications)  Non-Invasive Test Results: High-risk stress test findings: cardiac mortality >3%/year  Prior CABG: No previous CABG      History and Physical Interval Note:  12/29/2015 3:39 PM  Luis Tucker  has presented today for surgery, with the diagnosis of abnormal nuclear stress test  The various methods of treatment have been discussed with the patient and family. After consideration of risks, benefits and other options for treatment, the patient has consented to  Procedure(s): Left Heart Cath and Coronary Angiography (N/A) as a surgical intervention .  The patient's history has been reviewed, patient examined, no change in status, stable for surgery.  I have reviewed the patient's chart and labs.  Questions were answered to the patient's satisfaction.     Nanetta BattyBerry, Luis Tucker

## 2015-12-30 ENCOUNTER — Encounter (HOSPITAL_COMMUNITY): Payer: Self-pay | Admitting: Cardiovascular Disease

## 2015-12-30 DIAGNOSIS — R931 Abnormal findings on diagnostic imaging of heart and coronary circulation: Secondary | ICD-10-CM

## 2015-12-30 DIAGNOSIS — I2510 Atherosclerotic heart disease of native coronary artery without angina pectoris: Secondary | ICD-10-CM

## 2015-12-30 LAB — URINALYSIS, ROUTINE W REFLEX MICROSCOPIC
Bilirubin Urine: NEGATIVE
Glucose, UA: NEGATIVE mg/dL
Hgb urine dipstick: NEGATIVE
Ketones, ur: NEGATIVE mg/dL
LEUKOCYTES UA: NEGATIVE
NITRITE: NEGATIVE
PROTEIN: NEGATIVE mg/dL
SPECIFIC GRAVITY, URINE: 1.015 (ref 1.005–1.030)
pH: 7 (ref 5.0–8.0)

## 2015-12-30 LAB — BASIC METABOLIC PANEL
ANION GAP: 7 (ref 5–15)
BUN: 10 mg/dL (ref 6–20)
CALCIUM: 9 mg/dL (ref 8.9–10.3)
CO2: 28 mmol/L (ref 22–32)
CREATININE: 1.23 mg/dL (ref 0.61–1.24)
Chloride: 107 mmol/L (ref 101–111)
Glucose, Bld: 106 mg/dL — ABNORMAL HIGH (ref 65–99)
Potassium: 3.9 mmol/L (ref 3.5–5.1)
SODIUM: 142 mmol/L (ref 135–145)

## 2015-12-30 LAB — CBC
HEMATOCRIT: 40.3 % (ref 39.0–52.0)
HEMOGLOBIN: 12.9 g/dL — AB (ref 13.0–17.0)
MCH: 26.8 pg (ref 26.0–34.0)
MCHC: 32 g/dL (ref 30.0–36.0)
MCV: 83.6 fL (ref 78.0–100.0)
Platelets: 179 10*3/uL (ref 150–400)
RBC: 4.82 MIL/uL (ref 4.22–5.81)
RDW: 13.8 % (ref 11.5–15.5)
WBC: 6.6 10*3/uL (ref 4.0–10.5)

## 2015-12-30 LAB — LIPID PANEL
CHOL/HDL RATIO: 4.1 ratio
Cholesterol: 114 mg/dL (ref 0–200)
HDL: 28 mg/dL — ABNORMAL LOW (ref >40)
LDL Cholesterol: 69 mg/dL (ref 0–99)
Triglycerides: 86 mg/dL (ref <150)
VLDL: 17 mg/dL (ref 0–40)

## 2015-12-30 LAB — HEPATIC FUNCTION PANEL
ALT: 32 U/L (ref 17–63)
AST: 29 U/L (ref 15–41)
Albumin: 3.5 g/dL (ref 3.5–5.0)
Alkaline Phosphatase: 65 U/L (ref 38–126)
Bilirubin, Direct: 0.2 mg/dL (ref 0.1–0.5)
Indirect Bilirubin: 0.6 mg/dL (ref 0.3–0.9)
TOTAL PROTEIN: 6.8 g/dL (ref 6.5–8.1)
Total Bilirubin: 0.8 mg/dL (ref 0.3–1.2)

## 2015-12-30 LAB — PREALBUMIN: PREALBUMIN: 23.5 mg/dL (ref 18–38)

## 2015-12-30 LAB — HEPARIN LEVEL (UNFRACTIONATED)
Heparin Unfractionated: 0.39 IU/mL (ref 0.30–0.70)
Heparin Unfractionated: 0.59 IU/mL (ref 0.30–0.70)

## 2015-12-30 NOTE — Progress Notes (Signed)
Respiratory had questions about labs (gas) for today since his CABG isn't scheduled until 01/05/16. Darius Bumpyan Brooks PA said it was ok to reschedule as pre-surgical orders.

## 2015-12-30 NOTE — Consult Note (Signed)
[address]       [place] [address]             [phone]          CARDIOTHORACIC SURGERY CONSULTATION REPORT  PCP is MANNING, Adelene Amas., MD Referring Provider is BERRY, Delton See, MD Primary Cardiologist is Chrystie Nose, MD  Reason for consultation:  Severe multi-vessel CAD  HPI:  Patient is a 65 year old African-American male with history of coronary artery disease, hypertension, hyperlipidemia, borderline type 2 diabetes mellitus, and stage III chronic kidney disease who has been referred for surgical consultation to discuss treatment options for management of severe multivessel coronary artery disease. The patient's cardiac history dates back to 2008 when he reportedly suffered a mild acute non-ST segment elevation myocardial infarction. He underwent PCI and stenting of the left circumflex coronary artery at that time.  He has been followed for the last several years by Dr. Rennis Golden. In July 2016 he presented with symptoms consistent with unstable angina.  Catheterization performed at that time demonstrated severe ostial left circumflex coronary artery disease with "moderate" proximal left anterior descending coronary artery stenosis.  He was treated with PCI and stenting using a Synergy drug eluting stent and started on Effient for dual antiplatelet therapy.  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  The scan was felt to be high risk for ischemia. The patient underwent elective diagnostic catheterization yesterday. He was found to have high-grade ostial stenosis of the left circumflex coronary artery and 75% proximal stenosis of the left anterior descending coronary artery. Cardiothoracic surgical consultation was requested.  The patient is separated from his wife but lives locally in [place] with his girlfriend. He is a [employer] crew and works full-time as a Therapist, occupational. He does not exercise on a regular basis. He is physically limited only by mild degenerative arthritis in his knees. He states that overall he feels well. He does report that last month he experienced some mild substernal chest tightness with more strenuous physical exertion while he was at work. Symptoms resolve with rest. He also experiences exertional shortness of breath regularly, although this occurs only with more strenuous activity. The patient denies any symptoms of chest discomfort or shortness of breath with minimal activity or at rest. He has never had any nocturnal chest discomfort or shortness of breath. He denies any history of PND, orthopnea, or lower extremity edema.  Past Medical History  Diagnosis Date  . Hypertension   . Dyslipidemia   . Metabolic syndrome   . History of nuclear stress test 2014    exercise myoview; perfusion defect consistent with infarct/scar (basal/mid/apical inferior regions); low risk scan   . GERD (gastroesophageal reflux disease)   . Hepatitis     "noncontagious type"  . Pre-diabetes   . CKD (chronic kidney disease), stage III   . Kidney stones   . Arthritis     "little in my knees" (12/29/2015)  . CAD (coronary artery disease)     a. h/o stent to Cx 2008. b. cath 03/2009 demonstrated patent stent to circumflex, 60% LAD stenosis. c. [place] 06/2015: severe ostial LCx disease s/p angioplasty/Synergy stent placement. Moderate prox LAD disease, unchanged from prior, patent LCx stent, normal EF.  Marland Kitchen History of non-ST elevation myocardial infarction (NSTEMI) 03/2007    4.0 x 15 mm Vision BMS to mCFX  Past Surgical History  Procedure Laterality Date  . Transthoracic echocardiogram  04/2007    EF >55%; mild concentric LVH; borderline RV enlargement; RA & LA mildly dilated; mild MR, mild TR, mild PV regurg  . Knee arthroscopy Left 2004; 2011  . Lymph node dissection Right 1973    "groin"  . Coronary angioplasty with stent placement  03/2007    4.0  x 15-mm Vision BMS mCFX; 60% residual LAD  . Cardiac catheterization  2010    [place], LAD 60%, CFX 60-70%, stent OK, RCA OK, EF 60%  . Cardiac catheterization N/A 07/20/2015    Procedure: Left Heart Cath and Coronary Angiography;  Surgeon: Iran Ouch, MD;  Location: [facility] INVASIVE CV LAB;  Service: Cardiovascular;  Laterality: N/A;  . Cardiac catheterization N/A 07/20/2015    Procedure: Coronary Stent Intervention;  Surgeon: Iran Ouch, MD;  Location: [facility] INVASIVE CV LAB;  Service: Cardiovascular;  Laterality: N/A;  . Cardiac catheterization  12/29/2015  . Cardiac catheterization N/A 12/29/2015    Procedure: Left Heart Cath and Coronary Angiography;  Surgeon: Runell Gess, MD;  Location: [facility]VASIVE CV LAB;  Service: Cardiovascular;  Laterality: N/A;    Family History  Problem Relation Age of Onset  . Cancer - Prostate Father     Social History   Social History  . Marital Status: Married    Spouse Name: N/A  . Number of Children: 3  . Years of Education: N/A   Occupational History  . Truck driver, [employer] local & long distance    Social History Main Topics  . Smoking status: Former Smoker -- 1.00 packs/day for 35 years    Types: Cigarettes    Quit date: 03/25/2007  . Smokeless tobacco: Never Used  . Alcohol Use: 0.0 oz/week    0 Standard drinks or equivalent per week     Comment: 12/29/2015 "1 shot q other week or so; maybe"  . Drug Use: Yes    Special: Marijuana     Comment: 12/29/2015 "used marijuana maybe 1-2 times/month in 06/2015 for pain only; nothing since"  . Sexual Activity: Yes   Other Topics Concern  . Not on file   Social History Narrative   Pt lives with girlfriend.    Prior to Admission medications   Medication Sig Start Date End Date Taking? Authorizing Provider  aspirin EC 81 MG tablet Take 1 tablet (81 mg total) by mouth daily. 07/21/15  Yes Dayna N Dunn, PA-C  atorvastatin (LIPITOR) 80 MG tablet Take 80 mg by mouth daily.   Yes Historical Provider,  MD  carvedilol (COREG) 12.5 MG tablet Take 12.5 mg by mouth 2 (two) times daily with a meal.   Yes Historical Provider, MD  EFFIENT 10 MG TABS tablet TAKE 1 TABLET BY MOUTH EVERY DAY 10/17/15  Yes Chrystie Nose, MD  isosorbide mononitrate (IMDUR) 30 MG 24 hr tablet Take 0.5 tablets (15 mg total) by mouth daily. 12/20/15  Yes Luke K Kilroy, PA-C  losartan (COZAAR) 25 MG tablet Take 1 tablet (25 mg total) by mouth daily. 09/20/15  Yes Chrystie Nose, MD  nitroGLYCERIN (NITROSTAT) 0.4 MG SL tablet Place 1 tablet (0.4 mg total) under the tongue every 5 (five) minutes as needed for chest pain (up to 3 doses). 12/20/15  Yes Luke K Kilroy, PA-C  pantoprazole (PROTONIX) 40 MG tablet Take 1 tablet (40 mg total) by mouth daily as needed (acid reflux). 07/29/15  Yes Rosalio Macadamia, NP  triamterene-hydrochlorothiazide Ilsa Iha)  37.5-25 MG per capsule Take 1 capsule by mouth every morning. 08/27/15  Yes Historical Provider, MD    Current Facility-Administered Medications  Medication Dose Route Frequency Provider Last Rate Last Dose  . 0.9 %  sodium chloride infusion  250 mL Intravenous PRN Runell Gess, MD      . acetaminophen (TYLENOL) tablet 650 mg  650 mg Oral Q4H PRN Runell Gess, MD      . aspirin chewable tablet 81 mg  81 mg Oral Daily Runell Gess, MD      . aspirin EC tablet 81 mg  81 mg Oral Daily Runell Gess, MD   81 mg at 12/29/15 1826  . atorvastatin (LIPITOR) tablet 80 mg  80 mg Oral q1800 Runell Gess, MD   80 mg at 12/29/15 1826  . carvedilol (COREG) tablet 12.5 mg  12.5 mg Oral BID WC Runell Gess, MD   12.5 mg at 12/30/15 0843  . heparin ADULT infusion 100 units/mL (25000 units/250 mL)  1,400 Units/hr Intravenous Continuous Earnie Larsson, RPH 14 mL/hr at 12/30/15 0002 1,400 Units/hr at 12/30/15 0002  . losartan (COZAAR) tablet 25 mg  25 mg Oral Daily Runell Gess, MD   25 mg at 12/29/15 1826  . morphine 2 MG/ML injection 2 mg  2 mg Intravenous Q1H PRN Runell Gess, MD      . nitroGLYCERIN (NITROSTAT) SL tablet 0.4 mg  0.4 mg Sublingual Q5 min PRN Runell Gess, MD      . ondansetron [facility]) injection 4 mg  4 mg Intravenous Q6H PRN Runell Gess, MD      . pantoprazole (PROTONIX) EC tablet 40 mg  40 mg Oral Daily PRN Runell Gess, MD      . sodium chloride 0.9 % injection 3 mL  3 mL Intravenous Q12H Runell Gess, MD   3 mL at 12/29/15 2015  . sodium chloride 0.9 % injection 3 mL  3 mL Intravenous PRN Runell Gess, MD      . triamterene-hydrochlorothiazide (DYAZIDE) 37.5-25 MG per capsule 1 capsule  1 capsule Oral q morning - 10a Runell Gess, MD   1 capsule at 12/29/15 1845    No Known Allergies    Review of Systems:   General:  normal appetite, normal energy, no weight gain, no weight loss, no fever  Cardiac:  + chest pain with exertion, no chest pain at rest, + SOB with exertion, no resting SOB, no PND, no orthopnea, no palpitations, no arrhythmia, no atrial fibrillation, no LE edema, no dizzy spells, no syncope  Respiratory:  no shortness of breath, no home oxygen, no productive cough, no dry cough, no bronchitis, no wheezing, no hemoptysis, no asthma, no pain with inspiration or cough, no sleep apnea, no CPAP at night  GI:   no difficulty swallowing, no reflux, no frequent heartburn, no hiatal hernia, no abdominal pain, no constipation, no diarrhea, no hematochezia, no hematemesis, no melena  GU:   no dysuria,  no frequency, no urinary tract infection, no hematuria, no enlarged prostate, no kidney stones, + stage III chronic kidney disease  Vascular:  no pain suggestive of claudication, no pain in feet, no leg cramps, no varicose veins, no DVT, no non-healing foot ulcer  Neuro:   no stroke, no TIA's, no seizures, no headaches, no temporary blindness one eye,  no slurred speech, no peripheral neuropathy, no chronic pain, no instability of gait, no memory/cognitive dysfunction  Musculoskeletal: + arthritis - primarily  involving the knees, no joint swelling, no myalgias, no difficulty walking, normal mobility   Skin:   no rash, no itching, no skin infections, no pressure sores or ulcerations  Psych:   no anxiety, no depression, no nervousness, no unusual recent stress  Eyes:   no blurry vision, no floaters, no recent vision changes, does not wear glasses or contacts  ENT:   no hearing loss, no loose or painful teeth, no dentures, last saw dentist a few years ago  Hematologic:  no easy bruising, no abnormal bleeding, no clotting disorder, no frequent epistaxis  Endocrine:  no diabetes, does not check CBG's at home     Physical Exam:   BP 112/79 mmHg  Pulse 71  Temp(Src) 97.8 F (36.6 C) (Oral)  Resp 20  Ht 6' (1.829 m)  Wt 108.636 kg (239 lb 8 oz)  BMI 32.47 kg/m2  SpO2 98%  General:    well-appearing  HEENT:  Unremarkable   Neck:   no JVD, no bruits, no adenopathy   Chest:   clear to auscultation, symmetrical breath sounds, no wheezes, no rhonchi   CV:   RRR, no murmur   Abdomen:  soft, non-tender, no masses   Extremities:  warm, well-perfused, pulses palpable but diminished, no lower extremity edema, normal Allen's test on left side  Rectal/GU  Deferred  Neuro:   Grossly non-focal and symmetrical throughout  Skin:   Clean and dry, no rashes, no breakdown  Diagnostic Tests:  NUCLEAR MEDICINE MYOCARDIAL PERFUSION STRESS TEST  Result Notes    Notes Recorded by Chrystie Nose, MD on 12/22/2015 at 5:04 PM Thanks for seeing him [facility] and arranging for cath with Dr. Allyson Sabal.  -[place]         Vitals    Height Weight BMI (Calculated)   6' (1.829 m) 108.636 kg (239 lb 8 oz) 32.3    Study Highlights     The left ventricular ejection fraction is moderately decreased (30-44%).  Nuclear stress EF: 41%.  Blood pressure demonstrated a hypotensive response to exercise.  ST segment depression was noted during stress.  This is a high risk study.  High risk stress nuclear study with a  large, severe, partially reversible inferior/inferior lateral defect consistent with prior infarct and mild peri-infarct ischemia; EF 41 with global hypokinesis; moderate LVE; not signifcant ST depression (5 mm); ST elevation in AVR and AVL but with baseline Q waves; hypotensive response; will arrange OV and will likely need cath.     Nuclear History and Indications    History and Indications Indication for Stress Test: Evaluation of extent and severity of coronary artery disease History: MI;PTCA'S-2008 AND 06/2015;Last NUC MPI on 08/14/2013-scar;EF=59%;CKD III; Cardiac Risk Factors: History of Smoking, Hypertension, Lipids and Obesity  Symptoms: Chest Pain and DOE    Stress Findings    ECG NSR, septal MI, LVH, nonspecific ST changes. .   Stress Findings The patient exercised following the Bruce protocol.  The patient reported chest pain during the stress test. The patient experienced non-limiting angina during the stress test.   The patient requested the test to be stopped. The test was stopped because the patient complained of atypical chest pain.   Heart rate demonstrated a normal response to exercise. Blood pressure demonstrated a hypotensive response to exercise. Overall, the patient's exercise capacity was mildly impaired.   Recovery time: 9 minutes.  Duke Treadmill Score: high risk The patient's response to exercise was adequate for diagnosis.  Response to Stress ST segment depression was noted during stress.  Arrhythmias during stress: occasional PACs.  Arrhythmias during recovery: none.  Arrhythmias were not significant.  ECG was interpretable and conclusive.    Stress Measurements    Baseline Vitals  Rest HR 63 bpm    Rest BP 130/95 mmHg    Exercise Time  Exercise duration (min) 4 min    Peak Stress Vitals  Peak HR 142 bpm    Peak BP 142/79 mmHg    Exercise Data  MPHR 158 bpm    Percent HR 89 %    RPE 16     Estimated workload 5.8 METS           Nuclear Stress Measurements    LV Systolic Volume 87 mL    TID 1.37     LV Diastolic Volume 148 mL    SSS 14     SRS 9     SDS 5            Nuclear Stress Findings    Isotope administration Rest isotope was administered with an IV injection of 10.7 mCi Tc68m Sestamibi. Rest SPECT images were obtained approximately 45 minutes post tracer injection. Stress isotope was administered with an IV injection of 28.1 mCi Tc21m Sestamibi at peak exercise Stress SPECT images were obtained approximately 30 minutes post tracer injection.   Nuclear Study Quality Image quality affected due to significant extracardiac activity.   Nuclear Measurements Study was gated.   Rest Perfusion There is a defect present in the basal inferior, basal inferolateral, mid inferior, mid inferolateral and apical inferior location.   Stress Perfusion There is a defect present in the basal inferior, basal inferolateral, mid inferior, mid inferolateral and apical inferior location.   Overall Study Impression Myocardial perfusion is abnormal. This is a high risk study. Overall left ventricular systolic function was abnormal. LV cavity size is moderately enlarged. Nuclear stress EF: 41%. The left ventricular ejection fraction is moderately decreased (30-44%). Compared to the prior study, there are changes.  From: ACCF/SCAI/STS/AATS/AHA/ASNC/HFSA/SCCT 2012 Appropriate Use Criteria for Coronary Revascularization Focused Update    Wall Scoring    Stage 1 Score Index: 1.000 Percent Normal: 100.0%          The left ventricular wall motion is normal.      Stage 2 Score Index: 2.000 Percent Normal: 0.0%          The left ventricle is globally hypokinetic.            Signed    Electronically signed by Lewayne Bunting, MD on 12/20/15 at 1151 EST     Report approved and finalized on 12/20/2015 1151     CARDIAC CATHETERIZATION Procedures    Left Heart Cath and Coronary Angiography     Conclusion     Ost LAD to Prox LAD lesion, 75% stenosed.  Ost Cx to Prox Cx lesion, 99% stenosed. The lesion was previously treated with a stent (unknown type).  BHAVIN MONJARAZ is a 66 y.o. male   [id number] LOCATION: FACILITY: [facility]  PHYSICIAN: Nanetta Batty, M.D. 03/24/1951   DATE OF PROCEDURE: 12/29/2015  DATE OF DISCHARGE:     CARDIAC CATHETERIZATION     History obtained from chart review.65 year-old AA male with a history of CAD. He had a stent to the circumflex in 2008. He presented with [place] in July 2016 and was re studied. This reveled a patent mid CFX stent but a new proximal CFX stent. This was  treated with a DES and he was discharged on Effient. He had residual 60% LAD that appeared unchanged. His EF was normal.  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 Other medical problems include hypertension, dyslipidemia, and metabolic syndrome. He recently saw Dr Rennis Golden and was set up for a treadmill Myoview today so he could return to work driving. The scan was abnormal- read as high risk. 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 On his treadmill he also had ST depression with peak exercise. He presents today for cardiac catheterization via the right radial approach to define his anatomy.   IMPRESSION:Mr. Runco has 99% "in-stent restenosis" at the origin of his codominant circumflex with probably a 75% proximal "apple core" LAD stenosis. He is not diabetic. I do not think his circumflex was amenable to percutaneous revascularization. He requires coronary artery bypass grafting and may benefit from bilateral IMA's. The sheath was removed and a TR band was placed on the right wrist to achieve patent hemostasis. Effient was discontinued. The patient was begun on IV nitroglycerin and heparin as well. TCT S has been notified.  Nanetta Batty. MD, [facility] 12/29/2015 4:32 PM        Indications    Coronary artery  disease due to lipid rich plaque [I25.10 (ICD-10-CM)]   Positive cardiac stress test [R94.39 (ICD-10-CM)]    Technique and Indications    PROCEDURE DESCRIPTION:   The patient was brought to the second floor [place] . His right wristwas prepped and shaved in usual sterile fashion. Xylocaine 1% was used for local anesthesia. A 6 French sheath was inserted into the right radial artery using standard Seldinger technique. A 5 [place] TIG catheter pigtail catheters were used for selective coronary angiography and left ventriculography respectively. Visipaque dye was used for the entirety of the case. Retrograde aortic, left ventricular and pullback pressures were recorded. The patient received weight-based heparin IV as well as radial cocktail via the SideArm sheath. Estimated blood loss <50 mL. There were no immediate complications during the procedure.    Coronary Findings    Dominance: Co-dominant   Left Anterior Descending   . Ost LAD to Prox LAD lesion, 75% stenosed.     Left Circumflex   . Ost Cx to Prox Cx lesion, 99% stenosed. The lesion was previously treated with a stent (unknown type).   . Prox Cx to Mid Cx lesion, 0% stenosed. Previously placed Prox Cx to Mid Cx stent (unknown type) is patent.      Wall Motion                 Coronary Diagrams    Diagnostic Diagram            Implants    Name ID Temporary Type Supply   No information to display    PACS Images    Show images for Cardiac catheterization     Link to Procedure Log    Procedure Log      Hemo Data       Most Recent Value   AO Systolic Pressure  125 mmHg   AO Diastolic Pressure  86 mmHg   AO Mean  103 mmHg   LV Systolic Pressure  136 mmHg   LV Diastolic Pressure  7 mmHg   LV EDP  11 mmHg   Arterial Occlusion Pressure Extended Systolic Pressure  130 mmHg   Arterial Occlusion  Pressure Extended Diastolic Pressure  80 mmHg   Arterial Occlusion Pressure Extended Mean  Pressure  103 mmHg   Left Ventricular Apex Extended Systolic Pressure  140 mmHg   Left Ventricular Apex Extended Diastolic Pressure  6 mmHg   Left Ventricular Apex Extended EDP Pressure  15 mmHg      Impression:  Patient has severe multivessel coronary artery disease with normal LV systolic function and stable symptoms of exertional chest discomfort and shortness of breath.  I have personally reviewed the patient's diagnostic cardiac catheterization. There is 75% proximal stenosis of the left anterior descending coronary artery and high-grade 99% ostial stenosis of left circumflex coronary artery prior to multiple long segment stents. The right coronary artery is relatively small and free of significant disease. I agree the patient would best be treated with surgical revascularization.  The patient has been treated with Effient for dual antiplatelet therapy for more than 6 months.  Given the patient's stable clinical condition, surgical revascularization should be delayed until the effects of Effient have dissipated.   Plan:  I have reviewed the indications, risks, and potential benefits of coronary artery bypass grafting with the patient this morning.  Alternative treatment strategies have been discussed, including the relative risks, benefits and long term prognosis associated with medical therapy, percutaneous coronary intervention, and surgical revascularization.  The patient understands and accepts all potential associated risks of surgery including but not limited to risk of death, stroke or other neurologic complication, myocardial infarction, congestive heart failure, respiratory failure, renal failure, bleeding requiring blood transfusion and/or reexploration, aortic dissection or other major vascular complication, arrhythmia, heart block or bradycardia requiring permanent pacemaker, pneumonia, pleural effusion, wound infection, pulmonary embolus or other thromboembolic complication,  chronic pain or other delayed complications related to median sternotomy, or the late recurrence of symptomatic ischemic heart disease and/or congestive heart failure.  The importance of long term risk modification have been emphasized.  All questions answered.  We tentatively plan to proceed with surgery on Thursday, 01/05/2016.  I spent in excess of 120 minutes during the conduct of this hospital consultation and >50% of this time involved direct face-to-face encounter for counseling and/or coordination of the patient's care.   Salvatore Decentlarence H. Cornelius Moraswen, MD 12/30/2015 10:19 AM

## 2015-12-30 NOTE — Plan of Care (Signed)
Problem: Phase I Progression Outcomes Goal: Vascular site scale level 0 - I Vascular Site Scale Level 0: No bruising/bleeding/hematoma Level I (Mild): Bruising/Ecchymosis, minimal bleeding/ooozing, palpable hematoma < 3 cm Level II (Moderate): Bleeding not affecting hemodynamic parameters, pseudoaneurysm, palpable hematoma > 3 cm Level III (Severe) Bleeding which affects hemodynamic parameters or retroperitoneal hemorrhage  Outcome: Completed/Met Date Met:  12/30/15 Right radial site has no bruising, bleeding or hematoma. Dressing in place dry and clean. Pt denies any pain to the site.

## 2015-12-30 NOTE — Progress Notes (Signed)
CABG postponed. RT called RN to inquire if ABG that is ordered still needs to still be done or postpone until surgery rescheduled. RN to call MD and let RT know.

## 2015-12-30 NOTE — Progress Notes (Signed)
ANTICOAGULATION CONSULT NOTE   Pharmacy Consult for heparin Indication: chest pain/ACS  No Known Allergies  Patient Measurements: Height: 6' (182.9 cm) Weight: 239 lb 8 oz (108.636 kg) IBW/kg (Calculated) : 77.6 Heparin Dosing Weight: 100kg  Vital Signs: Temp: 97.8 F (36.6 C) (01/06 0500) Temp Source: Oral (01/06 0500) BP: 112/79 mmHg (01/06 0500) Pulse Rate: 71 (01/06 0500)  Labs:  Recent Labs  12/30/15 0540  HGB 12.9*  HCT 40.3  PLT 179  HEPARINUNFRC 0.39  CREATININE 1.23    Estimated Creatinine Clearance: 79.3 mL/min (by C-G formula based on Cr of 1.23).   Medical History: Past Medical History  Diagnosis Date  . Hypertension   . Dyslipidemia   . Metabolic syndrome   . History of nuclear stress test 2014    exercise myoview; perfusion defect consistent with infarct/scar (basal/mid/apical inferior regions); low risk scan   . GERD (gastroesophageal reflux disease)   . Hepatitis     "noncontagious type"  . Pre-diabetes   . CKD (chronic kidney disease), stage III   . Kidney stones   . Arthritis     "little in my knees" (12/29/2015)  . CAD (coronary artery disease)     a. h/o stent to Cx 2008. b. cath 03/2009 demonstrated patent stent to circumflex, 60% LAD stenosis. c. [place] 06[place]: severe ostial LCx disease s/p angioplasty/Synergy stent placement. Moderate prox LAD disease, unchanged from prior, patent LCx stent, normal EF.  Marland Kitchen. History of non-ST elevation myocardial infarction (NSTEMI) 03/2007    4.0 x 15 mm Vision BMS to mCFX   . heparin 1,400 Units/hr (12/30/15 0002)    Assessment: 65 year old male with multivessel CAD according to cath this afternoon. Patient was taking effient which has now been stopped, awaiting CABG consult.  Heparin level therapeutic on heparin at 1400 units/hr.  No bleeding or complications noted.  Goal of Therapy:  Heparin level 0.3-0.7 units/ml Monitor platelets by anticoagulation protocol: Yes   Plan:  Continue IV heparin at  current rate. Confirm heparin level at noon today. F/u plan for CABG.  Tad MooreJessica Gillian Kluever, Pharm D, BCPS  Clinical Pharmacist Pager [phone]  12/30/2015 9:47 AM

## 2015-12-30 NOTE — Progress Notes (Signed)
Utilization review completed.  

## 2015-12-30 NOTE — Progress Notes (Signed)
ANTICOAGULATION CONSULT NOTE   Pharmacy Consult for heparin Indication: chest pain/ACS  No Known Allergies  Patient Measurements: Height: 6' (182.9 cm) Weight: 239 lb 8 oz (108.636 kg) IBW/kg (Calculated) : 77.6 Heparin Dosing Weight: 100kg  Vital Signs: Temp: 97.8 F (36.6 C) (01/06 0500) Temp Source: Oral (01/06 0500) BP: 112/79 mmHg (01/06 0500) Pulse Rate: 71 (01/06 0500)  Labs:  Recent Labs  12/30/15 0540 12/30/15 1235  HGB 12.9*  --   HCT 40.3  --   PLT 179  --   HEPARINUNFRC 0.39 0.59  CREATININE 1.23  --     Estimated Creatinine Clearance: 79.3 mL/min (by C-G formula based on Cr of 1.23).   Medical History: Past Medical History  Diagnosis Date  . Hypertension   . Dyslipidemia   . Metabolic syndrome   . History of nuclear stress test 2014    exercise myoview; perfusion defect consistent with infarct/scar (basal/mid/apical inferior regions); low risk scan   . GERD (gastroesophageal reflux disease)   . Hepatitis     "noncontagious type"  . Pre-diabetes   . CKD (chronic kidney disease), stage III   . Kidney stones   . Arthritis     "little in my knees" (12/29/2015)  . CAD (coronary artery disease)     a. h/o stent to Cx 2008. b. cath 03/2009 demonstrated patent stent to circumflex, 60% LAD stenosis. c. [place] 06[place]2015: severe ostial LCx disease s/p angioplasty/Synergy stent placement. Moderate prox LAD disease, unchanged from prior, patent LCx stent, normal EF.  Marland Kitchen. History of non-ST elevation myocardial infarction (NSTEMI) 03/2007    4.0 x 15 mm Vision BMS to mCFX   . heparin 1,400 Units/hr (12/30/15 0002)    Assessment: 65 year old male with multivessel CAD according to cath this afternoon. Patient was taking effient which has now been stopped, awaiting CABG consult.  Heparin level therapeutic x 2 on heparin at 1400 units/hr.  No bleeding or complications noted.  Planning CABG next Thurs 1/12.  Goal of Therapy:  Heparin level 0.3-0.7 units/ml Monitor  platelets by anticoagulation protocol: Yes   Plan:  Continue IV heparin at current rate. Confirm heparin level at noon today. F/u CABG.  Tad MooreJessica Charmika Macdonnell, Pharm D, BCPS  Clinical Pharmacist Pager [phone]  12/30/2015 1:29 PM

## 2015-12-30 NOTE — Progress Notes (Signed)
DAILY PROGRESS NOTE    Subjective:  No chest pain overnight. Cath yesterday shows 99% aggressive ISR from stents placed last July in a co-dominant LCx and a 75% LAD stenosis. He is a diabetic and I agree that CABG is indicated.  Objective:  Temp:  [97.8 F (36.6 C)-98.5 F (36.9 C)] 97.8 F (36.6 C) (01/06 0500) Pulse Rate:  [71-89] 71 (01/06 0500) Resp:  [10-22] 20 (01/06 0500) BP: (112-147)/(72-106) 112/79 mmHg (01/06 0500) SpO2:  [95 %-100 %] 98 % (01/06 0500) Weight:  [238 lb (107.956 kg)-239 lb 8 oz (108.636 kg)] 239 lb 8 oz (108.636 kg) (01/06 0500) Weight change:   Intake/Output from previous day: 01/05 0701 - 01/06 0700 In: -  Out: 1450 [Urine:1450]  Intake/Output from this shift: Total I/O In: 60 [P.O.:60] Out: -   Medications: Current Facility-Administered Medications  Medication Dose Route Frequency Provider Last Rate Last Dose  . 0.9 %  sodium chloride infusion  250 mL Intravenous PRN Lorretta Harp, MD      . acetaminophen (TYLENOL) tablet 650 mg  650 mg Oral Q4H PRN Lorretta Harp, MD      . aspirin chewable tablet 81 mg  81 mg Oral Daily Lorretta Harp, MD      . aspirin EC tablet 81 mg  81 mg Oral Daily Lorretta Harp, MD   81 mg at 12/29/15 1826  . atorvastatin (LIPITOR) tablet 80 mg  80 mg Oral q1800 Lorretta Harp, MD   80 mg at 12/29/15 1826  . carvedilol (COREG) tablet 12.5 mg  12.5 mg Oral BID WC Lorretta Harp, MD   12.5 mg at 12/30/15 0843  . heparin ADULT infusion 100 units/mL (25000 units/250 mL)  1,400 Units/hr Intravenous Continuous Lyndee Leo, RPH 14 mL/hr at 12/30/15 0002 1,400 Units/hr at 12/30/15 0002  . losartan (COZAAR) tablet 25 mg  25 mg Oral Daily Lorretta Harp, MD   25 mg at 12/29/15 1826  . morphine 2 MG/ML injection 2 mg  2 mg Intravenous Q1H PRN Lorretta Harp, MD      . nitroGLYCERIN (NITROSTAT) SL tablet 0.4 mg  0.4 mg Sublingual Q5 min PRN Lorretta Harp, MD      . ondansetron [facility]) injection 4 mg   4 mg Intravenous Q6H PRN Lorretta Harp, MD      . pantoprazole (PROTONIX) EC tablet 40 mg  40 mg Oral Daily PRN Lorretta Harp, MD      . sodium chloride 0.9 % injection 3 mL  3 mL Intravenous Q12H Lorretta Harp, MD   3 mL at 12/29/15 2015  . sodium chloride 0.9 % injection 3 mL  3 mL Intravenous PRN Lorretta Harp, MD      . triamterene-hydrochlorothiazide (DYAZIDE) 37.5-25 MG per capsule 1 capsule  1 capsule Oral q morning - 10a Lorretta Harp, MD   1 capsule at 12/29/15 1845    Physical Exam: General appearance: alert and no distress Neck: no carotid bruit and no JVD Lungs: clear to auscultation bilaterally Heart: regular rate and rhythm, S1, S2 normal, no murmur, click, rub or gallop Abdomen: soft, non-tender; bowel sounds normal; no masses,  no organomegaly Extremities: extremities normal, atraumatic, no cyanosis or edema Pulses: 2+ and symmetric Skin: Skin color, texture, turgor normal. No rashes or lesions Neurologic: Grossly normal  Lab Results: Results for orders placed or performed during the hospital encounter of 12/29/15 (from the past 48 hour(s))  Basic metabolic panel     Status: Abnormal   Collection Time: 12/30/15  5:40 AM  Result Value Ref Range   Sodium 142 135 - 145 mmol/L   Potassium 3.9 3.5 - 5.1 mmol/L   Chloride 107 101 - 111 mmol/L   CO2 28 22 - 32 mmol/L   Glucose, Bld 106 (H) 65 - 99 mg/dL   BUN 10 6 - 20 mg/dL   Creatinine, Ser 1.23 0.61 - 1.24 mg/dL   Calcium 9.0 8.9 - 10.3 mg/dL   GFR calc non Af Amer >60 >60 mL/min   GFR calc Af Amer >60 >60 mL/min    Comment: (NOTE) The eGFR has been calculated using the CKD EPI equation. This calculation has not been validated in all clinical situations. eGFR's persistently <60 mL/min signify possible Chronic Kidney Disease.    Anion gap 7 5 - 15  CBC     Status: Abnormal   Collection Time: 12/30/15  5:40 AM  Result Value Ref Range   WBC 6.6 4.0 - 10.5 K/uL   RBC 4.82 4.22 - 5.81 MIL/uL    Hemoglobin 12.9 (L) 13.0 - 17.0 g/dL   HCT 40.3 39.0 - 52.0 %   MCV 83.6 78.0 - 100.0 fL   MCH 26.8 26.0 - 34.0 pg   MCHC 32.0 30.0 - 36.0 g/dL   RDW 13.8 11.5 - 15.5 %   Platelets 179 150 - 400 K/uL  Heparin level (unfractionated)     Status: None   Collection Time: 12/30/15  5:40 AM  Result Value Ref Range   Heparin Unfractionated 0.39 0.30 - 0.70 IU/mL    Comment:        IF HEPARIN RESULTS ARE BELOW EXPECTED VALUES, AND PATIENT DOSAGE HAS BEEN CONFIRMED, SUGGEST FOLLOW UP TESTING OF ANTITHROMBIN III LEVELS.     Imaging: No results found.  Assessment:  1. Active Problems: 2.   Abnormal nuclear cardiac imaging test 3.   Coronary artery disease due to lipid rich plaque 4.   CAD (coronary artery disease) 5.   Plan:  1. No further chest pain. On IV heparin and nitroglycerin gtts. Plan for CT surgery evaluation. Will need washout of Effient - likely if he is to have CABG, I suspect it will not be before next Tuesday.  Time Spent Directly with Patient:  15 minutes  Length of Stay:  LOS: 1 day   Pixie Casino, MD, [facility] Attending Cardiologist [place] 12/30/2015, 8:46 AM

## 2015-12-30 NOTE — Progress Notes (Signed)
CARDIAC REHAB PHASE I   PRE:  Rate/Rhythm: 72 SR    BP: sitting 118/83    SaO2: 97 RA  MODE:  Ambulation: 700 ft   POST:  Rate/Rhythm: 91 SR    BP: sitting 127/89     SaO2: 97 RA  Tolerated well, no c/o although seemed to get slightly fatigued at 600 ft. Gave [facility] booklet and care guide. Discussed sternal precautions, mobility, preop video, and d/c planning. Pt sts he should be able to organize care at d/c between his girlfriend and son. Pt can walk independently and we will f/u as time permits.   [phone]  Elissa LovettReeve, Luis Tucker Hummels WharfKristan CES, ACSM 12/30/2015 10:55 AM

## 2015-12-30 NOTE — Care Management Note (Signed)
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- CM will continue to follow for potential d/c needs- post op  Expected Discharge Date:                  Expected Discharge Plan:  Home/Self Care  In-House Referral:     Discharge planning Services  CM Consult  Post Acute Care Choice:    Choice offered to:     DME Arranged:    DME Agency:     HH Arranged:    HH Agency:     Status of Service:  In process, will continue to follow  Medicare Important Message Given:    Date Medicare IM Given:    Medicare IM give by:    Date Additional Medicare IM Given:    Additional Medicare Important Message give by:     If discussed at Long Length of Stay Meetings, dates discussed:    Additional Comments:  Darrold SpanWebster, Saralee Bolick Hall, RN 12/30/2015, 10:25 AM

## 2015-12-31 ENCOUNTER — Inpatient Hospital Stay (HOSPITAL_COMMUNITY): Payer: 59

## 2015-12-31 DIAGNOSIS — I2510 Atherosclerotic heart disease of native coronary artery without angina pectoris: Secondary | ICD-10-CM

## 2015-12-31 LAB — CBC
HCT: 42 % (ref 39.0–52.0)
Hemoglobin: 13.6 g/dL (ref 13.0–17.0)
MCH: 26.9 pg (ref 26.0–34.0)
MCHC: 32.4 g/dL (ref 30.0–36.0)
MCV: 83.2 fL (ref 78.0–100.0)
PLATELETS: 167 10*3/uL (ref 150–400)
RBC: 5.05 MIL/uL (ref 4.22–5.81)
RDW: 13.8 % (ref 11.5–15.5)
WBC: 6.8 10*3/uL (ref 4.0–10.5)

## 2015-12-31 LAB — HEMOGLOBIN A1C
HEMOGLOBIN A1C: 5.9 % — AB (ref 4.8–5.6)
MEAN PLASMA GLUCOSE: 123 mg/dL

## 2015-12-31 LAB — HEPARIN LEVEL (UNFRACTIONATED): HEPARIN UNFRACTIONATED: 0.74 [IU]/mL — AB (ref 0.30–0.70)

## 2015-12-31 MED ORDER — HEPARIN (PORCINE) IN NACL 100-0.45 UNIT/ML-% IJ SOLN
1000.0000 [IU]/h | INTRAMUSCULAR | Status: DC
  Administered 2015-12-31 – 2016-01-01 (×2): 1300 [IU]/h via INTRAVENOUS
  Administered 2016-01-02: 1150 [IU]/h via INTRAVENOUS
  Administered 2016-01-03: 1000 [IU]/h via INTRAVENOUS
  Filled 2015-12-31 (×4): qty 250

## 2015-12-31 NOTE — Progress Notes (Signed)
Subjective:  No definite chest pain overnight.  No shortness of breath.  Quite anxious and talkative about his medical issues.  Objective:  Vital Signs in the last 24 hours: BP 105/67 mmHg  Pulse 66  Temp(Src) 97.6 F (36.4 C) (Oral)  Resp 18  Ht 6' (1.829 m)  Wt 107.004 kg (235 lb 14.4 oz)  BMI 31.99 kg/m2  SpO2 98%  Physical Exam: Talkative obese black male in no acute distress Lungs:  Clear Cardiac:  Regular rhythm, normal S1 and S2, no S3 Abdomen:  Soft, nontender, no masses Extremities:  No edema present  Intake/Output from previous day: 01/06 0701 - 01/07 0700 In: 719.5 [P.O.:300; I.V.:419.5] Out: 1650 [Urine:1650]  Weight Filed Weights   12/29/15 1613 12/30/15 0500 12/31/15 0539  Weight: 107.956 kg (238 lb) 108.636 kg (239 lb 8 oz) 107.004 kg (235 lb 14.4 oz)    Lab Results: Basic Metabolic Panel:  Recent Labs  78/29/5600/06/09 0540  NA 142  K 3.9  CL 107  CO2 28  GLUCOSE 106*  BUN 10  CREATININE 1.23   CBC:  Recent Labs  12/30/15 0540 12/31/15 0427  WBC 6.6 6.8  HGB 12.9* 13.6  HCT 40.3 42.0  MCV 83.6 83.2  PLT 179 167   Telemetry: Sinus rhythm  Assessment/Plan:  1.  Unstable angina pectoris 2.  Severe in-stent restenosis and 75% LAD 3.  Diabetes mellitus  Recommendations:  Awaiting bypass grafting and washout of Effient.  Appreciate Dr. Orvan Julywen's evaluation.     Darden PalmerW. Spencer Tilley, Jr.  MD [facility] Cardiology  12/31/2015, 9:04 AM

## 2015-12-31 NOTE — Progress Notes (Signed)
Pre-op Cardiac Surgery  Carotid Findings:  1-39% ICA stenosis. Vertebral artery flow is antegrade.   Upper Extremity Right Left  Brachial Pressures 108T 103T  Radial Waveforms T T  Ulnar Waveforms T T  Palmar Arch (Allen's Test) WNL Doppler signal remains normal with radial compression and nearly obliterates with ulnar compression   Findings:      Lower  Extremity Right Left  Dorsalis Pedis    Anterior Tibial T T  Posterior Tibial T T  Ankle/Brachial Indices      Findings:

## 2015-12-31 NOTE — Progress Notes (Signed)
ANTICOAGULATION CONSULT NOTE   Pharmacy Consult for heparin Indication: chest pain/ACS  No Known Allergies  Patient Measurements: Height: 6' (182.9 cm) Weight: 235 lb 14.4 oz (107.004 kg) IBW/kg (Calculated) : 77.6 Heparin Dosing Weight: 100kg  Vital Signs: Temp: 97.6 F (36.4 C) (01/07 0539) Temp Source: Oral (01/07 0539) BP: 105/67 mmHg (01/07 0539) Pulse Rate: 66 (01/07 0539)  Labs:  Recent Labs  12/30/15 0540 12/30/15 1235 12/31/15 0427  HGB 12.9*  --  13.6  HCT 40.3  --  42.0  PLT 179  --  167  HEPARINUNFRC 0.39 0.59 0.74*  CREATININE 1.23  --   --     Estimated Creatinine Clearance: 78.7 mL/min (by C-G formula based on Cr of 1.23).   Medical History: Past Medical History  Diagnosis Date  . Hypertension   . Dyslipidemia   . Metabolic syndrome   . History of nuclear stress test 2014    exercise myoview; perfusion defect consistent with infarct/scar (basal/mid/apical inferior regions); low risk scan   . GERD (gastroesophageal reflux disease)   . Hepatitis     "noncontagious type"  . Pre-diabetes   . CKD (chronic kidney disease), stage III   . Kidney stones   . Arthritis     "little in my knees" (12/29/2015)  . CAD (coronary artery disease)     a. h/o stent to Cx 2008. b. cath 03/2009 demonstrated patent stent to circumflex, 60% LAD stenosis. c. [place] 06[place]: severe ostial LCx disease s/p angioplasty/Synergy stent placement. Moderate prox LAD disease, unchanged from prior, patent LCx stent, normal EF.  Marland Kitchen. History of non-ST elevation myocardial infarction (NSTEMI) 03/2007    4.0 x 15 mm Vision BMS to mCFX   . heparin      Assessment: 65 year old male with multivessel CAD according to cath this afternoon. Patient was taking effient which has now been stopped, awaiting CABG consult.  Heparin level creeping up on heparin at 1400 units/hr.  No bleeding or complications noted.  CBC stable.  Planning CABG next Thurs 1/12.  Goal of Therapy:  Heparin level 0.3-0.7  units/ml Monitor platelets by anticoagulation protocol: Yes   Plan:  Decrease IV heparin gtt rate to 1300 units/hr. Continue daily heparin level and CBC.  Tad MooreJessica Bryten Maher, Pharm D, BCPS  Clinical Pharmacist Pager [phone]  12/31/2015 7:29 AM

## 2016-01-01 LAB — CBC
HEMATOCRIT: 43.2 % (ref 39.0–52.0)
HEMOGLOBIN: 14 g/dL (ref 13.0–17.0)
MCH: 27.1 pg (ref 26.0–34.0)
MCHC: 32.4 g/dL (ref 30.0–36.0)
MCV: 83.7 fL (ref 78.0–100.0)
Platelets: 179 10*3/uL (ref 150–400)
RBC: 5.16 MIL/uL (ref 4.22–5.81)
RDW: 13.7 % (ref 11.5–15.5)
WBC: 7.2 10*3/uL (ref 4.0–10.5)

## 2016-01-01 LAB — HEPARIN LEVEL (UNFRACTIONATED)
HEPARIN UNFRACTIONATED: 0.75 [IU]/mL — AB (ref 0.30–0.70)
Heparin Unfractionated: 0.48 IU/mL (ref 0.30–0.70)

## 2016-01-01 NOTE — Progress Notes (Signed)
ANTICOAGULATION CONSULT NOTE - Follow Up Consult  Pharmacy Consult for Heparin  Indication: Awaiting CABG  No Known Allergies  Patient Measurements: Height: 6' (182.9 cm) Weight: 237 lb (107.502 kg) IBW/kg (Calculated) : 77.6  Vital Signs: Temp: 97.6 F (36.4 C) (01/08 0505) Temp Source: Oral (01/08 0505) BP: 108/80 mmHg (01/08 0505) Pulse Rate: 79 (01/08 0505)  Labs:  Recent Labs  12/30/15 0540  12/31/15 0427 01/01/16 0540 01/01/16 1425  HGB 12.9*  --  13.6 14.0  --   HCT 40.3  --  42.0 43.2  --   PLT 179  --  167 179  --   HEPARINUNFRC 0.39  < > 0.74* 0.75* 0.48  CREATININE 1.23  --   --   --   --   < > = values in this interval not displayed.  Estimated Creatinine Clearance: 78.9 mL/min (by C-G formula based on Cr of 1.23).   Assessment: [age] YOM continues on heparin awaiting CABG. Heparin level now therapeutic at 0.48 units/mL. CBC stable, no bleeding noted.  Goal of Therapy:  Heparin level 0.3-0.7 units/ml Monitor platelets by anticoagulation protocol: Yes   Plan:  -Continue heparin at 1150 units/hr -daily HL and CBC -follow s/s bleeding  Palestine Mosco D. Codey Burling, PharmD, BCPS Clinical Pharmacist Pager: [phone] 01/01/2016 3:43 PM

## 2016-01-01 NOTE — Progress Notes (Signed)
Subjective:  No recurrent chest pain or shortness of breath.  Objective:  Vital Signs in the last 24 hours: BP 108/80 mmHg  Pulse 79  Temp(Src) 97.6 F (36.4 C) (Oral)  Resp 18  Ht 6' (1.829 m)  Wt 107.502 kg (237 lb)  BMI 32.14 kg/m2  SpO2 97%  Physical Exam: Talkative obese black male in no acute distress Lungs:  Clear Cardiac:  Regular rhythm, normal S1 and S2, no S3 Abdomen:  Soft, nontender, no masses Extremities:  No edema present  Intake/Output from previous day: 01/07 0701 - 01/08 0700 In: 1020.5 [P.O.:720; I.V.:300.5] Out: 900 [Urine:900]  Weight Filed Weights   12/30/15 0500 12/31/15 0539 01/01/16 0505  Weight: 108.636 kg (239 lb 8 oz) 107.004 kg (235 lb 14.4 oz) 107.502 kg (237 lb)    Lab Results: Basic Metabolic Panel:  Recent Labs  11/91/4700/06/09 0540  NA 142  K 3.9  CL 107  CO2 28  GLUCOSE 106*  BUN 10  CREATININE 1.23   CBC:  Recent Labs  12/31/15 0427 01/01/16 0540  WBC 6.8 7.2  HGB 13.6 14.0  HCT 42.0 43.2  MCV 83.2 83.7  PLT 167 179   Telemetry: Sinus rhythm  Assessment/Plan:  1.  Unstable angina pectoris 2.  Severe in-stent restenosis and 75% LAD 3.  Diabetes mellitus  Recommendations:  Awaiting bypass grafting and washout of Effient.  Questions answered.     Darden PalmerW. Spencer Calin Ellery, Jr.  MD [facility] Cardiology  01/01/2016, 9:18 AM

## 2016-01-01 NOTE — Progress Notes (Signed)
ANTICOAGULATION CONSULT NOTE - Follow Up Consult  Pharmacy Consult for Heparin  Indication: Awaiting CABG  No Known Allergies  Patient Measurements: Height: 6' (182.9 cm) Weight: 237 lb (107.502 kg) IBW/kg (Calculated) : 77.6  Vital Signs: Temp: 97.6 F (36.4 C) (01/08 0505) Temp Source: Oral (01/08 0505) BP: 108/80 mmHg (01/08 0505) Pulse Rate: 79 (01/08 0505)  Labs:  Recent Labs  12/30/15 0540 12/30/15 1235 12/31/15 0427 01/01/16 0540  HGB 12.9*  --  13.6 14.0  HCT 40.3  --  42.0 43.2  PLT 179  --  167 179  HEPARINUNFRC 0.39 0.59 0.74* 0.75*  CREATININE 1.23  --   --   --     Estimated Creatinine Clearance: 78.9 mL/min (by C-G formula based on Cr of 1.23).   Assessment: Heparin level remains elevated, no issues per RN.   Goal of Therapy:  Heparin level 0.3-0.7 units/ml Monitor platelets by anticoagulation protocol: Yes   Plan:  -Decrease heparin to 1150 units/hr -1400 HL  Telia Amundson 01/01/2016,6:50 AM

## 2016-01-02 DIAGNOSIS — I25110 Atherosclerotic heart disease of native coronary artery with unstable angina pectoris: Secondary | ICD-10-CM

## 2016-01-02 DIAGNOSIS — I200 Unstable angina: Secondary | ICD-10-CM | POA: Diagnosis present

## 2016-01-02 LAB — CBC
HCT: 45.3 % (ref 39.0–52.0)
HEMOGLOBIN: 15 g/dL (ref 13.0–17.0)
MCH: 27.5 pg (ref 26.0–34.0)
MCHC: 33.1 g/dL (ref 30.0–36.0)
MCV: 83.1 fL (ref 78.0–100.0)
PLATELETS: 183 10*3/uL (ref 150–400)
RBC: 5.45 MIL/uL (ref 4.22–5.81)
RDW: 13.7 % (ref 11.5–15.5)
WBC: 7.9 10*3/uL (ref 4.0–10.5)

## 2016-01-02 LAB — HEPARIN LEVEL (UNFRACTIONATED)
HEPARIN UNFRACTIONATED: 1.03 [IU]/mL — AB (ref 0.30–0.70)
HEPARIN UNFRACTIONATED: 0.85 [IU]/mL — AB (ref 0.30–0.70)
Heparin Unfractionated: 0.47 IU/mL (ref 0.30–0.70)

## 2016-01-02 MED ORDER — CARVEDILOL 25 MG PO TABS
25.0000 mg | ORAL_TABLET | Freq: Two times a day (BID) | ORAL | Status: DC
  Administered 2016-01-02 – 2016-01-03 (×3): 25 mg via ORAL
  Filled 2016-01-02 (×3): qty 1

## 2016-01-02 MED ORDER — ALPRAZOLAM 0.25 MG PO TABS
0.2500 mg | ORAL_TABLET | Freq: Two times a day (BID) | ORAL | Status: DC | PRN
  Filled 2016-01-02: qty 1

## 2016-01-02 MED ORDER — ISOSORBIDE MONONITRATE ER 30 MG PO TB24
30.0000 mg | ORAL_TABLET | Freq: Every day | ORAL | Status: DC
  Administered 2016-01-02 – 2016-01-03 (×2): 30 mg via ORAL
  Filled 2016-01-02 (×2): qty 1

## 2016-01-02 NOTE — Progress Notes (Addendum)
ANTICOAGULATION CONSULT NOTE - Follow Up Consult  Pharmacy Consult for Heparin Indication: CAD, awaiting CABG  No Known Allergies  Patient Measurements: Height: 6' (182.9 cm) Weight: 233 lb 8 oz (105.915 kg) IBW/kg (Calculated) : 77.6 Heparin Dosing Weight: 100 kg  Vital Signs: Temp: 97.5 F (36.4 C) (01/09 0500) Temp Source: Oral (01/09 0500) BP: 126/79 mmHg (01/09 0127) Pulse Rate: 80 (01/09 0500)  Labs:  Recent Labs  12/31/15 0427 01/01/16 0540 01/01/16 1425 01/02/16 0813 01/02/16 0815  HGB 13.6 14.0  --  15.0  --   HCT 42.0 43.2  --  45.3  --   PLT 167 179  --  183  --   HEPARINUNFRC 0.74* 0.75* 0.48  --  1.03*    Estimated Creatinine Clearance: 78.3 mL/min (by C-G formula based on Cr of 1.23).  Assessment:   Awaiting CABG on 01/05/16, after Effient washout.   Heparin level is supratherapeutic (1.03) on 1150 units/hr this am.   Level was therapeutic (0.48) on same rate last night, after decrease rate 1/8 am due to trending up to 1300 units/hr.  No known infusion problems, labs drawn in opposite hand this am.  No bleeding.  Goal of Therapy:  Heparin level 0.3-0.7 units/ml Monitor platelets by anticoagulation protocol: Yes   Plan:   Continue heparin drip at 1150 units/hr.  Will repeat heparin level before making any adjustments.  Daily heparin level and CBC while on heparin.  Dennie Fettersgan, Basil Buffin Donovan, [place] Pager: [phone] 01/02/2016,11:02 AM  Addendum:   Repeat heparin level = 0.85.  Lower, but still above goal.   Will decrease heparin drip to 1000 units/hr.   Recheck heparin level in ~6 hrs.  Hilarie Fredrickson. Rayne Cowdrey, RPh 2:14 PM

## 2016-01-02 NOTE — Progress Notes (Addendum)
Subjective: Some chest pain during the night 1 SL NTG with relief.  + anxiety at waiting for surgery  Objective: Vital signs in last 24 hours: Temp:  [97.5 F (36.4 C)-98.5 F (36.9 C)] 97.5 F (36.4 C) (01/09 0500) Pulse Rate:  [68-80] 80 (01/09 0500) Resp:  [18-20] 20 (01/08 2000) BP: (101-133)/(73-79) 126/79 mmHg (01/09 0127) SpO2:  [97 %-100 %] 100 % (01/09 0500) Weight:  [233 lb 8 oz (105.915 kg)] 233 lb 8 oz (105.915 kg) (01/09 0500) Weight change: -3 lb 8 oz (-1.588 kg) Last BM Date: 01/01/16 Intake/Output from previous day: -2957 01/08 0701 - 01/09 0700 In: 502.9 [P.O.:360; I.V.:142.9] Out: 1200 [Urine:1200] Intake/Output this shift:    PE: General:Pleasant affect, NAD Skin:Warm and dry, brisk capillary refill HEENT:normocephalic, sclera clear, mucus membranes moist Neck:supple, no JVD, no bruits  Heart:S1S2 RRR without murmur, gallup, rub or click Lungs:clear without rales, rhonchi, or wheezes NWG:NFAOAbd:soft, non tender, + BS, do not palpate liver spleen or masses Ext:no lower ext edema, 2+ pedal pulses, 2+ radial pulses Neuro:alert and oriented, MAE, follows commands, + facial symmetry TELE:  SR to ST up to 120 early AM.   Lab Results:  Recent Labs  12/31/15 0427 01/01/16 0540  WBC 6.8 7.2  HGB 13.6 14.0  HCT 42.0 43.2  PLT 167 179   BMET No results for input(s): NA, K, CL, CO2, GLUCOSE, BUN, CREATININE, CALCIUM in the last 72 hours.  Invalid input(s): MAGNESIUM No results for input(s): TROPONINI in the last 72 hours.  Invalid input(s): CK, MB  Lab Results  Component Value Date   CHOL 114 12/30/2015   HDL 28* 12/30/2015   LDLCALC 69 12/30/2015   TRIG 86 12/30/2015   CHOLHDL 4.1 12/30/2015   Lab Results  Component Value Date   HGBA1C 5.9* 12/30/2015     Lab Results  Component Value Date   TSH 1.072 12/20/2015    Hepatic Function Panel  Recent Labs  12/30/15 0907  PROT 6.8  ALBUMIN 3.5  AST 29  ALT 32  ALKPHOS 65    BILITOT 0.8  BILIDIR 0.2  IBILI 0.6    Recent Labs  12/30/15 0907  CHOL 114   No results for input(s): PROTIME in the last 72 hours.     Studies/Results: Carotid dopplers: Bilateral: intimal wall thickening CCA. Mild soft plaque origin ICA. 1-39% ICA plaquing. Vertebral artery flow is antegrade   Medications: I have reviewed the patient's current medications. Scheduled Meds: . aspirin EC  81 mg Oral Daily  . atorvastatin  80 mg Oral q1800  . carvedilol  12.5 mg Oral BID WC  . losartan  25 mg Oral Daily  . sodium chloride  3 mL Intravenous Q12H  . triamterene-hydrochlorothiazide  1 capsule Oral q morning - 10a   Continuous Infusions: . heparin 1,150 Units/hr (01/02/16 0605)   PRN Meds:.sodium chloride, acetaminophen, morphine injection, nitroGLYCERIN, ondansetron (ZOFRAN) IV, pantoprazole, sodium chloride  Assessment/Plan: 65 year-old AA male with a history of CAD. He had a stent to the circumflex in 2008. He presented with [place] in July 2016 and was re studied. This reveled a patent mid CFX stent but a new proximal CFX stent. This was treated with a DES and he was discharged on Effient. He had residual 60% LAD that appeared unchanged. His EF was normal.  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 Other medical problems include  hypertension, dyslipidemia, and metabolic syndrome. He recently saw Dr Rennis Golden and was set up for a treadmill Myoview today so he could return to work driving. The scan was abnormal- read as high risk. 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 On his treadmill he also had ST depression with peak exercise.  Cath with significant disease and plans for CABG after efient washout.    Principal Problem:   Abnormal nuclear cardiac imaging test Active Problems:   Coronary artery disease due to lipid rich plaque   CAD (coronary artery disease) unstable angina  On IV heparin will add imdur and prn  xanax for anxiety.  Check EKG.    LOS: 4 days   Time spent with pt. : 15 minutes. [facility] R  Nurse Practitioner Certified Pager [phone] or after 5pm and on weekends call [phone] 01/02/2016, 8:03 AM   Patient seen and examined and history reviewed. Agree with above findings and plan. Patient had recurrent chest pain last night. Relieved with one sl Ntg. No pain now. Ecg is pending. Noted to have sinus tachy on monitor. May benefit from increased beta blocker if BP allows. Will hold triamterene/HCTZ.  Need to let Effient wash out prior to CABG. Last dose Wed. Jan 4. If he has recurrent chest pain will need to resume IV Ntg.   Peter [place], MDFACC 01/02/2016 9:13 AM

## 2016-01-02 NOTE — Progress Notes (Signed)
CARDIAC REHAB PHASE I   PRE:  Rate/Rhythm: 80 SR  BP:  Sitting: 85/70 R arm, 87/65 L arm        SaO2: 97 RA  MODE:  Ambulation: 350 ft   POST:  Rate/Rhythm: 84 SR  BP:  Sitting: 102/72         SaO2: 97 RA  Pt up in chair, states he walked in the hallway this morning, willing to walk again. BP lower than normal, pt denies any symptoms. Pt ambulated 350 ft on RA, IV, standby assist, steady gait, tolerated well. Pt denies CP, dizziness, declined rest stop. BP improved with ambulation. Pt to recliner after walk, call bell within reach. Will follow as low priority pre-op as pt is able to ambulate independently.    [phone] Joylene GrapesMonge, Jalani Cullifer C, RN, BSN 01/02/2016 10:50 AM

## 2016-01-02 NOTE — Care Management Note (Addendum)
  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- CM will continue to follow for potential d/c needs- post op  Expected Discharge Date:     Expected Discharge Plan: Home/Self Care  In-House Referral:    Discharge planning Services CM Consult  Post Acute Care Choice:   Choice offered to:    DME Arranged:   DME Agency:    HH Arranged:   HH Agency:    Status of Service: In process, will continue to follow  Medicare Important Message Given:   Date Medicare IM Given:   Medicare IM give by:   Date Additional Medicare IM Given:   Additional Medicare Important Message give by:    If discussed at Long Length of Stay Meetings, dates discussed:   Additional Comments: 1323 01-02-16 Tomi BambergerBrenda Graves-Bigelow, RN,BSN [phone] Pt with abnormal Stress test- post cath-revealed significant disease. Plan for Effient washout and CABG on Thursday. CM will continue to monitor for disposition needs.

## 2016-01-02 NOTE — Progress Notes (Signed)
UR Completed Natasha Paulson Graves-Bigelow, RN,BSN [phone]  

## 2016-01-02 NOTE — Progress Notes (Signed)
ANTICOAGULATION CONSULT NOTE - Follow Up Consult  Pharmacy Consult for Heparin  Indication: Awaiting CABG  No Known Allergies  Patient Measurements: Height: 6' (182.9 cm) Weight: 233 lb 8 oz (105.915 kg) IBW/kg (Calculated) : 77.6  Vital Signs: Temp: 98.1 F (36.7 C) (01/09 2104) Temp Source: Oral (01/09 2104) BP: 102/77 mmHg (01/09 2104) Pulse Rate: 74 (01/09 2104)  Labs:  Recent Labs  12/31/15 0427 01/01/16 0540  01/02/16 0813 01/02/16 0815 01/02/16 1300 01/02/16 2100  HGB 13.6 14.0  --  15.0  --   --   --   HCT 42.0 43.2  --  45.3  --   --   --   PLT 167 179  --  183  --   --   --   HEPARINUNFRC 0.74* 0.75*  < >  --  1.03* 0.85* 0.47  < > = values in this interval not displayed.  Estimated Creatinine Clearance: 78.3 mL/min (by C-G formula based on Cr of 1.23).   Assessment: [age] YOM continues on heparin awaiting CABG - planned for 1/12 after Effient wash-out. Heparin level therapeutic at 0.47 units/mL tonight after requiring a dose decrease. CBC stable, no bleeding noted.  Goal of Therapy:  Heparin level 0.3-0.7 units/ml Monitor platelets by anticoagulation protocol: Yes   Plan:  -Continue heparin at 1000 units/hr -daily HL and CBC -follow s/s bleeding  Asa Baudoin D. Oakleigh Hesketh, PharmD, BCPS Clinical Pharmacist Pager: [phone] 01/02/2016 9:43 PM

## 2016-01-02 NOTE — Progress Notes (Signed)
Pt complained of mild chest tightness VS stable(see flowsheet), O2/[place] 2L applied and 1 SL ntg given with complete relief. Will continue to monitor. Dierdre HighmanHall, Mearl Harewood Marie, RN

## 2016-01-02 NOTE — Progress Notes (Signed)
      [address]       [place] [id number]             [phone]     CARDIOTHORACIC SURGERY PROGRESS NOTE  4 Days Post-Op  S/P Procedure(s) (LRB): Left Heart Cath and Coronary Angiography (N/A)  Subjective: 1 episode SSCP relieved by SL NTG.  Otherwise feels fine.  Objective: Vital signs in last 24 hours: Temp:  [97.5 F (36.4 C)-98.5 F (36.9 C)] 97.5 F (36.4 C) (01/09 0500) Pulse Rate:  [68-80] 80 (01/09 0500) Cardiac Rhythm:  [-] Normal sinus rhythm (01/09 0728) Resp:  [18-20] 20 (01/08 2000) BP: (101-133)/(73-79) 126/79 mmHg (01/09 0127) SpO2:  [97 %-100 %] 100 % (01/09 0500) Weight:  [105.915 kg (233 lb 8 oz)] 105.915 kg (233 lb 8 oz) (01/09 0500)  Physical Exam:  Rhythm:   sinus  Breath sounds: clear  Heart sounds:  RRR  Incisions:  n/a  Abdomen:  soft  Extremities:  warm   Intake/Output from previous day: 01/08 0701 - 01/09 0700 In: 502.9 [P.O.:360; I.V.:142.9] Out: 1200 [Urine:1200] Intake/Output this shift:    Lab Results:  Recent Labs  01/01/16 0540 01/02/16 0813  WBC 7.2 7.9  HGB 14.0 15.0  HCT 43.2 45.3  PLT 179 183   BMET: No results for input(s): NA, K, CL, CO2, GLUCOSE, BUN, CREATININE, CALCIUM in the last 72 hours.  CBG (last 3)  No results for input(s): GLUCAP in the last 72 hours. PT/INR:  No results for input(s): LABPROT, INR in the last 72 hours.  CXR:  CHEST 2 VIEW  COMPARISON: 12/21/2015 chest radiograph.  FINDINGS: Stable cardiomediastinal silhouette with normal heart size. No pneumothorax. No pleural effusion. Clear lungs, with no focal lung consolidation and no pulmonary edema.  IMPRESSION: No active cardiopulmonary disease.   Electronically Signed  By: Delbert PhenixJason A Poff M.D.  On: 12/31/2015 08:27  Assessment/Plan: S/P Procedure(s) (LRB): Left Heart Cath and Coronary Angiography (N/A)  Clinically stable Tentatively for CABG on Thursday 1/12  I spent in excess of 15 minutes during the  conduct of this hospital encounter and >50% of this time involved direct face-to-face encounter with the patient for counseling and/or coordination of their care.   Purcell Nailslarence H Taiwo Fish, MD 01/02/2016 9:43 AM

## 2016-01-03 ENCOUNTER — Inpatient Hospital Stay (HOSPITAL_COMMUNITY): Payer: 59

## 2016-01-03 DIAGNOSIS — M7552 Bursitis of left shoulder: Secondary | ICD-10-CM | POA: Diagnosis present

## 2016-01-03 DIAGNOSIS — I200 Unstable angina: Secondary | ICD-10-CM

## 2016-01-03 LAB — BLOOD GAS, ARTERIAL
ACID-BASE DEFICIT: 0.6 mmol/L (ref 0.0–2.0)
Bicarbonate: 23.6 mEq/L (ref 20.0–24.0)
DRAWN BY: 398661
FIO2: 0.21
O2 SAT: 95.6 %
PATIENT TEMPERATURE: 98.6
PCO2 ART: 38.8 mmHg (ref 35.0–45.0)
TCO2: 24.8 mmol/L (ref 0–100)
pH, Arterial: 7.4 (ref 7.350–7.450)
pO2, Arterial: 83.7 mmHg (ref 80.0–100.0)

## 2016-01-03 LAB — TYPE AND SCREEN
ABO/RH(D): A POS
ANTIBODY SCREEN: NEGATIVE

## 2016-01-03 LAB — PULMONARY FUNCTION TEST
DL/VA % PRED: 104 %
DL/VA: 4.69 ml/min/mmHg/L
DLCO UNC % PRED: 99 %
DLCO UNC: 29.54 ml/min/mmHg
DLCO cor % pred: 99 %
DLCO cor: 29.63 ml/min/mmHg
FEF 25-75 PRE: 2.68 L/s
FEF 25-75 Post: 6.12 L/sec
FEF2575-%Change-Post: 128 %
FEF2575-%Pred-Post: 234 %
FEF2575-%Pred-Pre: 102 %
FEV1-%Change-Post: 31 %
FEV1-%PRED-POST: 139 %
FEV1-%PRED-PRE: 105 %
FEV1-PRE: 2.99 L
FEV1-Post: 3.94 L
FEV1FVC-%Change-Post: 11 %
FEV1FVC-%Pred-Pre: 98 %
FEV6-%CHANGE-POST: 19 %
FEV6-%PRED-POST: 131 %
FEV6-%PRED-PRE: 109 %
FEV6-POST: 4.62 L
FEV6-PRE: 3.85 L
FEV6FVC-%CHANGE-POST: 1 %
FEV6FVC-%PRED-PRE: 103 %
FEV6FVC-%Pred-Post: 104 %
FVC-%Change-Post: 18 %
FVC-%Pred-Post: 125 %
FVC-%Pred-Pre: 106 %
FVC-Post: 4.62 L
FVC-Pre: 3.89 L
POST FEV6/FVC RATIO: 100 %
Post FEV1/FVC ratio: 85 %
Pre FEV1/FVC ratio: 77 %
Pre FEV6/FVC Ratio: 99 %
RV % PRED: 85 %
RV: 1.86 L
TLC % pred: 97 %
TLC: 6.43 L

## 2016-01-03 LAB — SURGICAL PCR SCREEN
MRSA, PCR: NEGATIVE
Staphylococcus aureus: POSITIVE — AB

## 2016-01-03 LAB — CBC
HCT: 43.9 % (ref 39.0–52.0)
HEMOGLOBIN: 14.5 g/dL (ref 13.0–17.0)
MCH: 27.6 pg (ref 26.0–34.0)
MCHC: 33 g/dL (ref 30.0–36.0)
MCV: 83.5 fL (ref 78.0–100.0)
PLATELETS: 155 10*3/uL (ref 150–400)
RBC: 5.26 MIL/uL (ref 4.22–5.81)
RDW: 13.6 % (ref 11.5–15.5)
WBC: 8.6 10*3/uL (ref 4.0–10.5)

## 2016-01-03 LAB — BASIC METABOLIC PANEL
ANION GAP: 10 (ref 5–15)
BUN: 20 mg/dL (ref 6–20)
CO2: 25 mmol/L (ref 22–32)
Calcium: 9 mg/dL (ref 8.9–10.3)
Chloride: 101 mmol/L (ref 101–111)
Creatinine, Ser: 1.39 mg/dL — ABNORMAL HIGH (ref 0.61–1.24)
GFR calc Af Amer: >60 mL/min (ref >60)
GFR calc non Af Amer: 53 mL/min — ABNORMAL LOW (ref >60)
GLUCOSE: 99 mg/dL (ref 65–99)
POTASSIUM: 3.8 mmol/L (ref 3.5–5.1)
Sodium: 136 mmol/L (ref 135–145)

## 2016-01-03 LAB — HEPARIN LEVEL (UNFRACTIONATED): Heparin Unfractionated: 0.48 IU/mL (ref 0.30–0.70)

## 2016-01-03 LAB — APTT: APTT: 100 s — AB (ref 24–37)

## 2016-01-03 LAB — ABO/RH: ABO/RH(D): A POS

## 2016-01-03 LAB — PROTIME-INR
INR: 1.24 (ref 0.00–1.49)
PROTHROMBIN TIME: 15.8 s — AB (ref 11.6–15.2)

## 2016-01-03 LAB — PLATELET INHIBITION P2Y12: Platelet Function  P2Y12: 205 [PRU] (ref 194–418)

## 2016-01-03 MED ORDER — NITROGLYCERIN IN D5W 200-5 MCG/ML-% IV SOLN
2.0000 ug/min | INTRAVENOUS | Status: AC
  Administered 2016-01-04: 10 ug/min via INTRAVENOUS
  Filled 2016-01-03: qty 250

## 2016-01-03 MED ORDER — DEXMEDETOMIDINE HCL IN NACL 400 MCG/100ML IV SOLN
0.1000 ug/kg/h | INTRAVENOUS | Status: AC
  Administered 2016-01-04: .3 ug/kg/h via INTRAVENOUS
  Filled 2016-01-03: qty 100

## 2016-01-03 MED ORDER — PLASMA-LYTE 148 IV SOLN
INTRAVENOUS | Status: AC
  Administered 2016-01-04: 500 mL
  Filled 2016-01-03: qty 2.5

## 2016-01-03 MED ORDER — CHLORHEXIDINE GLUCONATE 4 % EX LIQD
60.0000 mL | Freq: Once | CUTANEOUS | Status: AC
  Administered 2016-01-03: 4 via TOPICAL
  Filled 2016-01-03: qty 60

## 2016-01-03 MED ORDER — MAGNESIUM SULFATE 50 % IJ SOLN
40.0000 meq | INTRAMUSCULAR | Status: DC
  Filled 2016-01-03: qty 10

## 2016-01-03 MED ORDER — POTASSIUM CHLORIDE 2 MEQ/ML IV SOLN
80.0000 meq | INTRAVENOUS | Status: DC
  Filled 2016-01-03: qty 40

## 2016-01-03 MED ORDER — TEMAZEPAM 15 MG PO CAPS: 15.0000 mg | ORAL_CAPSULE | Freq: Once | ORAL | Status: DC | PRN

## 2016-01-03 MED ORDER — DEXTROSE 5 % IV SOLN
750.0000 mg | INTRAVENOUS | Status: DC
  Filled 2016-01-03: qty 750

## 2016-01-03 MED ORDER — DOPAMINE-DEXTROSE 3.2-5 MG/ML-% IV SOLN
0.0000 ug/kg/min | INTRAVENOUS | Status: DC
  Filled 2016-01-03: qty 250

## 2016-01-03 MED ORDER — ALBUTEROL SULFATE (2.5 MG/3ML) 0.083% IN NEBU
2.5000 mg | INHALATION_SOLUTION | Freq: Once | RESPIRATORY_TRACT | Status: AC
  Administered 2016-01-03: 2.5 mg via RESPIRATORY_TRACT

## 2016-01-03 MED ORDER — VANCOMYCIN HCL 10 G IV SOLR
1250.0000 mg | INTRAVENOUS | Status: AC
  Administered 2016-01-04: 1250 mg via INTRAVENOUS
  Filled 2016-01-03 (×2): qty 1250

## 2016-01-03 MED ORDER — BISACODYL 5 MG PO TBEC
5.0000 mg | DELAYED_RELEASE_TABLET | Freq: Once | ORAL | Status: AC
  Administered 2016-01-03: 5 mg via ORAL

## 2016-01-03 MED ORDER — OXYCODONE-ACETAMINOPHEN 5-325 MG PO TABS
2.0000 | ORAL_TABLET | Freq: Four times a day (QID) | ORAL | Status: DC | PRN
  Administered 2016-01-04: 2 via ORAL
  Filled 2016-01-03: qty 2

## 2016-01-03 MED ORDER — IBUPROFEN 200 MG PO TABS
400.0000 mg | ORAL_TABLET | Freq: Four times a day (QID) | ORAL | Status: DC
  Administered 2016-01-03 (×4): 400 mg via ORAL
  Filled 2016-01-03 (×4): qty 2

## 2016-01-03 MED ORDER — SODIUM CHLORIDE 0.9 % IV SOLN
INTRAVENOUS | Status: DC
  Filled 2016-01-03: qty 30

## 2016-01-03 MED ORDER — CHLORHEXIDINE GLUCONATE 4 % EX LIQD
60.0000 mL | Freq: Once | CUTANEOUS | Status: AC
  Administered 2016-01-04: 4 via TOPICAL
  Filled 2016-01-03: qty 60

## 2016-01-03 MED ORDER — EPINEPHRINE HCL 1 MG/ML IJ SOLN
0.0000 ug/min | INTRAVENOUS | Status: DC
  Filled 2016-01-03: qty 4

## 2016-01-03 MED ORDER — CHLORHEXIDINE GLUCONATE 0.12 % MT SOLN
15.0000 mL | Freq: Once | OROMUCOSAL | Status: AC
Start: 2016-01-04 — End: 2016-01-04
  Administered 2016-01-04: 15 mL via OROMUCOSAL
  Filled 2016-01-03: qty 15

## 2016-01-03 MED ORDER — PHENYLEPHRINE HCL 10 MG/ML IJ SOLN
30.0000 ug/min | INTRAMUSCULAR | Status: DC
  Filled 2016-01-03: qty 2

## 2016-01-03 MED ORDER — DEXTROSE 5 % IV SOLN
1.5000 g | INTRAVENOUS | Status: AC
  Administered 2016-01-04: 750 mg via INTRAVENOUS
  Administered 2016-01-04: 1500 mg via INTRAVENOUS
  Filled 2016-01-03 (×3): qty 1.5

## 2016-01-03 MED ORDER — SODIUM CHLORIDE 0.9 % IV SOLN
INTRAVENOUS | Status: AC
  Administered 2016-01-04: 69.8 mL/h via INTRAVENOUS
  Filled 2016-01-03: qty 40

## 2016-01-03 MED ORDER — PANTOPRAZOLE SODIUM 40 MG PO TBEC: 40.0000 mg | DELAYED_RELEASE_TABLET | Freq: Every day | ORAL | Status: DC

## 2016-01-03 MED ORDER — VANCOMYCIN HCL 1000 MG IV SOLR
INTRAVENOUS | Status: AC
  Administered 2016-01-04: 1000 mL
  Filled 2016-01-03: qty 1000

## 2016-01-03 MED ORDER — SODIUM CHLORIDE 0.9 % IV SOLN
INTRAVENOUS | Status: AC
  Administered 2016-01-04: 1.1 [IU]/h via INTRAVENOUS
  Filled 2016-01-03: qty 2.5

## 2016-01-03 MED ORDER — METOPROLOL TARTRATE 12.5 MG HALF TABLET
12.5000 mg | ORAL_TABLET | Freq: Once | ORAL | Status: AC
  Administered 2016-01-04: 12.5 mg via ORAL
  Filled 2016-01-03: qty 1

## 2016-01-03 NOTE — Progress Notes (Signed)
      [address]       [place] [id number]             [phone]     CARDIOTHORACIC SURGERY PROGRESS NOTE  5 Days Post-Op  S/P Procedure(s) (LRB): Left Heart Cath and Coronary Angiography (N/A)  Subjective: Feels well except for some left shoulder pain that is clearly musculoskeletal and reproduced by passive motion of the shoulder joint and/or palpation of the [facility]  Objective: Vital signs in last 24 hours: Temp:  [97.6 F (36.4 C)-98.1 F (36.7 C)] 97.6 F (36.4 C) (01/10 1329) Pulse Rate:  [74-90] 90 (01/10 1329) Cardiac Rhythm:  [-] Normal sinus rhythm (01/10 0746) Resp:  [16-18] 18 (01/10 1329) BP: (102-112)/(58-82) 102/58 mmHg (01/10 1329) SpO2:  [97 %-100 %] 100 % (01/10 1329) Weight:  [105.416 kg (232 lb 6.4 oz)] 105.416 kg (232 lb 6.4 oz) (01/10 0500)  Physical Exam:  Rhythm:   sinus  Breath sounds: clear  Heart sounds:  RRR  Incisions:  n/a  Abdomen:  soft  Extremities:  warm   Intake/Output from previous day: 01/09 0701 - 01/10 0700 In: 833.4 [P.O.:700; I.V.:133.4] Out: -  Intake/Output this shift: Total I/O In: 180 [P.O.:180] Out: -   Lab Results:  Recent Labs  01/02/16 0813 01/03/16 0404  WBC 7.9 8.6  HGB 15.0 14.5  HCT 45.3 43.9  PLT 183 155   BMET:  Recent Labs  01/03/16 0404  NA 136  K 3.8  CL 101  CO2 25  GLUCOSE 99  BUN 20  CREATININE 1.39*  CALCIUM 9.0    CBG (last 3)  No results for input(s): GLUCAP in the last 72 hours. PT/INR:  No results for input(s): LABPROT, INR in the last 72 hours.  CXR:  CHEST 2 VIEW  COMPARISON: 12/21/2015 chest radiograph.  FINDINGS: Stable cardiomediastinal silhouette with normal heart size. No pneumothorax. No pleural effusion. Clear lungs, with no focal lung consolidation and no pulmonary edema.  IMPRESSION: No active cardiopulmonary disease.   Electronically Signed  By: Delbert PhenixJason A Poff M.D.  On: 12/31/2015 08:27  Assessment/Plan: S/P Procedure(s)  (LRB): Left Heart Cath and Coronary Angiography (N/A)  Clinically stable.  OR available for surgery tomorrow.  Will check platelet inhibition assay and plan to proceed with CABG tomorrow using LIMA and LRA for conduit.  I have again reviewed the indications, risks, and potential benefits of coronary artery bypass grafting with the patient.  Alternative treatment strategies have been discussed, including the relative risks, benefits and long term prognosis associated with medical therapy, percutaneous coronary intervention, and surgical revascularization.  The patient understands and accepts all potential associated risks of surgery including but not limited to risk of death, stroke or other neurologic complication, myocardial infarction, congestive heart failure, respiratory failure, renal failure, bleeding requiring blood transfusion and/or reexploration, aortic dissection or other major vascular complication, arrhythmia, heart block or bradycardia requiring permanent pacemaker, pneumonia, pleural effusion, wound infection, pulmonary embolus or other thromboembolic complication, chronic pain or other delayed complications related to median sternotomy, or the late recurrence of symptomatic ischemic heart disease and/or congestive heart failure.  The importance of long term risk modification have been emphasized.  All questions answered.  I spent in excess of 15 minutes during the conduct of this hospital encounter and >50% of this time involved direct face-to-face encounter with the patient for counseling and/or coordination of their care.    Purcell Nailslarence H Sherene Plancarte, MD 01/03/2016 3:20 PM

## 2016-01-03 NOTE — Anesthesia Preprocedure Evaluation (Addendum)
Anesthesia Evaluation  Patient identified by MRN, date of birth, ID band Patient awake    Reviewed: Allergy & Precautions, NPO status , Patient's Chart, lab work & pertinent test results, reviewed documented beta blocker date and time   Airway Mallampati: III  TM Distance: >3 FB Neck ROM: Full    Dental  (+) Dental Advisory Given   Pulmonary former smoker,    breath sounds clear to auscultation       Cardiovascular hypertension, Pt. on medications and Pt. on home beta blockers + angina at rest + CAD, + Past MI and + Cardiac Stents   Rhythm:Regular Rate:Normal     Neuro/Psych negative neurological ROS  negative psych ROS   GI/Hepatic GERD  Medicated,(+) Hepatitis -  Endo/Other  negative endocrine ROS  Renal/GU Renal InsufficiencyRenal disease  negative genitourinary   Musculoskeletal  (+) Arthritis , Osteoarthritis,    Abdominal   Peds  Hematology negative hematology ROS (+)   Anesthesia Other Findings   Reproductive/Obstetrics negative OB ROS                           Lab Results  Component Value Date   WBC 8.6 01/03/2016   HGB 14.5 01/03/2016   HCT 43.9 01/03/2016   MCV 83.5 01/03/2016   PLT 155 01/03/2016   Lab Results  Component Value Date   CREATININE 1.39* 01/03/2016   BUN 20 01/03/2016   NA 136 01/03/2016   K 3.8 01/03/2016   CL 101 01/03/2016   CO2 25 01/03/2016   Lab Results  Component Value Date   INR 1.24 01/03/2016   INR 1.06 12/20/2015   INR 1.12 07/19/2015   12/2015 EKG: normal sinus rhythm.  12/2015 LHC Dominance: Co-dominant   Left Anterior Descending   . Ost LAD to Prox LAD lesion, 75% stenosed.     Left Circumflex   . Ost Cx to Prox Cx lesion, 99% stenosed. The lesion was previously treated with a stent (unknown type).   . Prox Cx to Mid Cx lesion, 0% stenosed. Previously placed Prox Cx to Mid Cx stent (unknown type) is patent.       Anesthesia  Physical Anesthesia Plan  ASA: IV  Anesthesia Plan: General   Post-op Pain Management:    Induction: Intravenous  Airway Management Planned: Oral ETT  Additional Equipment: Arterial line, CVP, PA Cath, Ultrasound Guidance Line Placement and 3D TEE  Intra-op Plan:   Post-operative Plan: Post-operative intubation/ventilation  Informed Consent: I have reviewed the patients History and Physical, chart, labs and discussed the procedure including the risks, benefits and alternatives for the proposed anesthesia with the patient or authorized representative who has indicated his/her understanding and acceptance.   Dental advisory given  Plan Discussed with: CRNA  Anesthesia Plan Comments: (Arterial Line must be placed on the R side. )      Anesthesia Quick Evaluation

## 2016-01-03 NOTE — Progress Notes (Signed)
ANTICOAGULATION CONSULT NOTE - Follow Up Consult  Pharmacy Consult for Heparin Indication: CAD, awaiting CABG  No Known Allergies  Patient Measurements: Height: 6' (182.9 cm) Weight: 232 lb 6.4 oz (105.416 kg) IBW/kg (Calculated) : 77.6 Heparin Dosing Weight: 100 kg  Vital Signs: Temp: 97.6 F (36.4 C) (01/10 1329) Temp Source: Oral (01/10 1329) BP: 102/58 mmHg (01/10 1329) Pulse Rate: 90 (01/10 1329)  Labs:  Recent Labs  01/01/16 0540  01/02/16 0813  01/02/16 1300 01/02/16 2100 01/03/16 0404  HGB 14.0  --  15.0  --   --   --  14.5  HCT 43.2  --  45.3  --   --   --  43.9  PLT 179  --  183  --   --   --  155  HEPARINUNFRC 0.75*  < >  --   < > 0.85* 0.47 0.48  CREATININE  --   --   --   --   --   --  1.39*  < > = values in this interval not displayed.  Estimated Creatinine Clearance: 69.1 mL/min (by C-G formula based on Cr of 1.39).  Assessment:   Awaiting CABG on 01/05/16, after Effient washout.   Heparin is therapeutic (0.48) on 1000 units/hr.  Goal of Therapy:  Heparin level 0.3-0.7 units/ml Monitor platelets by anticoagulation protocol: Yes   Plan:   Continue heparin drip at 1000 units/hr.  Daily heparin level and CBC while on heparin.  Dennie FettersEgan, Wyeth Hoffer Donovan, RPh Pager: [phone] 01/03/2016,2:57 PM

## 2016-01-03 NOTE — Progress Notes (Signed)
Pt is refusing to watch open heart videos. RN informed pt to let her know if he changes his mind about watching it.  Sanda LingerMilam, Graison Leinberger R, RN

## 2016-01-03 NOTE — Progress Notes (Signed)
CARDIAC REHAB PHASE I   Pt lying in bed, states he just returned to bed from the recliner.  Pt states he has not walked today, declines ambulation at this time. States he will walk later independently. IS in pt room in bag on floor. Reviewed IS use with pt, pt states "I didn't think I was supposed to use that until after surgery." Pt demonstrated good use. Encouraged IS as tolerated. Pt in bed, call bell within reach. Will follow pre-op as schedule permits.     Joylene GrapesMonge, Eleesha Purkey C, RN, BSN 01/03/2016 2:09 PM

## 2016-01-03 NOTE — Progress Notes (Signed)
Subjective: No further chest pain but complains of acute pain in left shoulder. Hurts to move.   Objective: Vital signs in last 24 hours: Temp:  [97.7 F (36.5 C)-98.1 F (36.7 C)] 97.8 F (36.6 C) (01/10 0500) Pulse Rate:  [74-96] 75 (01/10 0500) Resp:  [16-20] 16 (01/10 0500) BP: (93-112)/(60-91) 112/82 mmHg (01/10 0500) SpO2:  [97 %-100 %] 98 % (01/10 0500) Weight:  [105.416 kg (232 lb 6.4 oz)] 105.416 kg (232 lb 6.4 oz) (01/10 0500) Weight change: -0.499 kg (-1 lb 1.6 oz) Last BM Date: 01/02/16 Intake/Output from previous day: -2957 01/09 0701 - 01/10 0700 In: 700 [P.O.:700] Out: -  Intake/Output this shift:    PE: General:Pleasant affect, NAD Skin:Warm and dry, brisk capillary refill HEENT:normocephalic, sclera clear, mucus membranes moist Neck:supple, no JVD, no bruits  Heart:S1S2 RRR without murmur, gallup, rub or click Lungs:clear without rales, rhonchi, or wheezes ZOX:WRUE, non tender, + BS, do not palpate liver spleen or masses Ext:no lower ext edema, 2+ pedal pulses, 2+ radial pulses. Severe pain to palpation of left AC joint.  Neuro:alert and oriented, MAE, follows commands, + facial symmetry  TELE:  SR   Lab Results:  Recent Labs  01/02/16 0813 01/03/16 0404  WBC 7.9 8.6  HGB 15.0 14.5  HCT 45.3 43.9  PLT 183 155   BMET  Recent Labs  01/03/16 0404  NA 136  K 3.8  CL 101  CO2 25  GLUCOSE 99  BUN 20  CREATININE 1.39*  CALCIUM 9.0   No results for input(s): TROPONINI in the last 72 hours.  Invalid input(s): CK, MB  Lab Results  Component Value Date   CHOL 114 12/30/2015   HDL 28* 12/30/2015   LDLCALC 69 12/30/2015   TRIG 86 12/30/2015   CHOLHDL 4.1 12/30/2015   Lab Results  Component Value Date   HGBA1C 5.9* 12/30/2015     Lab Results  Component Value Date   TSH 1.072 12/20/2015    Hepatic Function Panel No results for input(s): PROT, ALBUMIN, AST, ALT, ALKPHOS, BILITOT, BILIDIR, IBILI in the last 72 hours. No  results for input(s): CHOL in the last 72 hours. No results for input(s): PROTIME in the last 72 hours.     Studies/Results: Carotid dopplers: Bilateral: intimal wall thickening CCA. Mild soft plaque origin ICA. 1-39% ICA plaquing. Vertebral artery flow is antegrade   Medications: I have reviewed the patient's current medications. Scheduled Meds: . aspirin EC  81 mg Oral Daily  . atorvastatin  80 mg Oral q1800  . carvedilol  25 mg Oral BID WC  . ibuprofen  400 mg Oral QID  . isosorbide mononitrate  30 mg Oral Daily  . losartan  25 mg Oral Daily  . sodium chloride  3 mL Intravenous Q12H   Continuous Infusions: . heparin 1,000 Units/hr (01/03/16 0331)   PRN Meds:.sodium chloride, acetaminophen, ALPRAZolam, morphine injection, nitroGLYCERIN, ondansetron (ZOFRAN) IV, oxyCODONE-acetaminophen, pantoprazole, sodium chloride  Assessment/Plan: 1. Unstable angina. Critical ostial Lcx and 75% proximal LAD disease. No recurrent chest pain last night. On ASA, nitrates, beta blocker and statin. On IV heparin. Plan CABG on Thursday after Effient washout.  2. Acute left shoulder pain ? Bursitis. Will give percocet for pain. Start Ibuprofen for anti-inflammatory. On Protonix for GI prophylaxis.  3. Hyperlipidemia on statin. 4. HTN controlled.     Principal Problem:   Abnormal nuclear cardiac imaging test Active Problems:   Coronary artery disease due to lipid rich  plaque   CAD (coronary artery disease) unstable angina  On IV heparin will add imdur and prn xanax for anxiety.  Check EKG.    LOS: 5 days     Harrison Zetina [place], MDFACC 01/03/2016 9:46 AM

## 2016-01-04 ENCOUNTER — Inpatient Hospital Stay (HOSPITAL_COMMUNITY): Payer: 59 | Admitting: Certified Registered Nurse Anesthetist

## 2016-01-04 ENCOUNTER — Encounter (HOSPITAL_COMMUNITY): Payer: Self-pay | Admitting: Certified Registered Nurse Anesthetist

## 2016-01-04 ENCOUNTER — Inpatient Hospital Stay (HOSPITAL_COMMUNITY): Payer: 59

## 2016-01-04 ENCOUNTER — Encounter (HOSPITAL_COMMUNITY)
Admission: AD | Disposition: A | Discharge status: Home / Self Care | Payer: Managed Care, Other (non HMO) | Source: Ambulatory Visit | Attending: Cardiovascular Disease

## 2016-01-04 DIAGNOSIS — Z951 Presence of aortocoronary bypass graft: Secondary | ICD-10-CM

## 2016-01-04 HISTORY — PX: CORONARY ARTERY BYPASS GRAFT: SHX141

## 2016-01-04 HISTORY — PX: TEE WITHOUT CARDIOVERSION: SHX5443

## 2016-01-04 HISTORY — DX: Presence of aortocoronary bypass graft: Z95.1

## 2016-01-04 HISTORY — PX: RADIAL ARTERY HARVEST: SHX5067

## 2016-01-04 LAB — BASIC METABOLIC PANEL
ANION GAP: 10 (ref 5–15)
BUN: 22 mg/dL — ABNORMAL HIGH (ref 6–20)
CO2: 25 mmol/L (ref 22–32)
Calcium: 8.9 mg/dL (ref 8.9–10.3)
Chloride: 100 mmol/L — ABNORMAL LOW (ref 101–111)
Creatinine, Ser: 1.47 mg/dL — ABNORMAL HIGH (ref 0.61–1.24)
GFR, EST AFRICAN AMERICAN: 57 mL/min — AB (ref >60)
GFR, EST NON AFRICAN AMERICAN: 49 mL/min — AB (ref >60)
GLUCOSE: 94 mg/dL (ref 65–99)
POTASSIUM: 3.6 mmol/L (ref 3.5–5.1)
SODIUM: 135 mmol/L (ref 135–145)

## 2016-01-04 LAB — POCT I-STAT, CHEM 8
BUN: 23 mg/dL — AB (ref 6–20)
BUN: 20 mg/dL (ref 6–20)
BUN: 18 mg/dL (ref 6–20)
BUN: 17 mg/dL (ref 6–20)
BUN: 20 mg/dL (ref 6–20)
CALCIUM ION: 1.26 mmol/L (ref 1.13–1.30)
CHLORIDE: 100 mmol/L — AB (ref 101–111)
CHLORIDE: 98 mmol/L — AB (ref 101–111)
CHLORIDE: 99 mmol/L — AB (ref 101–111)
CREATININE: 1.2 mg/dL (ref 0.61–1.24)
CREATININE: 0.9 mg/dL (ref 0.61–1.24)
CREATININE: 1 mg/dL (ref 0.61–1.24)
Calcium, Ion: 1.18 mmol/L (ref 1.13–1.30)
Calcium, Ion: 1.19 mmol/L (ref 1.13–1.30)
Calcium, Ion: 1.02 mmol/L — ABNORMAL LOW (ref 1.13–1.30)
Calcium, Ion: 1.2 mmol/L (ref 1.13–1.30)
Chloride: 98 mmol/L — ABNORMAL LOW (ref 101–111)
Chloride: 102 mmol/L (ref 101–111)
Creatinine, Ser: 0.9 mg/dL (ref 0.61–1.24)
Creatinine, Ser: 1.1 mg/dL (ref 0.61–1.24)
GLUCOSE: 112 mg/dL — AB (ref 65–99)
GLUCOSE: 143 mg/dL — AB (ref 65–99)
Glucose, Bld: 115 mg/dL — ABNORMAL HIGH (ref 65–99)
Glucose, Bld: 116 mg/dL — ABNORMAL HIGH (ref 65–99)
Glucose, Bld: 128 mg/dL — ABNORMAL HIGH (ref 65–99)
HCT: 41 % (ref 39.0–52.0)
HCT: 30 % — ABNORMAL LOW (ref 39.0–52.0)
HCT: 29 % — ABNORMAL LOW (ref 39.0–52.0)
HEMATOCRIT: 37 % — AB (ref 39.0–52.0)
HEMATOCRIT: 34 % — AB (ref 39.0–52.0)
HEMOGLOBIN: 12.6 g/dL — AB (ref 13.0–17.0)
HEMOGLOBIN: 9.9 g/dL — AB (ref 13.0–17.0)
HEMOGLOBIN: 11.6 g/dL — AB (ref 13.0–17.0)
Hemoglobin: 13.9 g/dL (ref 13.0–17.0)
Hemoglobin: 10.2 g/dL — ABNORMAL LOW (ref 13.0–17.0)
POTASSIUM: 3.9 mmol/L (ref 3.5–5.1)
POTASSIUM: 4.9 mmol/L (ref 3.5–5.1)
POTASSIUM: 3.9 mmol/L (ref 3.5–5.1)
POTASSIUM: 4.9 mmol/L (ref 3.5–5.1)
Potassium: 3.9 mmol/L (ref 3.5–5.1)
SODIUM: 138 mmol/L (ref 135–145)
SODIUM: 138 mmol/L (ref 135–145)
Sodium: 137 mmol/L (ref 135–145)
Sodium: 137 mmol/L (ref 135–145)
Sodium: 135 mmol/L (ref 135–145)
TCO2: 28 mmol/L (ref 0–100)
TCO2: 30 mmol/L (ref 0–100)
TCO2: 29 mmol/L (ref 0–100)
TCO2: 25 mmol/L (ref 0–100)
TCO2: 26 mmol/L (ref 0–100)

## 2016-01-04 LAB — CBC
HCT: 40.8 % (ref 39.0–52.0)
HCT: 31.9 % — ABNORMAL LOW (ref 39.0–52.0)
HCT: 33.3 % — ABNORMAL LOW (ref 39.0–52.0)
HEMOGLOBIN: 10.7 g/dL — AB (ref 13.0–17.0)
HEMOGLOBIN: 10.8 g/dL — AB (ref 13.0–17.0)
Hemoglobin: 13.5 g/dL (ref 13.0–17.0)
MCH: 27.3 pg (ref 26.0–34.0)
MCH: 27.5 pg (ref 26.0–34.0)
MCH: 26.8 pg (ref 26.0–34.0)
MCHC: 33.1 g/dL (ref 30.0–36.0)
MCHC: 33.5 g/dL (ref 30.0–36.0)
MCHC: 32.4 g/dL (ref 30.0–36.0)
MCV: 82.6 fL (ref 78.0–100.0)
MCV: 82 fL (ref 78.0–100.0)
MCV: 82.6 fL (ref 78.0–100.0)
PLATELETS: 161 10*3/uL (ref 150–400)
PLATELETS: 124 10*3/uL — AB (ref 150–400)
Platelets: 102 10*3/uL — ABNORMAL LOW (ref 150–400)
RBC: 4.94 MIL/uL (ref 4.22–5.81)
RBC: 3.89 MIL/uL — ABNORMAL LOW (ref 4.22–5.81)
RBC: 4.03 MIL/uL — AB (ref 4.22–5.81)
RDW: 13.5 % (ref 11.5–15.5)
RDW: 13.2 % (ref 11.5–15.5)
RDW: 13.3 % (ref 11.5–15.5)
WBC: 8.9 10*3/uL (ref 4.0–10.5)
WBC: 11.4 10*3/uL — ABNORMAL HIGH (ref 4.0–10.5)
WBC: 14.7 10*3/uL — AB (ref 4.0–10.5)

## 2016-01-04 LAB — POCT I-STAT 3, ART BLOOD GAS (G3+)
ACID-BASE DEFICIT: 1 mmol/L (ref 0.0–2.0)
Acid-Base Excess: 5 mmol/L — ABNORMAL HIGH (ref 0.0–2.0)
Acid-base deficit: 2 mmol/L (ref 0.0–2.0)
BICARBONATE: 29 meq/L — AB (ref 20.0–24.0)
BICARBONATE: 24.4 meq/L — AB (ref 20.0–24.0)
BICARBONATE: 24.9 meq/L — AB (ref 20.0–24.0)
BICARBONATE: 25.4 meq/L — AB (ref 20.0–24.0)
O2 SAT: 100 %
O2 Saturation: 100 %
O2 Saturation: 97 %
O2 Saturation: 97 %
PCO2 ART: 49.9 mmHg — AB (ref 35.0–45.0)
PH ART: 7.465 — AB (ref 7.350–7.450)
PH ART: 7.307 — AB (ref 7.350–7.450)
PH ART: 7.316 — AB (ref 7.350–7.450)
TCO2: 30 mmol/L (ref 0–100)
TCO2: 26 mmol/L (ref 0–100)
TCO2: 26 mmol/L (ref 0–100)
TCO2: 27 mmol/L (ref 0–100)
pCO2 arterial: 40.4 mmHg (ref 35.0–45.0)
pCO2 arterial: 39.6 mmHg (ref 35.0–45.0)
pCO2 arterial: 49.7 mmHg — ABNORMAL HIGH (ref 35.0–45.0)
pH, Arterial: 7.398 (ref 7.350–7.450)
pO2, Arterial: 458 mmHg — ABNORMAL HIGH (ref 80.0–100.0)
pO2, Arterial: 244 mmHg — ABNORMAL HIGH (ref 80.0–100.0)
pO2, Arterial: 95 mmHg (ref 80.0–100.0)
pO2, Arterial: 106 mmHg — ABNORMAL HIGH (ref 80.0–100.0)

## 2016-01-04 LAB — PROTIME-INR
INR: 1.46 (ref 0.00–1.49)
Prothrombin Time: 17.8 seconds — ABNORMAL HIGH (ref 11.6–15.2)

## 2016-01-04 LAB — POCT I-STAT 3, VENOUS BLOOD GAS (G3P V)
Acid-base deficit: 1 mmol/L (ref 0.0–2.0)
BICARBONATE: 24 meq/L (ref 20.0–24.0)
O2 SAT: 98 %
PCO2 VEN: 36.8 mmHg — AB (ref 45.0–50.0)
PH VEN: 7.418 — AB (ref 7.250–7.300)
TCO2: 25 mmol/L (ref 0–100)
pO2, Ven: 90 mmHg — ABNORMAL HIGH (ref 30.0–45.0)

## 2016-01-04 LAB — POCT I-STAT 4, (NA,K, GLUC, HGB,HCT)
GLUCOSE: 121 mg/dL — AB (ref 65–99)
HEMATOCRIT: 33 % — AB (ref 39.0–52.0)
Hemoglobin: 11.2 g/dL — ABNORMAL LOW (ref 13.0–17.0)
Potassium: 3.8 mmol/L (ref 3.5–5.1)
Sodium: 138 mmol/L (ref 135–145)

## 2016-01-04 LAB — CREATININE, SERUM: CREATININE: 1.13 mg/dL (ref 0.61–1.24)

## 2016-01-04 LAB — APTT: APTT: 30 s (ref 24–37)

## 2016-01-04 LAB — HEMOGLOBIN AND HEMATOCRIT, BLOOD
HCT: 29.1 % — ABNORMAL LOW (ref 39.0–52.0)
Hemoglobin: 9.8 g/dL — ABNORMAL LOW (ref 13.0–17.0)

## 2016-01-04 LAB — PLATELET COUNT: Platelets: 145 10*3/uL — ABNORMAL LOW (ref 150–400)

## 2016-01-04 LAB — MAGNESIUM: MAGNESIUM: 2.8 mg/dL — AB (ref 1.7–2.4)

## 2016-01-04 LAB — HEPARIN LEVEL (UNFRACTIONATED)

## 2016-01-04 SURGERY — CORONARY ARTERY BYPASS GRAFTING (CABG)
Anesthesia: General | Site: Chest

## 2016-01-04 MED ORDER — SODIUM CHLORIDE 0.9 % IV SOLN
INTRAVENOUS | Status: DC | PRN
  Administered 2016-01-04: 13:00:00 via INTRAVENOUS

## 2016-01-04 MED ORDER — LACTATED RINGERS IV SOLN
INTRAVENOUS | Status: DC | PRN
  Administered 2016-01-04: 08:00:00 via INTRAVENOUS

## 2016-01-04 MED ORDER — PROTAMINE SULFATE 10 MG/ML IV SOLN
INTRAVENOUS | Status: AC
  Filled 2016-01-04: qty 10

## 2016-01-04 MED ORDER — OXYCODONE HCL 5 MG PO TABS
5.0000 mg | ORAL_TABLET | ORAL | Status: DC | PRN
  Administered 2016-01-05: 5 mg via ORAL
  Administered 2016-01-05 – 2016-01-06 (×3): 10 mg via ORAL
  Filled 2016-01-04 (×3): qty 1
  Filled 2016-01-04 (×2): qty 2

## 2016-01-04 MED ORDER — FAMOTIDINE IN NACL 20-0.9 MG/50ML-% IV SOLN
20.0000 mg | Freq: Two times a day (BID) | INTRAVENOUS | Status: AC
  Administered 2016-01-04: 20 mg via INTRAVENOUS

## 2016-01-04 MED ORDER — PROPOFOL 10 MG/ML IV BOLUS
INTRAVENOUS | Status: AC
  Filled 2016-01-04: qty 20

## 2016-01-04 MED ORDER — MIDAZOLAM HCL 2 MG/2ML IJ SOLN: 2.0000 mg | INTRAMUSCULAR | Status: DC | PRN

## 2016-01-04 MED ORDER — DEXTROSE 50 % IV SOLN
17.0000 mL | Freq: Once | INTRAVENOUS | Status: AC
  Administered 2016-01-04: 17 mL via INTRAVENOUS

## 2016-01-04 MED ORDER — MIDAZOLAM HCL 2 MG/2ML IJ SOLN
INTRAMUSCULAR | Status: AC
  Filled 2016-01-04: qty 2

## 2016-01-04 MED ORDER — CETYLPYRIDINIUM CHLORIDE 0.05 % MT LIQD
7.0000 mL | Freq: Two times a day (BID) | OROMUCOSAL | Status: DC
  Administered 2016-01-04 – 2016-01-05 (×3): 7 mL via OROMUCOSAL

## 2016-01-04 MED ORDER — PHENYLEPHRINE HCL 10 MG/ML IJ SOLN
10.0000 mg | INTRAVENOUS | Status: DC | PRN
  Administered 2016-01-04: 10 ug/min via INTRAVENOUS

## 2016-01-04 MED ORDER — METOPROLOL TARTRATE 12.5 MG HALF TABLET
12.5000 mg | ORAL_TABLET | Freq: Two times a day (BID) | ORAL | Status: DC
  Administered 2016-01-05 (×2): 12.5 mg via ORAL
  Filled 2016-01-04 (×3): qty 1

## 2016-01-04 MED ORDER — INSULIN ASPART 100 UNIT/ML ~~LOC~~ SOLN
0.0000 [IU] | SUBCUTANEOUS | Status: DC
  Administered 2016-01-04 – 2016-01-06 (×3): 2 [IU] via SUBCUTANEOUS

## 2016-01-04 MED ORDER — SUCCINYLCHOLINE CHLORIDE 20 MG/ML IJ SOLN
INTRAMUSCULAR | Status: AC
  Filled 2016-01-04: qty 1

## 2016-01-04 MED ORDER — ROCURONIUM BROMIDE 100 MG/10ML IV SOLN
INTRAVENOUS | Status: DC | PRN
  Administered 2016-01-04 (×2): 50 mg via INTRAVENOUS
  Administered 2016-01-04: 40 mg via INTRAVENOUS
  Administered 2016-01-04: 50 mg via INTRAVENOUS
  Administered 2016-01-04: 60 mg via INTRAVENOUS

## 2016-01-04 MED ORDER — MORPHINE SULFATE (PF) 2 MG/ML IV SOLN
1.0000 mg | INTRAVENOUS | Status: AC | PRN
  Administered 2016-01-04: 4 mg via INTRAVENOUS
  Administered 2016-01-04 (×2): 2 mg via INTRAVENOUS

## 2016-01-04 MED ORDER — METOPROLOL TARTRATE 1 MG/ML IV SOLN: 2.5000 mg | INTRAVENOUS | Status: DC | PRN

## 2016-01-04 MED ORDER — POTASSIUM CHLORIDE 10 MEQ/50ML IV SOLN
10.0000 meq | INTRAVENOUS | Status: AC
  Administered 2016-01-04 (×3): 10 meq via INTRAVENOUS

## 2016-01-04 MED ORDER — ASPIRIN 81 MG PO CHEW: 324.0000 mg | CHEWABLE_TABLET | Freq: Every day | ORAL | Status: DC

## 2016-01-04 MED ORDER — ACETAMINOPHEN 650 MG RE SUPP
650.0000 mg | Freq: Once | RECTAL | Status: AC
  Administered 2016-01-04: 650 mg via RECTAL

## 2016-01-04 MED ORDER — ALBUMIN HUMAN 5 % IV SOLN
250.0000 mL | INTRAVENOUS | Status: AC | PRN
  Administered 2016-01-04 (×3): 250 mL via INTRAVENOUS

## 2016-01-04 MED ORDER — SODIUM CHLORIDE 0.9 % IV SOLN: 250.0000 mL | INTRAVENOUS | Status: DC

## 2016-01-04 MED ORDER — ACETAMINOPHEN 160 MG/5ML PO SOLN: 650.0000 mg | Freq: Once | ORAL | Status: AC

## 2016-01-04 MED ORDER — LACTATED RINGERS IV SOLN: INTRAVENOUS | Status: DC

## 2016-01-04 MED ORDER — ARTIFICIAL TEARS OP OINT
TOPICAL_OINTMENT | OPHTHALMIC | Status: DC | PRN
  Administered 2016-01-04: 1 via OPHTHALMIC

## 2016-01-04 MED ORDER — ARTIFICIAL TEARS OP OINT
TOPICAL_OINTMENT | OPHTHALMIC | Status: AC
  Filled 2016-01-04: qty 3.5

## 2016-01-04 MED ORDER — BISACODYL 5 MG PO TBEC
10.0000 mg | DELAYED_RELEASE_TABLET | Freq: Every day | ORAL | Status: DC
  Administered 2016-01-05 – 2016-01-09 (×4): 10 mg via ORAL
  Filled 2016-01-04 (×5): qty 2

## 2016-01-04 MED ORDER — DOCUSATE SODIUM 100 MG PO CAPS
200.0000 mg | ORAL_CAPSULE | Freq: Every day | ORAL | Status: DC
  Administered 2016-01-05 – 2016-01-09 (×4): 200 mg via ORAL
  Filled 2016-01-04 (×5): qty 2

## 2016-01-04 MED ORDER — SODIUM CHLORIDE 0.9 % IV SOLN
INTRAVENOUS | Status: DC
  Filled 2016-01-04 (×2): qty 2.5

## 2016-01-04 MED ORDER — PROTAMINE SULFATE 10 MG/ML IV SOLN
INTRAVENOUS | Status: AC
  Filled 2016-01-04: qty 25

## 2016-01-04 MED ORDER — VANCOMYCIN HCL IN DEXTROSE 1-5 GM/200ML-% IV SOLN
1000.0000 mg | Freq: Once | INTRAVENOUS | Status: AC
  Administered 2016-01-04: 1000 mg via INTRAVENOUS
  Filled 2016-01-04: qty 200

## 2016-01-04 MED ORDER — DEXTROSE 50 % IV SOLN
INTRAVENOUS | Status: AC
  Filled 2016-01-04: qty 50

## 2016-01-04 MED ORDER — LACTATED RINGERS IV SOLN: 500.0000 mL | Freq: Once | INTRAVENOUS | Status: DC | PRN

## 2016-01-04 MED ORDER — HEPARIN SODIUM (PORCINE) 1000 UNIT/ML IJ SOLN
INTRAMUSCULAR | Status: DC | PRN
Start: 2016-01-04 — End: 2016-01-04
  Administered 2016-01-04: 5000 [IU] via INTRAVENOUS
  Administered 2016-01-04: 35000 [IU] via INTRAVENOUS

## 2016-01-04 MED ORDER — SODIUM CHLORIDE 0.9 % IJ SOLN
OROMUCOSAL | Status: DC | PRN
  Administered 2016-01-04 (×3): 4 mL via TOPICAL

## 2016-01-04 MED ORDER — CALCIUM CHLORIDE 10 % IV SOLN
INTRAVENOUS | Status: AC
  Filled 2016-01-04: qty 10

## 2016-01-04 MED ORDER — SODIUM CHLORIDE 0.9 % IJ SOLN: 3.0000 mL | INTRAMUSCULAR | Status: DC | PRN

## 2016-01-04 MED ORDER — HEPARIN SODIUM (PORCINE) 1000 UNIT/ML IJ SOLN
INTRAMUSCULAR | Status: AC
  Filled 2016-01-04: qty 1

## 2016-01-04 MED ORDER — DEXTROSE 5 % IV SOLN
1.5000 g | Freq: Two times a day (BID) | INTRAVENOUS | Status: AC
  Administered 2016-01-04 – 2016-01-06 (×4): 1.5 g via INTRAVENOUS
  Filled 2016-01-04 (×4): qty 1.5

## 2016-01-04 MED ORDER — ROCURONIUM BROMIDE 50 MG/5ML IV SOLN
INTRAVENOUS | Status: AC
  Filled 2016-01-04: qty 1

## 2016-01-04 MED ORDER — 0.9 % SODIUM CHLORIDE (POUR BTL) OPTIME
TOPICAL | Status: DC | PRN
  Administered 2016-01-04: 5000 mL

## 2016-01-04 MED ORDER — PHENYLEPHRINE HCL 10 MG/ML IJ SOLN
0.0000 ug/min | INTRAVENOUS | Status: DC
  Filled 2016-01-04: qty 2

## 2016-01-04 MED ORDER — FENTANYL CITRATE (PF) 250 MCG/5ML IJ SOLN
INTRAMUSCULAR | Status: AC
  Filled 2016-01-04: qty 25

## 2016-01-04 MED ORDER — DEXMEDETOMIDINE HCL IN NACL 200 MCG/50ML IV SOLN: 0.0000 ug/kg/h | INTRAVENOUS | Status: DC

## 2016-01-04 MED ORDER — LACTATED RINGERS IV SOLN
INTRAVENOUS | Status: DC | PRN
  Administered 2016-01-04 (×2): via INTRAVENOUS

## 2016-01-04 MED ORDER — ANTISEPTIC ORAL RINSE SOLUTION (CORINZ): 7.0000 mL | Freq: Four times a day (QID) | OROMUCOSAL | Status: DC

## 2016-01-04 MED ORDER — DEXTROSE 5 % IV SOLN
20.0000 mg | INTRAVENOUS | Status: DC | PRN
  Administered 2016-01-04: 20 ug/min via INTRAVENOUS

## 2016-01-04 MED ORDER — EPHEDRINE SULFATE 50 MG/ML IJ SOLN
INTRAMUSCULAR | Status: AC
  Filled 2016-01-04: qty 1

## 2016-01-04 MED ORDER — INSULIN REGULAR BOLUS VIA INFUSION
0.0000 [IU] | Freq: Three times a day (TID) | INTRAVENOUS | Status: DC
  Filled 2016-01-04: qty 10

## 2016-01-04 MED ORDER — ROCURONIUM BROMIDE 50 MG/5ML IV SOLN
INTRAVENOUS | Status: AC
  Filled 2016-01-04: qty 4

## 2016-01-04 MED ORDER — MIDAZOLAM HCL 10 MG/2ML IJ SOLN
INTRAMUSCULAR | Status: AC
  Filled 2016-01-04: qty 2

## 2016-01-04 MED ORDER — CHLORHEXIDINE GLUCONATE 0.12% ORAL RINSE (MEDLINE KIT): 15.0000 mL | Freq: Two times a day (BID) | OROMUCOSAL | Status: DC

## 2016-01-04 MED ORDER — MUPIROCIN 2 % EX OINT
1.0000 "application " | TOPICAL_OINTMENT | Freq: Two times a day (BID) | CUTANEOUS | Status: DC
  Administered 2016-01-04: 1 via NASAL
  Filled 2016-01-04: qty 22

## 2016-01-04 MED ORDER — PANTOPRAZOLE SODIUM 40 MG PO TBEC
40.0000 mg | DELAYED_RELEASE_TABLET | Freq: Every day | ORAL | Status: DC
  Administered 2016-01-05: 40 mg via ORAL
  Filled 2016-01-04 (×2): qty 1

## 2016-01-04 MED ORDER — PHENYLEPHRINE 40 MCG/ML (10ML) SYRINGE FOR IV PUSH (FOR BLOOD PRESSURE SUPPORT)
PREFILLED_SYRINGE | INTRAVENOUS | Status: AC
  Filled 2016-01-04: qty 10

## 2016-01-04 MED ORDER — SODIUM CHLORIDE 0.45 % IV SOLN: INTRAVENOUS | Status: DC | PRN

## 2016-01-04 MED ORDER — ACETAMINOPHEN 500 MG PO TABS
1000.0000 mg | ORAL_TABLET | Freq: Four times a day (QID) | ORAL | Status: DC
Start: 2016-01-05 — End: 2016-01-09
  Administered 2016-01-04 – 2016-01-09 (×16): 1000 mg via ORAL
  Filled 2016-01-04 (×18): qty 2

## 2016-01-04 MED ORDER — ASPIRIN EC 325 MG PO TBEC
325.0000 mg | DELAYED_RELEASE_TABLET | Freq: Every day | ORAL | Status: DC
  Administered 2016-01-05: 325 mg via ORAL
  Filled 2016-01-04: qty 1

## 2016-01-04 MED ORDER — ONDANSETRON HCL 4 MG/2ML IJ SOLN
4.0000 mg | Freq: Four times a day (QID) | INTRAMUSCULAR | Status: DC | PRN
  Administered 2016-01-04 – 2016-01-05 (×4): 4 mg via INTRAVENOUS
  Filled 2016-01-04 (×4): qty 2

## 2016-01-04 MED ORDER — CHLORHEXIDINE GLUCONATE CLOTH 2 % EX PADS: 6.0000 | MEDICATED_PAD | Freq: Every day | CUTANEOUS | Status: DC

## 2016-01-04 MED ORDER — SODIUM CHLORIDE 0.9 % IV SOLN: INTRAVENOUS | Status: DC

## 2016-01-04 MED ORDER — METOPROLOL TARTRATE 25 MG/10 ML ORAL SUSPENSION: 12.5000 mg | Freq: Two times a day (BID) | ORAL | Status: DC

## 2016-01-04 MED ORDER — MUPIROCIN 2 % EX OINT
TOPICAL_OINTMENT | Freq: Two times a day (BID) | CUTANEOUS | Status: DC
  Administered 2016-01-04 – 2016-01-05 (×2): via NASAL
  Administered 2016-01-05: 1 via NASAL
  Administered 2016-01-06 – 2016-01-07 (×4): via NASAL
  Administered 2016-01-08: 1 via NASAL
  Administered 2016-01-09: 10:00:00 via NASAL
  Filled 2016-01-04 (×2): qty 22

## 2016-01-04 MED ORDER — LIDOCAINE HCL (CARDIAC) 20 MG/ML IV SOLN
INTRAVENOUS | Status: DC | PRN
  Administered 2016-01-04: 50 mg via INTRAVENOUS

## 2016-01-04 MED ORDER — NITROGLYCERIN IN D5W 200-5 MCG/ML-% IV SOLN: 0.0000 ug/min | INTRAVENOUS | Status: DC

## 2016-01-04 MED ORDER — PROPOFOL 10 MG/ML IV BOLUS
INTRAVENOUS | Status: DC | PRN
  Administered 2016-01-04: 140 mg via INTRAVENOUS

## 2016-01-04 MED ORDER — CALCIUM CHLORIDE 10 % IV SOLN
INTRAVENOUS | Status: DC | PRN
  Administered 2016-01-04 (×2): 200 mg via INTRAVENOUS

## 2016-01-04 MED ORDER — NITROGLYCERIN IN D5W 200-5 MCG/ML-% IV SOLN: 7.0000 ug/min | INTRAVENOUS | Status: DC

## 2016-01-04 MED ORDER — FENTANYL CITRATE (PF) 100 MCG/2ML IJ SOLN
INTRAMUSCULAR | Status: DC | PRN
  Administered 2016-01-04 (×2): 50 ug via INTRAVENOUS
  Administered 2016-01-04 (×2): 100 ug via INTRAVENOUS
  Administered 2016-01-04 (×2): 150 ug via INTRAVENOUS
  Administered 2016-01-04: 50 ug via INTRAVENOUS
  Administered 2016-01-04 (×2): 100 ug via INTRAVENOUS
  Administered 2016-01-04 (×2): 150 ug via INTRAVENOUS
  Administered 2016-01-04 (×2): 50 ug via INTRAVENOUS

## 2016-01-04 MED ORDER — TRAMADOL HCL 50 MG PO TABS
50.0000 mg | ORAL_TABLET | ORAL | Status: DC | PRN
  Administered 2016-01-05: 100 mg via ORAL
  Filled 2016-01-04: qty 2

## 2016-01-04 MED ORDER — MORPHINE SULFATE (PF) 2 MG/ML IV SOLN
2.0000 mg | INTRAVENOUS | Status: DC | PRN
  Filled 2016-01-04: qty 2
  Filled 2016-01-04 (×2): qty 1

## 2016-01-04 MED ORDER — MAGNESIUM SULFATE 4 GM/100ML IV SOLN
4.0000 g | Freq: Once | INTRAVENOUS | Status: AC
  Administered 2016-01-04: 4 g via INTRAVENOUS
  Filled 2016-01-04: qty 100

## 2016-01-04 MED ORDER — ACETAMINOPHEN 160 MG/5ML PO SOLN: 1000.0000 mg | Freq: Four times a day (QID) | ORAL | Status: DC

## 2016-01-04 MED ORDER — ALBUMIN HUMAN 5 % IV SOLN
INTRAVENOUS | Status: DC | PRN
  Administered 2016-01-04: 13:00:00 via INTRAVENOUS

## 2016-01-04 MED ORDER — LACTATED RINGERS IV SOLN
INTRAVENOUS | Status: DC | PRN
  Administered 2016-01-04 (×2): via INTRAVENOUS

## 2016-01-04 MED ORDER — SODIUM CHLORIDE 0.9 % IJ SOLN
3.0000 mL | Freq: Two times a day (BID) | INTRAMUSCULAR | Status: DC
  Administered 2016-01-08: 3 mL via INTRAVENOUS

## 2016-01-04 MED ORDER — SODIUM CHLORIDE 0.9 % IV SOLN: INTRAVENOUS | Status: AC

## 2016-01-04 MED ORDER — MIDAZOLAM HCL 5 MG/5ML IJ SOLN
INTRAMUSCULAR | Status: DC | PRN
  Administered 2016-01-04 (×4): 1 mg via INTRAVENOUS
  Administered 2016-01-04: 4 mg via INTRAVENOUS
  Administered 2016-01-04 (×2): 2 mg via INTRAVENOUS

## 2016-01-04 MED ORDER — PROTAMINE SULFATE 10 MG/ML IV SOLN
INTRAVENOUS | Status: DC | PRN
  Administered 2016-01-04 (×2): 20 mg via INTRAVENOUS
  Administered 2016-01-04 (×3): 30 mg via INTRAVENOUS
  Administered 2016-01-04: 20 mg via INTRAVENOUS
  Administered 2016-01-04: 30 mg via INTRAVENOUS
  Administered 2016-01-04: 20 mg via INTRAVENOUS
  Administered 2016-01-04 (×5): 30 mg via INTRAVENOUS

## 2016-01-04 MED ORDER — BISACODYL 10 MG RE SUPP: 10.0000 mg | Freq: Every day | RECTAL | Status: DC

## 2016-01-04 MED ORDER — LIDOCAINE HCL (CARDIAC) 20 MG/ML IV SOLN
INTRAVENOUS | Status: AC
  Filled 2016-01-04: qty 5

## 2016-01-04 MED ORDER — CHLORHEXIDINE GLUCONATE 0.12 % MT SOLN
15.0000 mL | OROMUCOSAL | Status: AC
  Administered 2016-01-04: 15 mL via OROMUCOSAL

## 2016-01-04 MED FILL — Magnesium Sulfate Inj 50%: INTRAMUSCULAR | Qty: 10 | Status: AC

## 2016-01-04 MED FILL — Potassium Chloride Inj 2 mEq/ML: INTRAVENOUS | Qty: 40 | Status: AC

## 2016-01-04 MED FILL — Heparin Sodium (Porcine) Inj 1000 Unit/ML: INTRAMUSCULAR | Qty: 30 | Status: AC

## 2016-01-04 SURGICAL SUPPLY — 117 items
APPLIER CLIP 9.375 SM OPEN (CLIP)
APR CLP SM 9.3 20 MLT OPN (CLIP)
BAG DECANTER FOR FLEXI CONT (MISCELLANEOUS) ×8 IMPLANT
BANDAGE ACE 4X5 VEL STRL LF (GAUZE/BANDAGES/DRESSINGS) ×4 IMPLANT
BANDAGE ELASTIC 4 VELCRO ST LF (GAUZE/BANDAGES/DRESSINGS) ×4 IMPLANT
BANDAGE ELASTIC 6 VELCRO ST LF (GAUZE/BANDAGES/DRESSINGS) ×2 IMPLANT
BASKET HEART (ORDER IN 25'S) (MISCELLANEOUS) ×1
BASKET HEART (ORDER IN 25S) (MISCELLANEOUS) ×3 IMPLANT
BLADE STERNUM SYSTEM 6 (BLADE) ×4 IMPLANT
BLADE SURG 15 STRL LF DISP TIS (BLADE) IMPLANT
BLADE SURG 15 STRL SS (BLADE)
BLADE SURG ROTATE 9660 (MISCELLANEOUS) ×3 IMPLANT
BNDG GAUZE ELAST 4 BULKY (GAUZE/BANDAGES/DRESSINGS) ×4 IMPLANT
CANISTER SUCTION 2500CC (MISCELLANEOUS) ×4 IMPLANT
CANNULA EZ GLIDE AORTIC 21FR (CANNULA) ×8 IMPLANT
CATH CPB KIT OWEN (MISCELLANEOUS) ×4 IMPLANT
CATH THORACIC 36FR (CATHETERS) ×4 IMPLANT
CLIP APPLIE 9.375 SM OPEN (CLIP) IMPLANT
CLIP RETRACTION 3.0MM CORONARY (MISCELLANEOUS) ×2 IMPLANT
CLIP TI MEDIUM 24 (CLIP) IMPLANT
CLIP TI WIDE RED SMALL 24 (CLIP) IMPLANT
CONN ST 1/4X3/8  BEN (MISCELLANEOUS) ×3
CONN ST 1/4X3/8 BEN (MISCELLANEOUS) ×3 IMPLANT
COVER MAYO STAND STRL (DRAPES) IMPLANT
CRADLE DONUT ADULT HEAD (MISCELLANEOUS) ×4 IMPLANT
DRAIN CHANNEL 32F RND 10.7 FF (WOUND CARE) ×8 IMPLANT
DRAPE CARDIOVASCULAR INCISE (DRAPES) ×4
DRAPE EXTREMITY T 121X128X90 (DRAPE) IMPLANT
DRAPE INCISE IOBAN 66X45 STRL (DRAPES) ×4 IMPLANT
DRAPE PROXIMA HALF (DRAPES) ×3 IMPLANT
DRAPE SLUSH/WARMER DISC (DRAPES) ×4 IMPLANT
DRAPE SRG 135X102X78XABS (DRAPES) ×3 IMPLANT
DRSG COVADERM 4X14 (GAUZE/BANDAGES/DRESSINGS) ×4 IMPLANT
ELECT BLADE 4.0 EZ CLEAN MEGAD (MISCELLANEOUS) ×4
ELECT REM PT RETURN 9FT ADLT (ELECTROSURGICAL) ×8
ELECTRODE BLDE 4.0 EZ CLN MEGD (MISCELLANEOUS) IMPLANT
ELECTRODE REM PT RTRN 9FT ADLT (ELECTROSURGICAL) ×6 IMPLANT
GAUZE SPONGE 4X4 12PLY STRL (GAUZE/BANDAGES/DRESSINGS) ×8 IMPLANT
GEL ULTRASOUND 20GR AQUASONIC (MISCELLANEOUS) IMPLANT
GLOVE BIO SURGEON STRL SZ 6 (GLOVE) ×4 IMPLANT
GLOVE BIO SURGEON STRL SZ 6.5 (GLOVE) ×12 IMPLANT
GLOVE BIO SURGEON STRL SZ7 (GLOVE) ×2 IMPLANT
GLOVE BIOGEL PI IND STRL 6.5 (GLOVE) ×8 IMPLANT
GLOVE BIOGEL PI IND STRL 8 (GLOVE) IMPLANT
GLOVE BIOGEL PI INDICATOR 6.5 (GLOVE) ×4
GLOVE BIOGEL PI INDICATOR 8 (GLOVE) ×1
GLOVE ORTHO TXT STRL SZ7.5 (GLOVE) ×8 IMPLANT
GOWN STRL REUS W/ TWL LRG LVL3 (GOWN DISPOSABLE) ×12 IMPLANT
GOWN STRL REUS W/TWL LRG LVL3 (GOWN DISPOSABLE) ×16
HARMONIC SHEARS 14CM COAG (MISCELLANEOUS) ×4 IMPLANT
HEMOSTAT POWDER SURGIFOAM 1G (HEMOSTASIS) ×12 IMPLANT
INSERT FOGARTY XLG (MISCELLANEOUS) ×4 IMPLANT
KIT BASIN OR (CUSTOM PROCEDURE TRAY) ×4 IMPLANT
KIT ROOM TURNOVER OR (KITS) ×4 IMPLANT
KIT SUCTION CATH 14FR (SUCTIONS) ×13 IMPLANT
KIT VASOVIEW W/TROCAR VH 2000 (KITS) ×4 IMPLANT
LEAD PACING MYOCARDI (MISCELLANEOUS) ×4 IMPLANT
MARKER GRAFT CORONARY BYPASS (MISCELLANEOUS) ×6 IMPLANT
NS IRRIG 1000ML POUR BTL (IV SOLUTION) ×20 IMPLANT
PACK OPEN HEART (CUSTOM PROCEDURE TRAY) ×4 IMPLANT
PAD ARMBOARD 7.5X6 YLW CONV (MISCELLANEOUS) ×8 IMPLANT
PAD ELECT DEFIB RADIOL ZOLL (MISCELLANEOUS) ×4 IMPLANT
PENCIL BUTTON HOLSTER BLD 10FT (ELECTRODE) ×4 IMPLANT
PUNCH AORTIC ROTATE 4.0MM (MISCELLANEOUS) IMPLANT
PUNCH AORTIC ROTATE 4.5MM 8IN (MISCELLANEOUS) ×1 IMPLANT
PUNCH AORTIC ROTATE 5MM 8IN (MISCELLANEOUS) IMPLANT
SET CARDIOPLEGIA MPS 5001102 (MISCELLANEOUS) ×1 IMPLANT
SOLUTION ANTI FOG 6CC (MISCELLANEOUS) IMPLANT
SPONGE GAUZE 4X4 12PLY STER LF (GAUZE/BANDAGES/DRESSINGS) ×2 IMPLANT
SPONGE LAP 18X18 X RAY DECT (DISPOSABLE) ×4 IMPLANT
SPONGE LAP 4X18 X RAY DECT (DISPOSABLE) ×1 IMPLANT
SUT BONE WAX W31G (SUTURE) ×4 IMPLANT
SUT ETHIBOND 2 0 SH (SUTURE) ×16
SUT ETHIBOND 2 0 SH 36X2 (SUTURE) IMPLANT
SUT ETHIBOND X763 2 0 SH 1 (SUTURE) ×8 IMPLANT
SUT MNCRL AB 3-0 PS2 18 (SUTURE) ×8 IMPLANT
SUT MNCRL AB 4-0 PS2 18 (SUTURE) IMPLANT
SUT PDS AB 1 CTX 36 (SUTURE) ×8 IMPLANT
SUT PROLENE 2 0 SH DA (SUTURE) IMPLANT
SUT PROLENE 3 0 SH DA (SUTURE) ×4 IMPLANT
SUT PROLENE 3 0 SH1 36 (SUTURE) IMPLANT
SUT PROLENE 4 0 RB 1 (SUTURE) ×12
SUT PROLENE 4 0 SH DA (SUTURE) ×1 IMPLANT
SUT PROLENE 4-0 RB1 .5 CRCL 36 (SUTURE) IMPLANT
SUT PROLENE 5 0 C 1 36 (SUTURE) ×6 IMPLANT
SUT PROLENE 6 0 C 1 30 (SUTURE) ×4 IMPLANT
SUT PROLENE 7.0 RB 3 (SUTURE) ×13 IMPLANT
SUT PROLENE 8 0 BV175 6 (SUTURE) ×15 IMPLANT
SUT PROLENE BLUE 7 0 (SUTURE) ×4 IMPLANT
SUT PROLENE POLY MONO (SUTURE) IMPLANT
SUT SILK  1 MH (SUTURE) ×3
SUT SILK 1 MH (SUTURE) ×7 IMPLANT
SUT STEEL 6MS V (SUTURE) ×2 IMPLANT
SUT STEEL STERNAL CCS#1 18IN (SUTURE) IMPLANT
SUT STEEL SZ 6 DBL 3X14 BALL (SUTURE) ×4 IMPLANT
SUT TEM PAC WIRE 2 0 SH (SUTURE) ×8 IMPLANT
SUT VIC AB 1 CTX 36 (SUTURE)
SUT VIC AB 1 CTX36XBRD ANBCTR (SUTURE) IMPLANT
SUT VIC AB 2-0 CT1 27 (SUTURE) ×8
SUT VIC AB 2-0 CT1 TAPERPNT 27 (SUTURE) IMPLANT
SUT VIC AB 2-0 CTX 27 (SUTURE) ×6 IMPLANT
SUT VIC AB 3-0 SH 27 (SUTURE)
SUT VIC AB 3-0 SH 27X BRD (SUTURE) IMPLANT
SUT VIC AB 3-0 X1 27 (SUTURE) IMPLANT
SUT VICRYL 4-0 PS2 18IN ABS (SUTURE) ×2 IMPLANT
SUTURE E-PAK OPEN HEART (SUTURE) ×4 IMPLANT
SYR 50ML SLIP (SYRINGE) IMPLANT
SYSTEM SAHARA CHEST DRAIN ATS (WOUND CARE) ×7 IMPLANT
TAPE CLOTH SURG 4X10 WHT LF (GAUZE/BANDAGES/DRESSINGS) ×1 IMPLANT
TAPE PAPER 2X10 WHT MICROPORE (GAUZE/BANDAGES/DRESSINGS) ×2 IMPLANT
TOWEL OR 17X24 6PK STRL BLUE (TOWEL DISPOSABLE) ×8 IMPLANT
TOWEL OR 17X26 10 PK STRL BLUE (TOWEL DISPOSABLE) ×8 IMPLANT
TRAY FOLEY IC TEMP SENS 16FR (CATHETERS) ×4 IMPLANT
TUBING INSUFFLATION (TUBING) ×1 IMPLANT
UNDERPAD 30X30 INCONTINENT (UNDERPADS AND DIAPERS) ×4 IMPLANT
WATER STERILE IRR 1000ML POUR (IV SOLUTION) ×8 IMPLANT
YANKAUER SUCT BULB TIP NO VENT (SUCTIONS) ×3 IMPLANT

## 2016-01-04 NOTE — Progress Notes (Signed)
EKG CRITICAL VALUE     12 lead EKG performed.  Critical value noted.  Gwenith SpitzAvery Citty, RN notified.   Lanae BoastBREWER, Parvin Stetzer, CCT 01/04/2016 3:47 PM

## 2016-01-04 NOTE — Procedures (Signed)
Extubation Procedure Note  Patient Details:   Name: Luis Tucker DOB: 02/15/1951 MRN: [id number]   Airway Documentation:     Evaluation  O2 sats: stable throughout Complications: No apparent complications Patient did tolerate procedure well. Bilateral Breath Sounds: Clear Suctioning: Oral, Airway Yes   Patient was extubated to a 4L [place]. Cuff leak was heard. No stridor was noted. RN at bedside with RT. RT will continue to monitor.  Danella Maiersshley M [facility] 01/04/2016, 6:05 PM

## 2016-01-04 NOTE — Progress Notes (Signed)
Patient ID: Luis Tucker, male   DOB: 06/01/1951, 65 y.o.   MRN: [id number]  SICU Evening Rounds:   Hemodynamically stable  CI = 2.0  extubated  Urine output good  CT output low  CBC    Component Value Date/Time   WBC 11.4* 01/04/2016 1400   RBC 3.89* 01/04/2016 1400   HGB 11.2* 01/04/2016 1406   HCT 33.0* 01/04/2016 1406   PLT 102* 01/04/2016 1400   MCV 82.0 01/04/2016 1400   MCH 27.5 01/04/2016 1400   MCHC 33.5 01/04/2016 1400   RDW 13.2 01/04/2016 1400   LYMPHSABS 2.7 07/19/2015 1420   MONOABS 0.5 07/19/2015 1420   EOSABS 0.0 07/19/2015 1420   BASOSABS 0.0 07/19/2015 1420     BMET    Component Value Date/Time   NA 138 01/04/2016 1406   K 3.8 01/04/2016 1406   CL 99* 01/04/2016 1259   CO2 25 01/04/2016 0435   GLUCOSE 121* 01/04/2016 1406   BUN 17 01/04/2016 1259   CREATININE 1.00 01/04/2016 1259   CREATININE 1.19 12/20/2015 0835   CALCIUM 8.9 01/04/2016 0435   GFRNONAA 49* 01/04/2016 0435   GFRAA 57* 01/04/2016 0435     A/P:  Stable postop course. Continue current plans

## 2016-01-04 NOTE — OR Nursing (Signed)
13:20 - 20 minute call to SICU charge nurse 

## 2016-01-04 NOTE — Transfer of Care (Signed)
Immediate Anesthesia Transfer of Care Note  Patient: Luis Tucker  Procedure(s) Performed: Procedure(s): CORONARY ARTERY BYPASS GRAFTING (CABG) x two using left internal mammary artery and left radial artery  (N/A) RADIAL ARTERY HARVEST (Left) TRANSESOPHAGEAL ECHOCARDIOGRAM (TEE) (N/A)  Patient Location: SICU  Anesthesia Type:General  Level of Consciousness: sedated, unresponsive and Patient remains intubated per anesthesia plan  Airway & Oxygen Therapy: Patient remains intubated per anesthesia plan and Patient placed on Ventilator (see vital sign flow sheet for setting)  Post-op Assessment: Report given to RN and Post -op Vital signs reviewed and stable  Post vital signs: Reviewed and stable  Last Vitals:  Filed Vitals:   01/03/16 1329 01/04/16 0436  BP: 102/58 114/75  Pulse: 90 67  Temp: 36.4 C 36.8 C  Resp: 18 17    Complications: No apparent anesthesia complications

## 2016-01-04 NOTE — Brief Op Note (Addendum)
12/29/2015 - 01/04/2016  12:33 PM      [address]       [place] [id number]             [phone]     12/29/2015 - 01/04/2016  12:33 PM  PATIENT:  Luis Tucker  65 y.o. male  PRE-OPERATIVE DIAGNOSIS:  CAD  POST-OPERATIVE DIAGNOSIS:  CAD  PROCEDURE:  Procedure(s): CORONARY ARTERY BYPASS GRAFTING (CABG) x two using left internal mammary artery and left radial artery. LIMA-LAD; LEFT RADIAL-OM RADIAL ARTERY HARVEST TRANSESOPHAGEAL ECHOCARDIOGRAM (TEE)  SURGEON:    Purcell Nailslarence H Owen, MD  ASSISTANTS:  Rowe ClackWayne E Gold, PA-C    Edgar FriskBree Johnson, PA-S  ANESTHESIA:   Shelton SilvasKevin D Hollis, MD  CROSSCLAMP TIME:   48'  CARDIOPULMONARY BYPASS TIME: 6161'  FINDINGS:  Normal LV function  Good quality LIMA conduit for grafting  Good quality LRA conduit for grafting  Good quality target vessels for grafting  COMPLICATIONS: None  BASELINE WEIGHT: 105 kg  PATIENT DISPOSITION:   TO SICU IN STABLE CONDITION  Purcell Nailslarence H Owen, MD 01/04/2016 1:27 PM

## 2016-01-04 NOTE — Progress Notes (Signed)
RT note- Patients VC was 1.1L and NIF -10. RN talked to Dr Cornelius Moraswen about NIF measurement and per him it was ok to extubate patient as long as he was awake. Patient was awake and following commands prior to extubation. RT will continue to monitor.

## 2016-01-04 NOTE — Anesthesia Procedure Notes (Addendum)
Procedure Name: Intubation Date/Time: 01/04/2016 9:11 AM Performed by: Rise PatienceBELL, Victoria Euceda T Pre-anesthesia Checklist: Patient identified, Emergency Drugs available, Suction available and Patient being monitored Patient Re-evaluated:Patient Re-evaluated prior to inductionOxygen Delivery Method: Circle system utilized Preoxygenation: Pre-oxygenation with 100% oxygen Intubation Type: IV induction Ventilation: Mask ventilation without difficulty and Oral airway inserted - appropriate to patient size Laryngoscope Size: Hyacinth MeekerMiller and 2 Grade View: Grade II Tube type: Oral Tube size: 8.5 mm Number of attempts: 1 Airway Equipment and Method: Stylet and Oral airway Placement Confirmation: ETT inserted through vocal cords under direct vision,  positive ETCO2 and breath sounds checked- equal and bilateral Secured at: 23 cm Tube secured with: Tape Dental Injury: Teeth and Oropharynx as per pre-operative assessment

## 2016-01-04 NOTE — Care Management Note (Signed)
  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- CM will continue to follow for potential d/c needs- post op  Expected Discharge Date:     Expected Discharge Plan: Home/Self Care  In-House Referral:    Discharge planning Services CM Consult  Post Acute Care Choice:   Choice offered to:    DME Arranged:   DME Agency:    HH Arranged:   HH Agency:    Status of Service: In process, will continue to follow  Medicare Important Message Given:   Date Medicare IM Given:   Medicare IM give by:   Date Additional Medicare IM Given:   Additional Medicare Important Message give by:    If discussed at Long Length of Stay Meetings, dates discussed:   Additional Comments: 1323 01-02-16 Tomi BambergerBrenda Graves-Bigelow, RN,BSN [phone] Pt with abnormal Stress test- post cath-revealed significant disease. Plan for Effient washout and CABG on Thursday. CM will continue to monitor for disposition needs.   01-04-2016 post op CABG x2

## 2016-01-04 NOTE — Op Note (Addendum)
CARDIOTHORACIC SURGERY OPERATIVE NOTE  Date of Procedure: 01/04/2016  Preoperative Diagnosis:   Severe Multi-vessel Coronary Artery Disease  Unstable Angina Pectoris  Postoperative Diagnosis: Same  Procedure:    Coronary Artery Bypass Grafting x 2   Left Internal Mammary Artery to Distal Left Anterior Descending Coronary Artery  Left Radial Artery to Second Obtuse Marginal Branch of Left Circumflex Coronary Artery  Surgeon: Salvatore Decent. Cornelius Moras, MD  Assistant: Rowe Clack, PA-C   Edgar Frisk, PA-S  Anesthesia: Kaylyn Layer. Hart Rochester, MD  Operative Findings:  Normal LV function  Good quality LIMA conduit for grafting  Good quality LRA conduit for grafting  Good quality target vessels for grafting    BRIEF CLINICAL NOTE AND INDICATIONS FOR SURGERY  Patient is a 65 year old African-American male with history of coronary artery disease, hypertension, hyperlipidemia, borderline type 2 diabetes mellitus, and stage III chronic kidney disease who has been referred for surgical consultation to discuss treatment options for management of severe multivessel coronary artery disease. The patient's cardiac history dates back to 2008 when he reportedly suffered a mild acute non-ST segment elevation myocardial infarction. He underwent PCI and stenting of the left circumflex coronary artery at that time. He has been followed for the last several years by Dr. Rennis Golden. In July 2016 he presented with symptoms consistent with unstable angina. Catheterization performed at that time demonstrated severe ostial left circumflex coronary artery disease with "moderate" proximal left anterior descending coronary artery stenosis. He was treated with PCI and stenting using a Synergy drug eluting stent and started on Effient for dual antiplatelet therapy. 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 The scan was felt to be high  risk for ischemia. The patient underwent elective diagnostic catheterization yesterday. He was found to have high-grade ostial stenosis of the left circumflex coronary artery and 75% proximal stenosis of the left anterior descending coronary artery. Cardiothoracic surgical consultation was requested.  The patient has been seen in consultation and counseled at length regarding the indications, risks and potential benefits of surgery.  All questions have been answered, and the patient provides full informed consent for the operation as described.    DETAILS OF THE OPERATIVE PROCEDURE  Preparation:  The patient is brought to the operating room on the above mentioned date and central monitoring was established by the anesthesia team including placement of Swan-Ganz catheter and radial arterial line. The patient is placed in the supine position on the operating table.  Intravenous antibiotics are administered. General endotracheal anesthesia is induced uneventfully. A Foley catheter is placed.  Baseline transesophageal echocardiogram was performed.  Findings were notable for normal LV function.  The patient's chest, abdomen, both groins, and both lower extremities are prepared and draped in a sterile manner. A time out procedure is performed.   Surgical Approach and Conduit Harvest:  The left radial artery was harvested for use as a bypass graft through a longitudinal incision along the volar aspect of the left forearm.  All intervening side branches of the radial artery and its associated veins were divided using the harmonic scalpel.  After systemic heparinization the radial artery was transected proximally and distally and both stumps were oversewn using suture ligatures.  The forearm incision was closed using running absorbable suture.  A median sternotomy incision was performed and the left internal mammary artery is dissected from the chest wall and prepared for bypass grafting. The left internal  mammary  artery is notably good quality conduit.  Following systemic heparinization, the left internal mammary artery was transected distally noted to have excellent flow.   Extracorporeal Cardiopulmonary Bypass and Myocardial Protection:  The pericardium is opened. The ascending aorta is normal in appearance. The ascending aorta and the right atrium are cannulated for cardiopulmonary bypass.  Adequate heparinization is verified.     The entire pre-bypass portion of the operation was notable for stable hemodynamics.  Cardiopulmonary bypass was begun and the surface of the heart is inspected. Distal target vessels are selected for coronary artery bypass grafting. A cardioplegia cannula is placed in the ascending aorta.  A temperature probe was placed in the interventricular septum.  The patient is allowed to cool passively to [facility]32C systemic temperature.  The aortic cross clamp is applied and cold blood cardioplegia is delivered initially in an antegrade fashion through the aortic root. Iced saline slush is applied for topical hypothermia.  The initial cardioplegic arrest is rapid with early diastolic arrest.  Repeat doses of cardioplegia are administered intermittently throughout the entire cross clamp portion of the operation through the aortic root and through subsequently placed vein grafts in order to maintain completely flat electrocardiogram and septal myocardial temperature below 15C.  Myocardial protection was felt to be excellent.  Coronary Artery Bypass Grafting:   The second obtuse marginal branch of the left circumflex coronary artery was grafted using the left radial artery in an end-to-side fashion.  At the site of distal anastomosis the target vessel was good quality and measured approximately 1.5 mm in diameter.  The distal left anterior coronary artery was grafted with the left internal mammary artery in an end-to-side fashion.  At the site of distal anastomosis the target vessel was  good quality and measured approximately 2.0 mm in diameter.  All proximal vein graft anastomoses were placed directly to the ascending aorta prior to removal of the aortic cross clamp.  The septal myocardial temperature rose rapidly after reperfusion of the left internal mammary artery graft.  The aortic cross clamp was removed after a total cross clamp time of 48 minutes.   Procedure Completion:  All proximal and distal coronary anastomoses were inspected for hemostasis and appropriate graft orientation. Epicardial pacing wires are fixed to the right ventricular outflow tract and to the right atrial appendage. The patient is rewarmed to 37C temperature. The patient is weaned and disconnected from cardiopulmonary bypass.  The patient's rhythm at separation from bypass was sinus.  The patient was weaned from cardiopulmonary bypass without any inotropic support. Total cardiopulmonary bypass time for the operation was 61 minutes.  Followup transesophageal echocardiogram performed after separation from bypass revealed no changes from the preoperative exam.  The aortic and venous cannula were removed uneventfully. Protamine was administered to reverse the anticoagulation. The mediastinum and pleural space were inspected for hemostasis and irrigated with saline solution. The mediastinum and both pleural spaces were drained using 4 chest tubes placed through separate stab incisions inferiorly.  The soft tissues anterior to the aorta were reapproximated loosely. The sternum is closed with double strength sternal wire. The soft tissues anterior to the sternum were closed in multiple layers and the skin is closed with a running subcuticular skin closure.  The post-bypass portion of the operation was notable for stable rhythm and hemodynamics.  No blood products were administered during the operation.   Disposition:  The patient tolerated the procedure well and is transported to the surgical intensive care  in stable condition. There are no  intraoperative complications. All sponge instrument and needle counts are verified correct at completion of the operation.    Salvatore Decent. Cornelius Moras MD 01/04/2016 1:36 PM

## 2016-01-04 NOTE — Brief Op Note (Signed)
1st call to SICU 1245

## 2016-01-04 NOTE — Progress Notes (Signed)
  Echocardiogram 2D Echocardiogram has been performed.  Arvil ChacoFoster, Itzabella Sorrels 01/04/2016, 9:35 AM

## 2016-01-04 NOTE — Anesthesia Postprocedure Evaluation (Signed)
Anesthesia Post Note  Patient: Luis Tucker  Procedure(s) Performed: Procedure(s) (LRB): CORONARY ARTERY BYPASS GRAFTING (CABG) x two using left internal mammary artery and left radial artery  (N/A) RADIAL ARTERY HARVEST (Left) TRANSESOPHAGEAL ECHOCARDIOGRAM (TEE) (N/A)  Patient location during evaluation: ICU Anesthesia Type: General Level of consciousness: patient remains intubated per anesthesia plan Vital Signs Assessment: post-procedure vital signs reviewed and stable Respiratory status: patient remains intubated per anesthesia plan Cardiovascular status: stable Anesthetic complications: no    Last Vitals:  Filed Vitals:   01/04/16 0436 01/04/16 1345  BP: 114/75 110/66  Pulse: 67 87  Temp: 36.8 C   Resp: 17 16    Last Pain:  Filed Vitals:   01/04/16 1402  PainSc: 3                  Shelton SilvasKevin D Jebediah Macrae

## 2016-01-05 ENCOUNTER — Inpatient Hospital Stay (HOSPITAL_COMMUNITY): Payer: 59

## 2016-01-05 ENCOUNTER — Encounter (HOSPITAL_COMMUNITY): Payer: Self-pay | Admitting: Thoracic Surgery (Cardiothoracic Vascular Surgery)

## 2016-01-05 DIAGNOSIS — Z951 Presence of aortocoronary bypass graft: Secondary | ICD-10-CM

## 2016-01-05 LAB — BASIC METABOLIC PANEL
ANION GAP: 6 (ref 5–15)
BUN: 13 mg/dL (ref 6–20)
CHLORIDE: 106 mmol/L (ref 101–111)
CO2: 25 mmol/L (ref 22–32)
Calcium: 8.1 mg/dL — ABNORMAL LOW (ref 8.9–10.3)
Creatinine, Ser: 1.11 mg/dL (ref 0.61–1.24)
GFR calc Af Amer: >60 mL/min (ref >60)
GFR calc non Af Amer: >60 mL/min (ref >60)
GLUCOSE: 129 mg/dL — AB (ref 65–99)
POTASSIUM: 4.9 mmol/L (ref 3.5–5.1)
Sodium: 137 mmol/L (ref 135–145)

## 2016-01-05 LAB — GLUCOSE, CAPILLARY
GLUCOSE-CAPILLARY: 57 mg/dL — AB (ref 65–99)
GLUCOSE-CAPILLARY: 136 mg/dL — AB (ref 65–99)
GLUCOSE-CAPILLARY: 110 mg/dL — AB (ref 65–99)
GLUCOSE-CAPILLARY: 124 mg/dL — AB (ref 65–99)
GLUCOSE-CAPILLARY: 117 mg/dL — AB (ref 65–99)
GLUCOSE-CAPILLARY: 127 mg/dL — AB (ref 65–99)
Glucose-Capillary: 105 mg/dL — ABNORMAL HIGH (ref 65–99)
Glucose-Capillary: 108 mg/dL — ABNORMAL HIGH (ref 65–99)
Glucose-Capillary: 109 mg/dL — ABNORMAL HIGH (ref 65–99)
Glucose-Capillary: 113 mg/dL — ABNORMAL HIGH (ref 65–99)
Glucose-Capillary: 114 mg/dL — ABNORMAL HIGH (ref 65–99)
Glucose-Capillary: 128 mg/dL — ABNORMAL HIGH (ref 65–99)

## 2016-01-05 LAB — CREATININE, SERUM
Creatinine, Ser: 1.14 mg/dL (ref 0.61–1.24)
GFR calc non Af Amer: >60 mL/min (ref >60)

## 2016-01-05 LAB — MAGNESIUM
Magnesium: 2.3 mg/dL (ref 1.7–2.4)
Magnesium: 2.2 mg/dL (ref 1.7–2.4)

## 2016-01-05 LAB — CBC
HEMATOCRIT: 30.2 % — AB (ref 39.0–52.0)
HEMATOCRIT: 33 % — AB (ref 39.0–52.0)
HEMOGLOBIN: 10.6 g/dL — AB (ref 13.0–17.0)
Hemoglobin: 9.8 g/dL — ABNORMAL LOW (ref 13.0–17.0)
MCH: 27 pg (ref 26.0–34.0)
MCH: 27 pg (ref 26.0–34.0)
MCHC: 32.5 g/dL (ref 30.0–36.0)
MCHC: 32.1 g/dL (ref 30.0–36.0)
MCV: 83.2 fL (ref 78.0–100.0)
MCV: 84 fL (ref 78.0–100.0)
Platelets: 145 10*3/uL — ABNORMAL LOW (ref 150–400)
Platelets: 140 10*3/uL — ABNORMAL LOW (ref 150–400)
RBC: 3.63 MIL/uL — AB (ref 4.22–5.81)
RBC: 3.93 MIL/uL — ABNORMAL LOW (ref 4.22–5.81)
RDW: 13.6 % (ref 11.5–15.5)
RDW: 13.7 % (ref 11.5–15.5)
WBC: 14.5 10*3/uL — AB (ref 4.0–10.5)
WBC: 18.5 10*3/uL — AB (ref 4.0–10.5)

## 2016-01-05 LAB — POCT I-STAT, CHEM 8
BUN: 17 mg/dL (ref 6–20)
CALCIUM ION: 1.24 mmol/L (ref 1.13–1.30)
Chloride: 99 mmol/L — ABNORMAL LOW (ref 101–111)
Creatinine, Ser: 1.1 mg/dL (ref 0.61–1.24)
Glucose, Bld: 139 mg/dL — ABNORMAL HIGH (ref 65–99)
HEMATOCRIT: 34 % — AB (ref 39.0–52.0)
HEMOGLOBIN: 11.6 g/dL — AB (ref 13.0–17.0)
Potassium: 4.7 mmol/L (ref 3.5–5.1)
SODIUM: 137 mmol/L (ref 135–145)
TCO2: 27 mmol/L (ref 0–100)

## 2016-01-05 MED ORDER — CHLORHEXIDINE GLUCONATE CLOTH 2 % EX PADS
6.0000 | MEDICATED_PAD | Freq: Every day | CUTANEOUS | Status: DC
  Administered 2016-01-06 – 2016-01-09 (×4): 6 via TOPICAL

## 2016-01-05 MED ORDER — ISOSORBIDE MONONITRATE ER 30 MG PO TB24
15.0000 mg | ORAL_TABLET | Freq: Every day | ORAL | Status: DC
  Administered 2016-01-05: 15 mg via ORAL
  Filled 2016-01-05 (×2): qty 1

## 2016-01-05 MED ORDER — MORPHINE SULFATE (PF) 2 MG/ML IV SOLN
2.0000 mg | INTRAVENOUS | Status: DC | PRN
  Administered 2016-01-05: 2 mg via INTRAVENOUS
  Filled 2016-01-05: qty 1

## 2016-01-05 MED FILL — Sodium Bicarbonate IV Soln 8.4%: INTRAVENOUS | Qty: 50 | Status: AC

## 2016-01-05 MED FILL — Lidocaine HCl IV Inj 20 MG/ML: INTRAVENOUS | Qty: 5 | Status: AC

## 2016-01-05 MED FILL — Sodium Chloride IV Soln 0.9%: INTRAVENOUS | Qty: 2000 | Status: AC

## 2016-01-05 MED FILL — Heparin Sodium (Porcine) Inj 1000 Unit/ML: INTRAMUSCULAR | Qty: 20 | Status: AC

## 2016-01-05 MED FILL — Mannitol IV Soln 20%: INTRAVENOUS | Qty: 500 | Status: AC

## 2016-01-05 MED FILL — Electrolyte-R (PH 7.4) Solution: INTRAVENOUS | Qty: 3000 | Status: AC

## 2016-01-05 NOTE — Progress Notes (Addendum)
[address]       [place] [id number]             [phone]        CARDIOTHORACIC SURGERY PROGRESS NOTE   R1 Day Post-Op Procedure(s) (LRB): CORONARY ARTERY BYPASS GRAFTING (CABG) x two using left internal mammary artery and left radial artery  (N/A) RADIAL ARTERY HARVEST (Left) TRANSESOPHAGEAL ECHOCARDIOGRAM (TEE) (N/A)  Subjective: Feels okay.  Mild soreness in chest, primarily w/ coughing or deep inspiration  Objective: Vital signs: BP Readings from Last 1 Encounters:  01/05/16 100/70   Pulse Readings from Last 1 Encounters:  01/05/16 86   Resp Readings from Last 1 Encounters:  01/05/16 22   Temp Readings from Last 1 Encounters:  01/05/16 99.3 F (37.4 C)     Hemodynamics: PAP: (22-42)/(10-23) 37/15 mmHg CO:  [4.1 L/min-6.2 L/min] 5.8 L/min CI:  [1.8 L/min/m2-2.7 L/min/m2] 2.6 L/min/m2  Physical Exam:  Rhythm:   sinus  Breath sounds: Shallow but clear  Heart sounds:  RRR  Incisions:  Dressing dry, intact  Abdomen:  Soft, non-distended, non-tender  Extremities:  Warm, well-perfused, left hand perfusion looks good  Chest tubes:  decreasing volume thin serosanguinous output, no air leak    Intake/Output from previous day: 01/11 0701 - 01/12 0700 In: 7717.9 [I.V.:5721.9; Blood:346; IV Piggyback:1550] Out: 3361 [Urine:1660; Emesis/NG output:50; Blood:850; Chest Tube:801] Intake/Output this shift:    Lab Results:  CBC: Recent Labs  01/04/16 1918 01/05/16 0333  WBC 14.7* 14.5*  HGB 10.8* 9.8*  HCT 33.3* 30.2*  PLT 124* 145*    BMET:  Recent Labs  01/04/16 0435  01/04/16 1908 01/04/16 1918 01/05/16 0333  NA 135  < > 138  --  137  K 3.6  < > 4.9  --  4.9  CL 100*  < > 102  --  106  CO2 25  --   --   --  25  GLUCOSE 94  < > 128*  --  129*  BUN 22*  < > 20  --  13  CREATININE 1.47*  < > 1.10 1.13 1.11  CALCIUM 8.9  --   --   --  8.1*  < > = values in this interval not displayed.   PT/INR:   Recent Labs   01/04/16 1400  LABPROT 17.8*  INR 1.46    CBG (last 3)   Recent Labs  01/04/16 1933 01/04/16 2343 01/05/16 0349  GLUCAP 114* 128* 105*    ABG    Component Value Date/Time   PHART 7.316* 01/04/2016 1914   PCO2ART 49.9* 01/04/2016 1914   PO2ART 106.0* 01/04/2016 1914   HCO3 25.4* 01/04/2016 1914   TCO2 27 01/04/2016 1914   ACIDBASEDEF 1.0 01/04/2016 1914   O2SAT 97.0 01/04/2016 1914    CXR: Mild pulm vasc congestion and bibasilar atelectasis  EKG: NSR w/out acute ischemic changes     Assessment/Plan: S/P Procedure(s) (LRB): CORONARY ARTERY BYPASS GRAFTING (CABG) x two using left internal mammary artery and left radial artery  (N/A) RADIAL ARTERY HARVEST (Left) TRANSESOPHAGEAL ECHOCARDIOGRAM (TEE) (N/A)  Overall doing well POD1 Maintaining NSR w/ stable hemodynamics off all drips Expected post op acute blood loss anemia, mild Expected post op volume excess, mild Hypertension Hyperlipidemia CKD- stage III - creatinine at or below baseline w/ good UOP   Mobilize  D/C lines  Leave chest tubes in for now and d/c later today or am tormorrow  ASA, beta blocker, statin  Low dose Imdur for RA graft  SCD boots for DVT prophylaxis   Purcell Nails, MD 01/05/2016 7:40 AM

## 2016-01-05 NOTE — Progress Notes (Signed)
Patient ID: Luis Tucker, male   DOB: 10/02/1951, 65 y.o.   MRN: [id number] EVENING ROUNDS NOTE :     [address]       [place] [id number]             [phone]                 1 Day Post-Op Procedure(s) (LRB): CORONARY ARTERY BYPASS GRAFTING (CABG) x two using left internal mammary artery and left radial artery  (N/A) RADIAL ARTERY HARVEST (Left) TRANSESOPHAGEAL ECHOCARDIOGRAM (TEE) (N/A)  Total Length of Stay:  LOS: 7 days  BP 110/76 mmHg  Pulse 81  Temp(Src) 98 F (36.7 C) (Oral)  Resp 15  Ht 6' (1.829 m)  Wt 245 lb 6 oz (111.3 kg)  BMI 33.27 kg/m2  SpO2 98%  .Intake/Output      01/11 0701 - 01/12 0700 01/12 0701 - 01/13 0700   P.O.  240   I.V. (mL/kg) 5721.9 (51.4) 100 (0.9)   Blood 346    Other 100    IV Piggyback 1550 50   Total Intake(mL/kg) 7717.9 (69.3) 390 (3.5)   Urine (mL/kg/hr) 1660 (0.6) 285 (0.2)   Emesis/NG output 50 (0)    Blood 850 (0.3)    Chest Tube 801 (0.3) 370 (0.3)   Total Output 3361 655   Net +4356.9 -265          . sodium chloride       Lab Results  Component Value Date   WBC 18.5* 01/05/2016   HGB 10.6* 01/05/2016   HCT 33.0* 01/05/2016   PLT 140* 01/05/2016   GLUCOSE 139* 01/05/2016   CHOL 114 12/30/2015   TRIG 86 12/30/2015   HDL 28* 12/30/2015   LDLCALC 69 12/30/2015   ALT 32 12/30/2015   AST 29 12/30/2015   NA 137 01/05/2016   K 4.7 01/05/2016   CL 99* 01/05/2016   CREATININE 1.14 01/05/2016   BUN 17 01/05/2016   CO2 25 01/05/2016   TSH 1.072 12/20/2015   INR 1.46 01/04/2016   HGBA1C 5.9* 12/30/2015   Stable day, up to chair, chest tubes still in Watch wbc count  Delight OvensEdward B Keylor Rands MD  Beeper [phone] Office [phone] 01/05/2016 6:55 PM

## 2016-01-05 NOTE — Progress Notes (Signed)
EKG CRITICAL VALUE     12 lead EKG performed.  Critical value noted.  Marthann Schillerobert Mueller, RN notified.   Wandalee FerdinandKimberly Logan, [place] 01/05/2016 7:35 AM

## 2016-01-06 ENCOUNTER — Inpatient Hospital Stay (HOSPITAL_COMMUNITY): Payer: 59

## 2016-01-06 LAB — BASIC METABOLIC PANEL
Anion gap: 8 (ref 5–15)
BUN: 14 mg/dL (ref 6–20)
CALCIUM: 8.5 mg/dL — AB (ref 8.9–10.3)
CHLORIDE: 99 mmol/L — AB (ref 101–111)
CO2: 29 mmol/L (ref 22–32)
CREATININE: 1.12 mg/dL (ref 0.61–1.24)
GFR calc Af Amer: >60 mL/min (ref >60)
GFR calc non Af Amer: >60 mL/min (ref >60)
GLUCOSE: 104 mg/dL — AB (ref 65–99)
Potassium: 4.5 mmol/L (ref 3.5–5.1)
Sodium: 136 mmol/L (ref 135–145)

## 2016-01-06 LAB — CBC
HCT: 30 % — ABNORMAL LOW (ref 39.0–52.0)
Hemoglobin: 9.6 g/dL — ABNORMAL LOW (ref 13.0–17.0)
MCH: 27.1 pg (ref 26.0–34.0)
MCHC: 32 g/dL (ref 30.0–36.0)
MCV: 84.7 fL (ref 78.0–100.0)
PLATELETS: 122 10*3/uL — AB (ref 150–400)
RBC: 3.54 MIL/uL — AB (ref 4.22–5.81)
RDW: 13.9 % (ref 11.5–15.5)
WBC: 15.6 10*3/uL — ABNORMAL HIGH (ref 4.0–10.5)

## 2016-01-06 LAB — GLUCOSE, CAPILLARY
GLUCOSE-CAPILLARY: 101 mg/dL — AB (ref 65–99)
GLUCOSE-CAPILLARY: 121 mg/dL — AB (ref 65–99)
Glucose-Capillary: 101 mg/dL — ABNORMAL HIGH (ref 65–99)

## 2016-01-06 MED ORDER — PANTOPRAZOLE SODIUM 40 MG PO TBEC: 40.0000 mg | DELAYED_RELEASE_TABLET | Freq: Every day | ORAL | Status: DC | PRN

## 2016-01-06 MED ORDER — CARVEDILOL 12.5 MG PO TABS
12.5000 mg | ORAL_TABLET | Freq: Two times a day (BID) | ORAL | Status: DC
  Administered 2016-01-06 – 2016-01-09 (×7): 12.5 mg via ORAL
  Filled 2016-01-06 (×7): qty 1

## 2016-01-06 MED ORDER — FUROSEMIDE 10 MG/ML IJ SOLN
40.0000 mg | Freq: Once | INTRAMUSCULAR | Status: AC
  Administered 2016-01-06: 40 mg via INTRAVENOUS
  Filled 2016-01-06: qty 4

## 2016-01-06 MED ORDER — MOVING RIGHT ALONG BOOK
Freq: Once | Status: AC
  Administered 2016-01-06: 09:00:00
  Filled 2016-01-06: qty 1

## 2016-01-06 MED ORDER — CETYLPYRIDINIUM CHLORIDE 0.05 % MT LIQD
7.0000 mL | Freq: Two times a day (BID) | OROMUCOSAL | Status: DC
  Administered 2016-01-06 – 2016-01-07 (×4): 7 mL via OROMUCOSAL

## 2016-01-06 MED ORDER — SODIUM CHLORIDE 0.9 % IV SOLN: 250.0000 mL | INTRAVENOUS | Status: DC | PRN

## 2016-01-06 MED ORDER — SODIUM CHLORIDE 0.9 % IJ SOLN: 3.0000 mL | INTRAMUSCULAR | Status: DC | PRN

## 2016-01-06 MED ORDER — INSULIN ASPART 100 UNIT/ML ~~LOC~~ SOLN: 0.0000 [IU] | Freq: Three times a day (TID) | SUBCUTANEOUS | Status: DC

## 2016-01-06 MED ORDER — ISOSORBIDE MONONITRATE ER 30 MG PO TB24
15.0000 mg | ORAL_TABLET | Freq: Every day | ORAL | Status: DC
  Administered 2016-01-06 – 2016-01-09 (×4): 15 mg via ORAL
  Filled 2016-01-06 (×3): qty 1

## 2016-01-06 MED ORDER — POTASSIUM CHLORIDE CRYS ER 20 MEQ PO TBCR
20.0000 meq | EXTENDED_RELEASE_TABLET | Freq: Every day | ORAL | Status: DC
  Administered 2016-01-06 – 2016-01-09 (×4): 20 meq via ORAL
  Filled 2016-01-06 (×4): qty 1

## 2016-01-06 MED ORDER — ATORVASTATIN CALCIUM 80 MG PO TABS
80.0000 mg | ORAL_TABLET | Freq: Every day | ORAL | Status: DC
  Administered 2016-01-06 – 2016-01-09 (×4): 80 mg via ORAL
  Filled 2016-01-06 (×4): qty 1

## 2016-01-06 MED ORDER — SODIUM CHLORIDE 0.9 % IJ SOLN
3.0000 mL | Freq: Two times a day (BID) | INTRAMUSCULAR | Status: DC
  Administered 2016-01-06 – 2016-01-07 (×4): 3 mL via INTRAVENOUS

## 2016-01-06 MED ORDER — ASPIRIN EC 325 MG PO TBEC
325.0000 mg | DELAYED_RELEASE_TABLET | Freq: Every day | ORAL | Status: DC
  Administered 2016-01-06 – 2016-01-09 (×4): 325 mg via ORAL
  Filled 2016-01-06 (×4): qty 1

## 2016-01-06 MED ORDER — FUROSEMIDE 40 MG PO TABS
40.0000 mg | ORAL_TABLET | Freq: Every day | ORAL | Status: DC
  Administered 2016-01-07 – 2016-01-09 (×3): 40 mg via ORAL
  Filled 2016-01-06 (×3): qty 1

## 2016-01-06 NOTE — Progress Notes (Addendum)
      [address]       [place] [id number]             [phone]        CARDIOTHORACIC SURGERY PROGRESS NOTE   R2 Days Post-Op Procedure(s) (LRB): CORONARY ARTERY BYPASS GRAFTING (CABG) x two using left internal mammary artery and left radial artery  (N/A) RADIAL ARTERY HARVEST (Left) TRANSESOPHAGEAL ECHOCARDIOGRAM (TEE) (N/A)  Subjective: Patient says still with mild soreness with deep inspiration or coughing, mild nausea, otherwise feels okay.  Objective: Vital signs: BP Readings from Last 1 Encounters:  01/06/16 97/60   Pulse Readings from Last 1 Encounters:  01/06/16 89   Resp Readings from Last 1 Encounters:  01/06/16 12   Temp Readings from Last 1 Encounters:  01/06/16 98.8 F (37.1 C) Oral    Hemodynamics: PAP: (39)/(18) 39/18 mmHg  Physical Exam:  Rhythm:   sinus  Breath sounds: clear  Heart sounds:  Regular rate and rhythem  Incisions:  Dressings dry and intact  Abdomen:  Soft, non-distended, non-tender  Extremities:  Warm, well-perfused, left hand perfusion good, mild lower extremity edema  Chest Tubes:  Decreasing volume thin serosanguinous output, no air leak Intake/Output from previous day: 01/12 0701 - 01/13 0700 In: 780 [P.O.:530; I.V.:100; IV Piggyback:150] Out: 1680 [Urine:980; Chest Tube:700] Intake/Output this shift:    Lab Results:  CBC: Recent Labs  01/05/16 1716 01/06/16 0430  WBC 18.5* 15.6*  HGB 10.6* 9.6*  HCT 33.0* 30.0*  PLT 140* 122*    BMET:  Recent Labs  01/05/16 0333 01/05/16 1659 01/05/16 1716 01/06/16 0430  NA 137 137  --  136  K 4.9 4.7  --  4.5  CL 106 99*  --  99*  CO2 25  --   --  29  GLUCOSE 129* 139*  --  104*  BUN 13 17  --  14  CREATININE 1.11 1.10 1.14 1.12  CALCIUM 8.1*  --   --  8.5*     PT/INR:   Recent Labs  01/04/16 1400  LABPROT 17.8*  INR 1.46    CBG (last 3)   Recent Labs  01/05/16 1918 01/05/16 2353 01/06/16 0344  GLUCAP 127* 101* 101*    ABG      Component Value Date/Time   PHART 7.316* 01/04/2016 1914   PCO2ART 49.9* 01/04/2016 1914   PO2ART 106.0* 01/04/2016 1914   HCO3 25.4* 01/04/2016 1914   TCO2 27 01/05/2016 1659   ACIDBASEDEF 1.0 01/04/2016 1914   O2SAT 97.0 01/04/2016 1914    CXR: Mild pulmonary vascular congestion and bibasilar atelectasis  Assessment/Plan: S/P Procedure(s) (LRB): CORONARY ARTERY BYPASS GRAFTING (CABG) x two using left internal mammary artery and left radial artery  (N/A) RADIAL ARTERY HARVEST (Left) TRANSESOPHAGEAL ECHOCARDIOGRAM (TEE) (N/A)  Continues to improve POD2 Hemodynamically stable, maintaining NSR Expected post op acute blood loss anemia, mild Expected post op volume excess, mild Hypertension Hyperlipidemia CKD-stage III- creatinine at or below baseline, good UOP, foley out  -Mobilize -d/c chest tubes -continue ASA, beta blocker , statin, Imdur -SCD boots for DVT propylaxis    Edgar FriskJohnson, Bree, PA-S 01/06/2016 7:44 AM   I have seen and examined the patient and agree with the assessment and plan as outlined.  Consider restarting home dose ARB in 1-2 days.  Transfer 2W  Purcell Nailslarence H Owen, MD 01/06/2016 8:33 AM

## 2016-01-06 NOTE — Care Management Note (Signed)
Case Management Note  Patient Details  Name: [name] MRN: [id number] Date of Birth: 01/24/1951  Subjective/Objective:     Post op CABG on 1-11.  Has wife - independent prior - plan for discharge home with wife.                Action/Plan:   Expected Discharge Date:                  Expected Discharge Plan:  Home/Self Care  In-House Referral:     Discharge planning Services  CM Consult  Post Acute Care Choice:    Choice offered to:     DME Arranged:    DME Agency:     HH Arranged:    HH Agency:     Status of Service:  In process, will continue to follow  Medicare Important Message Given:    Date Medicare IM Given:    Medicare IM give by:    Date Additional Medicare IM Given:    Additional Medicare Important Message give by:     If discussed at Long Length of Stay Meetings, dates discussed:    Additional Comments:  Vangie BickerBrown, Anddy Wingert Jane, RN 01/06/2016, 10:44 AM

## 2016-01-06 NOTE — Care Management Note (Addendum)
Case Management Note  Patient Details  Name: Veatrice BourbonLacy L Ciccarelli MRN: [id number] Date of Birth: 04/13/1951  Subjective/Objective:     Post op CABG on 1-11.  Has wife - independent prior - plan for discharge home with wife.                Action/Plan:  Pt stated he is independent from home.  CM will continue to monitor for disposition needs   Expected Discharge Date:                  Expected Discharge Plan:  Home/Self Care  In-House Referral:     Discharge planning Services  CM Consult  Post Acute Care Choice:    Choice offered to:     DME Arranged:    DME Agency:     HH Arranged:    HH Agency:     Status of Service:  In process, will continue to follow  Medicare Important Message Given:    Date Medicare IM Given:    Medicare IM give by:    Date Additional Medicare IM Given:    Additional Medicare Important Message give by:     If discussed at Long Length of Stay Meetings, dates discussed:    Additional Comments: CM assessed pt; pt states that his sister Marily Memosdna will come and stay with pt at his home post discharge, sister will provide 24 hour care for at least a week post discharge. Cherylann ParrClaxton, Quanta Robertshaw S, RN 01/06/2016, 3:18 PM

## 2016-01-06 NOTE — Progress Notes (Signed)
CARDIAC REHAB PHASE I   PRE:  Rate/Rhythm: 88 SR  BP:  Sitting: 91/60        SaO2: 99 1.5 L  MODE:  Ambulation: 460 ft   POST:  Rate/Rhythm: 89 SR  BP:  Sitting: 119/68         SaO2: 97 2L  Pt in bed, states he just laid down and is tired. Not eager to walk, but agreeable to ambulate with encouragement. Pt states he has not been ambulating since before surgery. Pt stood independently, good use of sternal precautions. Pt c/o of feeling "a little woozy" upon standing, quickly resolved. Pt ambulated 460 ft on 2L O2, rolling walker, steady gait, tolerated well. Pt denies pain, dizziness, DOE, declined rest stop. Pt to bed after walk per pt request, states he would like to take a nap, call bell within reach. Will follow.   [phone] Joylene GrapesMonge, Joakim Huesman C, RN, BSN 01/06/2016 2:17 PM

## 2016-01-06 NOTE — Progress Notes (Signed)
Patient ambulated approx. 550 ft using a RW on 2 L of oxygen, VSS, patient tolerated well. Patient back in bed with call bell in reach.

## 2016-01-07 ENCOUNTER — Inpatient Hospital Stay (HOSPITAL_COMMUNITY): Payer: 59

## 2016-01-07 LAB — BASIC METABOLIC PANEL
Anion gap: 7 (ref 5–15)
BUN: 13 mg/dL (ref 6–20)
CO2: 29 mmol/L (ref 22–32)
CREATININE: 1.1 mg/dL (ref 0.61–1.24)
Calcium: 8.3 mg/dL — ABNORMAL LOW (ref 8.9–10.3)
Chloride: 102 mmol/L (ref 101–111)
GFR calc Af Amer: >60 mL/min (ref >60)
Glucose, Bld: 99 mg/dL (ref 65–99)
Potassium: 3.9 mmol/L (ref 3.5–5.1)
SODIUM: 138 mmol/L (ref 135–145)

## 2016-01-07 LAB — CBC
HCT: 27.8 % — ABNORMAL LOW (ref 39.0–52.0)
Hemoglobin: 8.9 g/dL — ABNORMAL LOW (ref 13.0–17.0)
MCH: 27 pg (ref 26.0–34.0)
MCHC: 32 g/dL (ref 30.0–36.0)
MCV: 84.2 fL (ref 78.0–100.0)
PLATELETS: 120 10*3/uL — AB (ref 150–400)
RBC: 3.3 MIL/uL — ABNORMAL LOW (ref 4.22–5.81)
RDW: 14.1 % (ref 11.5–15.5)
WBC: 10.8 10*3/uL — ABNORMAL HIGH (ref 4.0–10.5)

## 2016-01-07 MED ORDER — POLYETHYLENE GLYCOL 3350 17 G PO PACK
17.0000 g | PACK | Freq: Every day | ORAL | Status: DC
  Administered 2016-01-07 – 2016-01-09 (×2): 17 g via ORAL
  Filled 2016-01-07 (×3): qty 1

## 2016-01-07 MED ORDER — LACTULOSE 10 GM/15ML PO SOLN: 20.0000 g | Freq: Every day | ORAL | Status: DC | PRN

## 2016-01-07 NOTE — Progress Notes (Signed)
CARDIAC REHAB PHASE I   PRE:  Rate/Rhythm: 84 sinus rhythm  BP:  Supine:   Sitting: 112/64  Standing:    SaO2: 92% ra, pt requests to remove 02  MODE:  Ambulation: 550 ft   POST:  Rate/Rhythem: 90 sinus rhythm  BP:  Supine:   Sitting: 132/70  Standing:    SaO2: 95% ra  Pt ambulated in hallway using rolling walker x1 assist, slow steady gait. Pt c/o surgical incision discomfort post ambulation.  Pt returned to chair, feet propped up. Call light in reach.  Pt instructed to notify staff for assistance prior to getting up. Pt also instructed to increase IS use.  Understanding verbalized.    Luis Tucker, Luis Tucker

## 2016-01-07 NOTE — Progress Notes (Addendum)
      [address]       [place] [id number]             [phone]      3 Days Post-Op Procedure(s) (LRB): CORONARY ARTERY BYPASS GRAFTING (CABG) x two using left internal mammary artery and left radial artery  (N/A) RADIAL ARTERY HARVEST (Left) TRANSESOPHAGEAL ECHOCARDIOGRAM (TEE) (N/A)   Subjective:  [name] states he has not moved his bowels.  He is worried about becoming constipated.  + ambulation Objective: Vital signs in last 24 hours: Temp:  [98.1 F (36.7 C)-99.3 F (37.4 C)] 99.3 F (37.4 C) (01/14 0358) Pulse Rate:  [90-103] 90 (01/14 0358) Cardiac Rhythm:  [-] Normal sinus rhythm (01/13 2100) Resp:  [15-28] 18 (01/14 0358) BP: (103-127)/(61-75) 106/61 mmHg (01/14 0358) SpO2:  [94 %-98 %] 97 % (01/14 0358) Weight:  [238 lb 15.7 oz (108.4 kg)] 238 lb 15.7 oz (108.4 kg) (01/14 0224)  Intake/Output from previous day: 01/13 0701 - 01/14 0700 In: 420 [P.O.:420] Out: 1100 [Urine:1000; Chest Tube:100]  General appearance: alert, cooperative and no distress Heart: regular rate and rhythm Lungs: diminished breath sounds LLL Abdomen: soft, non-tender; bowel sounds normal; no masses,  no organomegaly Extremities: edema trace Wound: clean and dry  Lab Results:  Recent Labs  01/06/16 0430 01/07/16 0410  WBC 15.6* 10.8*  HGB 9.6* 8.9*  HCT 30.0* 27.8*  PLT 122* 120*   BMET:  Recent Labs  01/06/16 0430 01/07/16 0410  NA 136 138  K 4.5 3.9  CL 99* 102  CO2 29 29  GLUCOSE 104* 99  BUN 14 13  CREATININE 1.12 1.10  CALCIUM 8.5* 8.3*    PT/INR:  Recent Labs  01/04/16 1400  LABPROT 17.8*  INR 1.46   ABG    Component Value Date/Time   PHART 7.316* 01/04/2016 1914   HCO3 25.4* 01/04/2016 1914   TCO2 27 01/05/2016 1659   ACIDBASEDEF 1.0 01/04/2016 1914   O2SAT 97.0 01/04/2016 1914   CBG (last 3)   Recent Labs  01/05/16 2353 01/06/16 0344 01/06/16 0828  GLUCAP 101* 101* 121*    Assessment/Plan: S/P Procedure(s)  (LRB): CORONARY ARTERY BYPASS GRAFTING (CABG) x two using left internal mammary artery and left radial artery  (N/A) RADIAL ARTERY HARVEST (Left) TRANSESOPHAGEAL ECHOCARDIOGRAM (TEE) (N/A)  1. CV- NSR, BP in controlled- continue Coreg, Imdur for radial graft 2. Pulm- no acute issues, off oxygen, + atelectasis, small effusion on L- encouraged use of IS 3. Renal- creatinine remains stable, weight is trending down, continue Lasix 4. Expected post operative blood loss anemia- Hgb at 8.9 5. LOC constipation- will start Miralax, Lactulose prn 6. ID- low grade temp, no leukocytosis, wounds clean.... Likely atelectasis 7. Dispo- patient maintaining NSR, will plan to d/c EPW in AM, diurese, patient doing well  LOS: 9 days    Luis PitcherBARRETT, ERIN 01/07/2016  Continues to do very well patient examined and medical record reviewed,agree with above note. Luis Nationseter Van Trigt III 01/07/2016

## 2016-01-08 DIAGNOSIS — E785 Hyperlipidemia, unspecified: Secondary | ICD-10-CM

## 2016-01-08 DIAGNOSIS — I10 Essential (primary) hypertension: Secondary | ICD-10-CM

## 2016-01-08 NOTE — Progress Notes (Signed)
     Subjective:  POD # 4 CABG X 2 (LIMA, L Radial graft). No CP/SOB. Ambulating  Objective:  Temp:  [97.7 F (36.5 C)-98.5 F (36.9 C)] 97.7 F (36.5 C) (01/15 0509) Pulse Rate:  [81-93] 85 (01/15 0509) Resp:  [18] 18 (01/15 0509) BP: (83-113)/(52-68) 113/65 mmHg (01/15 0509) SpO2:  [91 %-97 %] 92 % (01/15 0509) Weight:  [235 lb (106.595 kg)] 235 lb (106.595 kg) (01/15 0509) Weight change: -3 lb 15.7 oz (-1.805 kg)  Intake/Output from previous day: 01/14 0701 - 01/15 0700 In: 243 [P.O.:240; I.V.:3] Out: 900 [Urine:900]  Intake/Output from this shift:    Physical Exam: General appearance: alert and no distress Neck: no adenopathy, no carotid bruit, no JVD, supple, symmetrical, trachea midline and thyroid not enlarged, symmetric, no tenderness/mass/nodules Lungs: clear to auscultation bilaterally Heart: regular rate and rhythm, S1, S2 normal, no murmur, click, rub or gallop Extremities: extremities normal, atraumatic, no cyanosis or edema  Lab Results: Results for orders placed or performed during the hospital encounter of 12/29/15 (from the past 48 hour(s))  CBC     Status: Abnormal   Collection Time: 01/07/16  4:10 AM  Result Value Ref Range   WBC 10.8 (H) 4.0 - 10.5 K/uL   RBC 3.30 (L) 4.22 - 5.81 MIL/uL   Hemoglobin 8.9 (L) 13.0 - 17.0 g/dL   HCT 27.8 (L) 39.0 - 52.0 %   MCV 84.2 78.0 - 100.0 fL   MCH 27.0 26.0 - 34.0 pg   MCHC 32.0 30.0 - 36.0 g/dL   RDW 14.1 11.5 - 15.5 %   Platelets 120 (L) 150 - 400 K/uL  Basic metabolic panel     Status: Abnormal   Collection Time: 01/07/16  4:10 AM  Result Value Ref Range   Sodium 138 135 - 145 mmol/L   Potassium 3.9 3.5 - 5.1 mmol/L   Chloride 102 101 - 111 mmol/L   CO2 29 22 - 32 mmol/L   Glucose, Bld 99 65 - 99 mg/dL   BUN 13 6 - 20 mg/dL   Creatinine, Ser 1.10 0.61 - 1.24 mg/dL   Calcium 8.3 (L) 8.9 - 10.3 mg/dL   GFR calc non Af Amer >60 >60 mL/min   GFR calc Af Amer >60 >60 mL/min    Comment: (NOTE) The  eGFR has been calculated using the CKD EPI equation. This calculation has not been validated in all clinical situations. eGFR's persistently <60 mL/min signify possible Chronic Kidney Disease.    Anion gap 7 5 - 15    Imaging: Imaging results have been reviewed  Tele: NSR  Assessment/Plan:   1. Principal Problem: 2.   S/P CABG x 2 3. Active Problems: 4.   HTN (hypertension) 5.   Hyperlipidemia 6.   Abnormal nuclear cardiac imaging test 7.   Coronary artery disease due to lipid rich plaque 8.   CAD (coronary artery disease) 9.   Unstable angina ([place]) 10.   Acute bursitis of left shoulder 11.   Time Spent Directly with Patient:  15 minutes  Length of Stay:  LOS: 10 days   POD # $ CABG X2 LIMA/ Left Radial for Dom LCX ostial 99% ISR and Mod prox LAD Dz. Looks great. VSS. No CP/SOB. Exam benign. Labs OK. Prob home in AM tomorrow. F/U with Dr. Debara Pickett.   Luis Tucker 01/08/2016, 8:44 AM

## 2016-01-08 NOTE — Discharge Instructions (Signed)
Coronary Artery Bypass Grafting, Care After °Refer to this sheet in the next few weeks. These instructions provide you with information on caring for yourself after your procedure. Your health care provider may also give you more specific instructions. Your treatment has been planned according to current medical practices, but problems sometimes occur. Call your health care provider if you have any problems or questions after your procedure. °WHAT TO EXPECT AFTER THE PROCEDURE °Recovery from surgery will be different for everyone. Some people feel well after 3 or 4 weeks, while for others it takes longer. After your procedure, it is typical to have the following: °· Nausea and a lack of appetite.   °· Constipation. °· Weakness and fatigue.   °· Depression or irritability.   °· Pain or discomfort at your incision site. °HOME CARE INSTRUCTIONS °· Take medicines only as directed by your health care provider. Do not stop taking medicines or start any new medicines without first checking with your health care provider. °· Take your pulse as directed by your health care provider. °· Perform deep breathing as directed by your health care provider. If you were given a device called an incentive spirometer, use it to practice deep breathing several times a day. Support your chest with a pillow or your arms when you take deep breaths or cough. °· Keep incision areas clean, dry, and protected. Remove or change any bandages (dressings) only as directed by your health care provider. You may have skin adhesive strips over the incision areas. Do not take the strips off. They will fall off on their own. °· Check incision areas daily for any swelling, redness, or drainage. °· If incisions were made in your legs, do the following: °¨ Avoid crossing your legs.   °¨ Avoid sitting for long periods of time. Change positions every 30 minutes.   °¨ Elevate your legs when you are sitting. °· Wear compression stockings as directed by your  health care provider. These stockings help keep blood clots from forming in your legs. °· Take showers once your health care provider approves. Until then, only take sponge baths. Pat incisions dry. Do not rub incisions with a washcloth or towel. Do not take baths, swim, or use a hot tub until your health care provider approves. °· Eat foods that are high in fiber, such as raw fruits and vegetables, whole grains, beans, and nuts. Meats should be lean cut. Avoid canned, processed, and fried foods. °· Drink enough fluid to keep your urine clear or pale yellow. °· Weigh yourself every day. This helps identify if you are retaining fluid that may make your heart and lungs work harder. °· Rest and limit activity as directed by your health care provider. You may be instructed to: °¨ Stop any activity at once if you have chest pain, shortness of breath, irregular heartbeats, or dizziness. Get help right away if you have any of these symptoms. °¨ Move around frequently for short periods or take short walks as directed by your health care provider. Increase your activities gradually. You may need physical therapy or cardiac rehabilitation to help strengthen your muscles and build your endurance. °¨ Avoid lifting, pushing, or pulling anything heavier than 10 lb (4.5 kg) for at least 6 weeks after surgery. °· Do not drive until your health care provider approves.  °· Ask your health care provider when you may return to work. °· Ask your health care provider when you may resume sexual activity. °· Keep all follow-up visits as directed by your health care   provider. This is important. °SEEK MEDICAL CARE IF: °· You have swelling, redness, increasing pain, or drainage at the site of an incision. °· You have a fever. °· You have swelling in your ankles or legs. °· You have pain in your legs.   °· You gain 2 or more pounds (0.9 kg) a day. °· You are nauseous or vomit. °· You have diarrhea.  °SEEK IMMEDIATE MEDICAL CARE IF: °· You have  chest pain that goes to your jaw or arms. °· You have shortness of breath.   °· You have a fast or irregular heartbeat.   °· You notice a "clicking" in your breastbone (sternum) when you move.   °· You have numbness or weakness in your arms or legs. °· You feel dizzy or light-headed.   °MAKE SURE YOU: °· Understand these instructions. °· Will watch your condition. °· Will get help right away if you are not doing well or get worse. °  °This information is not intended to replace advice given to you by your health care provider. Make sure you discuss any questions you have with your health care provider. °  °Document Released: 06/29/2005 Document Revised: 12/31/2014 Document Reviewed: 05/19/2013 °Elsevier Interactive Patient Education ©2016 [employer] ° °

## 2016-01-08 NOTE — Progress Notes (Signed)
Frequent VS obtained via [employer] auto-set per order following d/c pacing wires. VS remained stable, patient w/o complaint. However, VS record was unintentionally deleted before being entered into the permanent electronic record.

## 2016-01-08 NOTE — Progress Notes (Addendum)
      [address]       [place] [id number]             [phone]      4 Days Post-Op Procedure(s) (LRB): CORONARY ARTERY BYPASS GRAFTING (CABG) x two using left internal mammary artery and left radial artery  (N/A) RADIAL ARTERY HARVEST (Left) TRANSESOPHAGEAL ECHOCARDIOGRAM (TEE) (N/A)   Subjective:  [name] has no new complaints.  He was able to move his bowels.  + ambulation  Objective: Vital signs in last 24 hours: Temp:  [97.7 F (36.5 C)-98.5 F (36.9 C)] 97.7 F (36.5 C) (01/15 0509) Pulse Rate:  [81-93] 85 (01/15 0509) Cardiac Rhythm:  [-] Normal sinus rhythm (01/14 2009) Resp:  [18] 18 (01/15 0509) BP: (83-113)/(52-68) 113/65 mmHg (01/15 0509) SpO2:  [91 %-97 %] 92 % (01/15 0509) Weight:  [235 lb (106.595 kg)] 235 lb (106.595 kg) (01/15 0509)  Intake/Output from previous day: 01/14 0701 - 01/15 0700 In: 243 [P.O.:240; I.V.:3] Out: 900 [Urine:900]  General appearance: alert, cooperative and no distress Heart: regular rate and rhythm Lungs: clear to auscultation bilaterally Abdomen: soft, non-tender; bowel sounds normal; no masses,  no organomegaly Extremities: edema trace Wound: clean and dry  Lab Results:  Recent Labs  01/06/16 0430 01/07/16 0410  WBC 15.6* 10.8*  HGB 9.6* 8.9*  HCT 30.0* 27.8*  PLT 122* 120*   BMET:  Recent Labs  01/06/16 0430 01/07/16 0410  NA 136 138  K 4.5 3.9  CL 99* 102  CO2 29 29  GLUCOSE 104* 99  BUN 14 13  CREATININE 1.12 1.10  CALCIUM 8.5* 8.3*    PT/INR: No results for input(s): LABPROT, INR in the last 72 hours. ABG    Component Value Date/Time   PHART 7.316* 01/04/2016 1914   HCO3 25.4* 01/04/2016 1914   TCO2 27 01/05/2016 1659   ACIDBASEDEF 1.0 01/04/2016 1914   O2SAT 97.0 01/04/2016 1914   CBG (last 3)   Recent Labs  01/05/16 2353 01/06/16 0344 01/06/16 0828  GLUCAP 101* 101* 121*    Assessment/Plan: S/P Procedure(s) (LRB): CORONARY ARTERY BYPASS GRAFTING (CABG) x two  using left internal mammary artery and left radial artery  (N/A) RADIAL ARTERY HARVEST (Left) TRANSESOPHAGEAL ECHOCARDIOGRAM (TEE) (N/A)  1. CV- NSR, BP remains too labile for ACE/ARB- continue Coreg, Imdur for radial graft 2. Pulm- no acute issues, good use of IS 3. Renal- weight continues to trend down, continue Lasix 4. ID- fever resolved, continues to have no evidence of infection 5. Dispo- patient stable, will d/c EPW today, likely home in AM   LOS: 10 days    BARRETT, ERIN 01/08/2016  patient examined and medical record reviewed,agree with above note. Kathlee Nationseter Van Trigt III 01/08/2016

## 2016-01-08 NOTE — Discharge Summary (Signed)
Physician Discharge Summary  Patient ID: Luis Tucker MRN: [id number] DOB/AGE: 63/03/1951 63 y.o.  Admit date: 12/29/2015 Discharge date: 01/09/2016  Admission Diagnoses:  Patient Active Problem List   Diagnosis Date Noted  . Acute bursitis of left shoulder 01/03/2016  . Unstable angina (HCC) 01/02/2016  . CAD (coronary artery disease) 12/29/2015  . Coronary artery disease due to lipid rich plaque   . Abnormal nuclear cardiac imaging test 12/20/2015  . 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 Exertional dyspnea 07/19/2015  . CAD S/P CFX PCI x 2 08/04/2013  . Metabolic syndrome 08/04/2013  . HTN (hypertension) 08/04/2013  . Hyperlipidemia 08/04/2013   Discharge Diagnoses:   Patient Active Problem List   Diagnosis Date Noted  . S/P CABG x 2 01/04/2016  . Acute bursitis of left shoulder 01/03/2016  . Unstable angina (HCC) 01/02/2016  . CAD (coronary artery disease) 12/29/2015  . Coronary artery disease due to lipid rich plaque   . Abnormal nuclear cardiac imaging test 12/20/2015  . 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 Exertional dyspnea 07/19/2015  . CAD S/P CFX PCI x 2 08/04/2013  . Metabolic syndrome 08/04/2013  . HTN (hypertension) 08/04/2013  . Hyperlipidemia 08/04/2013   Discharged Condition: good  History of present Illness;   [name] is a is a 65 year old African-American male with history of coronary artery disease, hypertension, hyperlipidemia, borderline type 2 diabetes mellitus, and stage III chronic kidney disease. The patient's cardiac history dates back to 2008 when he reportedly suffered a mild acute non-ST segment elevation myocardial infarction. He underwent PCI and stenting of the left circumflex coronary artery at that time. He has been followed for the last several years by Dr. Rennis GoldenHilty. In July 2016 he presented with symptoms consistent with unstable angina. Catheterization performed at that time demonstrated severe ostial left circumflex coronary artery disease  with "moderate" proximal left anterior descending coronary artery stenosis. He was treated with PCI and stenting using a Synergy drug eluting stent and started on Effient for dual antiplatelet therapy. The patient is a Marine scientistlong-haul truck driver and he recently underwent a nuclear stress test in order to pass his annual DOT physical. The scan was felt to be high risk for ischemia. The patient underwent elective diagnostic catheterization yesterday. He was found to have high-grade ostial stenosis of the left circumflex coronary artery and 75% proximal stenosis of the left anterior descending coronary artery. Cardiothoracic surgical consultation was requested.  He was evaluated by Dr. Cornelius Moraswen at which time the patient stated he has experienced some mild substernal chest tightness with more strenuous physical exertion while at work.  These symptoms typically resolved with rest.  He also experiences exertional shortness of breath regularly which also occurs with more strenuous activity.  Dr. Cornelius Moraswen reviewed the patients catheterization films and was in agreement the patient would benefit from CABG procedure.  The risks and benefits of the procedure were explained to the patient and he was agreeable to proceed.  Hospital Course:   [name] was taken to the operating room on 01/04/2016.  He underwent CABG x 2 utilizing LIMA to LAD, and a Free Left Radial Artery to OM2.  He underwent open harvest of his left radial artery.  The patient tolerated the procedure without difficulty and was taken to the SICU in stable condition.  The patient was extubated the evening of surgery.  During his stay in the SICU the patients chest tubes and arterial lines were removed without difficulty.  He was started on Imdur for  his radial artery graft.  He was maintaining NSR and felt medically stable for transfer to the telemetry unit on POD #2.  His blood pressure has been labile so he will not be restarted on his home Cozaar.  He will be  resumed on his home HCTZ for diuretic therapy.  He will no longer require Effient. The patient has continued to progress.  His pacing wires have been removed without difficulty.  He is ambulating independently.  His blood pressure and heart rate are under good control.  He is felt medically stable for discharge today.      Significant Diagnostic Studies: angiography:    Ost RCA lesion, 50% stenosed.  LM lesion, 20% stenosed.  Mid Cx to Dist Cx lesion, 10% stenosed. The lesion was previously treated with a stent (unknown type) .  Dist Cx lesion, 50% stenosed.  Mid LAD lesion, 60% stenosed.  The left ventricular systolic function is normal.  Ost Cx to Prox Cx lesion, 95% stenosed. There is a 0% residual stenosis post intervention. The lesion was not previously treated.  A drug-eluting stent was placed.  Treatments: surgery:    Coronary Artery Bypass Grafting x 2 Left Internal Mammary Artery to Distal Left Anterior Descending Coronary Artery Left Radial Artery to Second Obtuse Marginal Branch of Left Circumflex Coronary Artery  Disposition: 01-Home or Self Care   Discharge Medications:  The patient has been discharged on:   1.Beta Blocker:  Yes [ x  ]                              No   [   ]                              If No, reason:  2.Ace Inhibitor/ARB: Yes [   ]                                     No  [ x   ]                                     If No, reason: labile BP  3.Statin:   Yes [ x  ]                  No  [   ]                  If No, reason:  4.Ecasa:  Yes  [ x  ]                  No   [   ]                  If No, reason:        Medication List    STOP taking these medications        EFFIENT 10 MG Tabs tablet  Generic drug:  prasugrel     losartan 25 MG tablet  Commonly known as:  COZAAR     nitroGLYCERIN 0.4 MG SL tablet  Commonly known as:  NITROSTAT     triamterene-hydrochlorothiazide 37.5-25 MG capsule   Commonly known as:  DYAZIDE  TAKE these medications        acetaminophen 500 MG tablet  Commonly known as:  TYLENOL  Take 2 tablets (1,000 mg total) by mouth every 6 (six) hours as needed.     aspirin 325 MG EC tablet  Take 1 tablet (325 mg total) by mouth daily.     atorvastatin 80 MG tablet  Commonly known as:  LIPITOR  Take 80 mg by mouth daily.     carvedilol 12.5 MG tablet  Commonly known as:  COREG  Take 12.5 mg by mouth 2 (two) times daily with a meal.     hydrochlorothiazide 12.5 MG capsule  Commonly known as:  MICROZIDE  Take 2 capsules (25 mg total) by mouth daily.     isosorbide mononitrate 30 MG 24 hr tablet  Commonly known as:  IMDUR  Take 0.5 tablets (15 mg total) by mouth daily.     pantoprazole 40 MG tablet  Commonly known as:  PROTONIX  Take 1 tablet (40 mg total) by mouth daily as needed (acid reflux).     traMADol 50 MG tablet  Commonly known as:  ULTRAM  Take 1-2 tablets (50-100 mg total) by mouth every 4 (four) hours as needed for moderate pain.       Follow-up Information    Follow up with Purcell Nails, MD In 4 weeks.   Specialty:  Cardiothoracic Surgery   Why:  The office will contact you with appointment.  Please get CXR 30 min prior to your appointment with Dr. Lorrene Reid information:   [address] [place] [address] [phone]       Follow up with Cardiology.   Why:  Offic will contact you with appointment      Signed: Lowella Dandy 01/09/2016, 8:02 AM

## 2016-01-08 NOTE — Progress Notes (Signed)
dc'ed pacing wires pt. tolerated well 

## 2016-01-09 MED ORDER — ACETAMINOPHEN 500 MG PO TABS: 1000.0000 mg | ORAL_TABLET | Freq: Four times a day (QID) | ORAL | Status: DC | PRN

## 2016-01-09 MED ORDER — ASPIRIN 325 MG PO TBEC: 325.0000 mg | DELAYED_RELEASE_TABLET | Freq: Every day | ORAL | Status: DC

## 2016-01-09 MED ORDER — HYDROCHLOROTHIAZIDE 12.5 MG PO CAPS: 25.0000 mg | ORAL_CAPSULE | Freq: Every day | ORAL | Status: DC

## 2016-01-09 MED ORDER — CYCLOBENZAPRINE HCL 10 MG PO TABS
10.0000 mg | ORAL_TABLET | Freq: Once | ORAL | Status: AC
  Administered 2016-01-09: 10 mg via ORAL
  Filled 2016-01-09: qty 1

## 2016-01-09 MED ORDER — TRAMADOL HCL 50 MG PO TABS: 50.0000 mg | ORAL_TABLET | ORAL | Status: DC | PRN

## 2016-01-09 NOTE — Progress Notes (Signed)
      [address]       [place] [id number]             [phone]        CARDIOTHORACIC SURGERY PROGRESS NOTE   R5 Days Post-Op Procedure(s) (LRB): CORONARY ARTERY BYPASS GRAFTING (CABG) x two using left internal mammary artery and left radial artery  (N/A) RADIAL ARTERY HARVEST (Left) TRANSESOPHAGEAL ECHOCARDIOGRAM (TEE) (N/A)  Subjective: Patient says he feels well and is ready to go home.   Objective: Vital signs: BP Readings from Last 1 Encounters:  01/09/16 115/72   Pulse Readings from Last 1 Encounters:  01/09/16 81   Resp Readings from Last 1 Encounters:  01/09/16 18   Temp Readings from Last 1 Encounters:  01/09/16 97.9 F (36.6 C) Oral    Hemodynamics:  Stable  Physical Exam:  Rhythm:   Regular rate and rhythem  Breath sounds: Clear to anterior ascultation  Heart sounds:  No murmurs, rubs, gallops  Incisions:  Clean, dry, no swelling, no erythema  Abdomen:  Soft, non-tender, non- distended  Extremities:  Warm, pulses 2+   Intake/Output from previous day: 01/15 0701 - 01/16 0700 In: 720 [P.O.:720] Out: -  Intake/Output this shift:    Lab Results:  CBC: Recent Labs  01/07/16 0410  WBC 10.8*  HGB 8.9*  HCT 27.8*  PLT 120*    BMET:  Recent Labs  01/07/16 0410  NA 138  K 3.9  CL 102  CO2 29  GLUCOSE 99  BUN 13  CREATININE 1.10  CALCIUM 8.3*     PT/INR:  No results for input(s): LABPROT, INR in the last 72 hours.  CBG (last 3)   Recent Labs  01/06/16 0828  GLUCAP 121*    ABG    Component Value Date/Time   PHART 7.316* 01/04/2016 1914   PCO2ART 49.9* 01/04/2016 1914   PO2ART 106.0* 01/04/2016 1914   HCO3 25.4* 01/04/2016 1914   TCO2 27 01/05/2016 1659   ACIDBASEDEF 1.0 01/04/2016 1914   O2SAT 97.0 01/04/2016 1914    CXR: Mild atelectasis. No pulmonary edema, no mediastinal widening.  Assessment/Plan: S/P Procedure(s) (LRB): CORONARY ARTERY BYPASS GRAFTING (CABG) x two using left internal  mammary artery and left radial artery  (N/A) RADIAL ARTERY HARVEST (Left) TRANSESOPHAGEAL ECHOCARDIOGRAM (TEE) (N/A)  1. CV-hemodynamically stable- continue on Imdur for radial graft, continue Coreg, Hx of stent- 2. Pulmonary-cxr unremarkable, no acute issues. 3. Renal- weight at baseline. Taper Lasix 4. Disposition-patient is stable , plan to discharge home today.  Edgar FriskJohnson, Bree, PA-S 01/09/2016 8:01 AM  I have seen and examined the patient and agree with the assessment and plan as outlined.  D/C home today.  Will continue to hold ARB until BP increases further.  Otherwise resume all pre-op meds except for Effient.  Could stop Imdur and restart ARB within a few weeks.  Purcell Nailslarence H Kellye Mizner, MD 01/09/2016 8:23 AM

## 2016-01-09 NOTE — Progress Notes (Signed)
Walking independently, no RW needed. Ed completed with pt. Voiced understanding and requests his referral be sent to [facility]. Declines watching d/c video. Motivated to care for himself/make changes. [phone] Ethelda ChickKristan Xavien Tucker CES, ACSM 9:49 AM 01/09/2016

## 2016-01-09 NOTE — Progress Notes (Signed)
Pt. Discharged to home  Pt. D/C'd via wheelchair escorted with volunteers and girlfriend Discharge information reviewed and given All personal belongings given to Pt.  Education discussed and understood IV was d/c and intact upon removal Tele d/c  sutures removed Sacora Hawbaker 12:20 PM

## 2016-01-09 NOTE — Care Management Note (Signed)
Case Management Note  Patient Details  Name: Veatrice BourbonLacy L Canelo MRN: [id number] Date of Birth: 08/22/1951  Subjective/Objective:     Post op CABG on 1-11.  Has wife - independent prior - plan for discharge home with wife.                Action/Plan:  Pt stated he is independent from home.  CM will continue to monitor for disposition needs   Expected Discharge Date:                  Expected Discharge Plan:  Home/Self Care  In-House Referral:     Discharge planning Services  CM Consult  Post Acute Care Choice:    Choice offered to:     DME Arranged:    DME Agency:     HH Arranged:    HH Agency:     Status of Service:  Complete, will sign off  Medicare Important Message Given:    Date Medicare IM Given:    Medicare IM give by:    Date Additional Medicare IM Given:    Additional Medicare Important Message give by:     If discussed at Long Length of Stay Meetings, dates discussed:    Additional Comments: 01/09/2016 Pt will discharge home today in care of sister Marily Memosdna  01/06/16 CM assessed pt; pt states that his sister Marily Memosdna will come and stay with pt at his home post discharge, sister will provide 24 hour care for at least a week post discharge. Cherylann ParrClaxton, Chamya Hunton S, RN 01/09/2016, 8:50 AM

## 2016-01-09 NOTE — Progress Notes (Signed)
      [address]       [place] [address]             [phone]      5 Days Post-Op Procedure(s) (LRB): CORONARY ARTERY BYPASS GRAFTING (CABG) x two using left internal mammary artery and left radial artery  (N/A) RADIAL ARTERY HARVEST (Left) TRANSESOPHAGEAL ECHOCARDIOGRAM (TEE) (N/A)   Subjective:  Luis Tucker has no complaints.  He states he is ready to go home.  Objective: Vital signs in last 24 hours: Temp:  [97.9 F (36.6 C)-98 F (36.7 C)] 97.9 F (36.6 C) (01/16 0503) Pulse Rate:  [81-94] 81 (01/16 0503) Cardiac Rhythm:  [-] Normal sinus rhythm (01/15 1930) Resp:  [18-20] 18 (01/16 0503) BP: (92-115)/(61-72) 115/72 mmHg (01/16 0503) SpO2:  [96 %-97 %] 97 % (01/16 0503) Weight:  [233 lb 11 oz (106 kg)] 233 lb 11 oz (106 kg) (01/16 0503)  Intake/Output from previous day: 01/15 0701 - 01/16 0700 In: 720 [P.O.:720] Out: -   General appearance: alert, cooperative and no distress Heart: regular rate and rhythm Lungs: clear to auscultation bilaterally Abdomen: soft, non-tender; bowel sounds normal; no masses,  no organomegaly Extremities: edema none Wound: clean and dry  Lab Results:  Recent Labs  01/07/16 0410  WBC 10.8*  HGB 8.9*  HCT 27.8*  PLT 120*   BMET:  Recent Labs  01/07/16 0410  NA 138  K 3.9  CL 102  CO2 29  GLUCOSE 99  BUN 13  CREATININE 1.10  CALCIUM 8.3*    PT/INR: No results for input(s): LABPROT, INR in the last 72 hours. ABG    Component Value Date/Time   PHART 7.316* 01/04/2016 1914   HCO3 25.4* 01/04/2016 1914   TCO2 27 01/05/2016 1659   ACIDBASEDEF 1.0 01/04/2016 1914   O2SAT 97.0 01/04/2016 1914   CBG (last 3)   Recent Labs  01/06/16 0828  GLUCAP 121*    Assessment/Plan: S/P Procedure(s) (LRB): CORONARY ARTERY BYPASS GRAFTING (CABG) x two using left internal mammary artery and left radial artery  (N/A) RADIAL ARTERY HARVEST (Left) TRANSESOPHAGEAL ECHOCARDIOGRAM (TEE) (N/A)  1. CV-  hemodynamically stable- continue Imdur for radial graft, Coreg- H/O stent will discuss need for effient with staff 2. Pulm- no acute issues, continue IS at discharge 3. Renal- weight is at baseline, taper Lasix 4. Dispo- patient is stable, will plan to d/c home today   LOS: 11 days    Telia Amundson 01/09/2016

## 2016-01-18 DIAGNOSIS — Z736 Limitation of activities due to disability: Secondary | ICD-10-CM

## 2016-01-24 ENCOUNTER — Telehealth: Payer: Self-pay | Admitting: Internal Medicine

## 2016-01-24 ENCOUNTER — Ambulatory Visit (INDEPENDENT_AMBULATORY_CARE_PROVIDER_SITE_OTHER): Payer: Managed Care, Other (non HMO) | Admitting: Internal Medicine

## 2016-01-24 ENCOUNTER — Encounter: Payer: Self-pay | Admitting: Internal Medicine

## 2016-01-24 VITALS — BP 122/80 | HR 105 | Ht 71.0 in | Wt 225.4 lb

## 2016-01-24 DIAGNOSIS — I2510 Atherosclerotic heart disease of native coronary artery without angina pectoris: Secondary | ICD-10-CM

## 2016-01-24 DIAGNOSIS — Z9861 Coronary angioplasty status: Secondary | ICD-10-CM | POA: Diagnosis not present

## 2016-01-24 DIAGNOSIS — Z951 Presence of aortocoronary bypass graft: Secondary | ICD-10-CM | POA: Diagnosis not present

## 2016-01-24 DIAGNOSIS — E785 Hyperlipidemia, unspecified: Secondary | ICD-10-CM

## 2016-01-24 DIAGNOSIS — I10 Essential (primary) hypertension: Secondary | ICD-10-CM

## 2016-01-24 NOTE — Telephone Encounter (Signed)
Had called patient on 01/23/16 that we had received [employer] Short Term Disability Forms.  He brought Check and signed AUTH on 01/24/16 after his appointment with Dr Rennis Golden today.  Sent [employer] Forms, Check and signed AUTH to [employer] via [employer] on 01/24/16. lp

## 2016-01-24 NOTE — Patient Instructions (Signed)
Dr Hilty recommends that you schedule a follow-up appointment in 3 months.  If you need a refill on your cardiac medications before your next appointment, please call your pharmacy. 

## 2016-01-24 NOTE — Progress Notes (Signed)
OFFICE NOTE  Chief Complaint:  Hospital follow-up  Primary Care Physician: MANNING, Adelene Amas., MD  HPI:  Luis Tucker is a pleasant 65 year old male with a history of coronary artery disease. He had a stent to the circumflex in 2008. 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 He also has hypertension dyslipidemia and metabolic syndrome. Is not very physically active as been unable to lose weight recently. 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 He denies any chest pain worsening shortness of breath over the past year and recently had laboratory work provided by Dr. Kathrynn Running at [facility] care which showed a well-controlled lipid profile.  He presented last week to [facility] with worsening chest pain concerning for angina.   His EKG showed SR, J point elevation and inferolateral T waves different from previous ECGs. He was admitted for further evaluation and placed on IV heparin. His home triamterene/HCTZ and losartan were held on admission due to Cr 1.29. Upon further review it appears prior Cr in 2011 was 1.46 so he likely has CKD stage III. Troponins remained negative. LDL 69. Had hypokalemia which required repletion (likely r/t prior HCTZ use). He underwent cardiac cath showing severe ostial LCx disease s/p angioplasty/Synergy stent placement with moderate prox LAD stenosis. LAD disease did not appear to be different than recent path. Patent mid LCx stent. Normal EF and LVEDP. DAPT for at least 1 year recommended - started on Effient.    Luis Tucker returns for follow-up. He was recently seen by Norma Fredrickson, NP, who felt that he was doing well. No medication changes were made. He subsequently has seen his primary care provider and was advised that he needed at DOT physical. He denies any further angina or shortness of breath.  Luis Tucker is seen today in hospital follow-up. He reports feeling fairly well  although noted his heart rate is been elevated. We went through his extensive medication list and it seems like he is taking several medicines that he probably should not be on. He was taking aspirin 325 and 81 mg as well as Effient. He's also taking HCTZ and Dyazide. In addition he still on indoor which is likely not necessary post bypass. He reports some soreness to his chest wall and numbness but he denies any cardiac chest pain. He does do some walking and gets some mild shortness of breath. EKG shows sinus tachycardia 105.  PMHx:  Past Medical History  Diagnosis Date  . Hypertension   . Dyslipidemia   . Metabolic syndrome   . History of nuclear stress test 2014    exercise myoview; perfusion defect consistent with infarct/scar (basal/mid/apical inferior regions); low risk scan   . GERD (gastroesophageal reflux disease)   . Hepatitis     "noncontagious type"  . Pre-diabetes   . CKD (chronic kidney disease), stage III   . Kidney stones   . Arthritis     "little in my knees" (12/29/2015)  . CAD (coronary artery disease)     a. h/o stent to Cx 2008. b. cath 03/2009 demonstrated patent stent to circumflex, 60% LAD stenosis. c. [place] 06/2015: severe ostial LCx disease s/p angioplasty/Synergy stent placement. Moderate prox LAD disease, unchanged from prior, patent LCx stent, normal EF.  Marland Kitchen History of non-ST elevation myocardial infarction (NSTEMI) 03/2007    4.0 x 15 mm Vision BMS to mCFX  . S/P CABG x 2 01/04/2016  LIMA to LAD, LRA to OM2    Past Surgical History  Procedure Laterality Date  . Transthoracic echocardiogram  04/2007    EF >55%; mild concentric LVH; borderline RV enlargement; RA & LA mildly dilated; mild MR, mild TR, mild PV regurg  . Knee arthroscopy Left 2004; 2011  . Lymph node dissection Right 1973    "groin"  . Coronary angioplasty with stent placement  03/2007    4.0 x 15-mm Vision BMS mCFX; 60% residual LAD  . Cardiac catheterization  2010    [place], LAD 60%, CFX  60-70%, stent OK, RCA OK, EF 60%  . Cardiac catheterization N/A 07/20/2015    Procedure: Left Heart Cath and Coronary Angiography;  Surgeon: Iran Ouch, MD;  Location: [facility] INVASIVE CV LAB;  Service: Cardiovascular;  Laterality: N/A;  . Cardiac catheterization N/A 07/20/2015    Procedure: Coronary Stent Intervention;  Surgeon: Iran Ouch, MD;  Location: [facility] INVASIVE CV LAB;  Service: Cardiovascular;  Laterality: N/A;  . Cardiac catheterization  12/29/2015  . Cardiac catheterization N/A 12/29/2015    Procedure: Left Heart Cath and Coronary Angiography;  Surgeon: Runell Gess, MD;  Location: [facility] INVASIVE CV LAB;  Service: Cardiovascular;  Laterality: N/A;  . Coronary artery bypass graft N/A 01/04/2016    Procedure: CORONARY ARTERY BYPASS GRAFTING (CABG) x two using left internal mammary artery and left radial artery ;  Surgeon: Purcell Nails, MD;  Location: [facility] OR;  Service: Open Heart Surgery;  Laterality: N/A;  . Radial artery harvest Left 01/04/2016    Procedure: RADIAL ARTERY HARVEST;  Surgeon: Purcell Nails, MD;  Location[facility] OR;  Service: Open Heart Surgery;  Laterality: Left;  . Tee without cardioversion N/A 01/04/2016    Procedure: TRANSESOPHAGEAL ECHOCARDIOGRAM (TEE);  Surgeon: Purcell Nails, MD;  Location: [facility] OR;  Service: Open Heart Surgery;  Laterality: N/A;    FAMHx:  Family History  Problem Relation Age of Onset  . Cancer - Prostate Father     SOCHx:   reports that he quit smoking about 8 years ago. His smoking use included Cigarettes. He has a 35 pack-year smoking history. He has never used smokeless tobacco. He reports that he drinks alcohol. He reports that he uses illicit drugs (Marijuana).  ALLERGIES:  No Known Allergies  ROS: A comprehensive review of systems was negative except for: Respiratory: positive for dyspnea on exertion Musculoskeletal: positive for Chest wall numbness  HOME MEDS: Current Outpatient Prescriptions  Medication Sig Dispense Refill  .  acetaminophen (TYLENOL) 500 MG tablet Take 2 tablets (1,000 mg total) by mouth every 6 (six) hours as needed. 30 tablet 0  . aspirin EC 325 MG EC tablet Take 1 tablet (325 mg total) by mouth daily. 30 tablet 12  . atorvastatin (LIPITOR) 80 MG tablet Take 80 mg by mouth daily.    . carvedilol (COREG) 12.5 MG tablet Take 12.5 mg by mouth 2 (two) times daily with a meal.    . hydrochlorothiazide (MICROZIDE) 12.5 MG capsule Take 2 capsules (25 mg total) by mouth daily. 60 capsule 3  . isosorbide mononitrate (IMDUR) 30 MG 24 hr tablet Take 0.5 tablets (15 mg total) by mouth daily. 15 tablet 11  . pantoprazole (PROTONIX) 40 MG tablet Take 1 tablet (40 mg total) by mouth daily as needed (acid reflux). 30 tablet 11  . traMADol (ULTRAM) 50 MG tablet Take 1-2 tablets (50-100 mg total) by mouth every 4 (four) hours as needed for moderate pain. 30 tablet  0   No current facility-administered medications for this visit.    LABS/IMAGING: No results found for this or any previous visit (from the past 48 hour(s)). No results found.  VITALS: There were no vitals taken for this visit.  EXAM: General appearance: alert and no distress Neck: no adenopathy, no carotid bruit, no JVD, supple, symmetrical, trachea midline and thyroid not enlarged, symmetric, no tenderness/mass/nodules Lungs: clear to auscultation bilaterally Heart: regular rate and rhythm, S1, S2 normal, no murmur, click, rub or gallop and Healing midline sternal incision Abdomen: soft, non-tender; bowel sounds normal; no masses,  no organomegaly Extremities: extremities normal, atraumatic, no cyanosis or edema and Healing left radial artery harvest incision in the left forearm Pulses: 2+ and symmetric Skin: Skin color, texture, turgor normal. No rashes or lesions Neurologic: Grossly normal  EKG: Sinus tachycardia 105  ASSESSMENT: 1. Coronary artery disease status post PCI to the circumflex in 2008 - and PCI to the ostial left circumflex  with a synergy DES (2016) 2. ISR with CABG x 2 with LIMA to LAD and free radial graft to OM2 (12/2015) 3. Metabolic syndrome 4. Hypertension 5. Hyperlipidemia  PLAN: 1.   Mr. Pallas unfortunately had progression of his coronary artery disease and appropriately was treated with coronary artery bypass grafting. He underwent 2 arterial grafts to the LAD and OM 2. He's had no further cardiac chest pain since that time. He seems to be healing well from his incisions. Unfortunately there is some medication confusion which we cleared up today. For the time being he is on aspirin and Effient. I think he can likely stop the Effient however I will for that to Dr. Cornelius Moras. He did not have acute coronary syndrome prior to this event and its more than a year since his synergy drug-eluting stent. He does not need to be on both high and low dose aspirin. We'll keep him on low-dose aspirin for the time being. I discontinued indoor and hydrochlorothiazide. I suspect he may be a little dehydrated explaining his tachycardia. Plan to see him back in 3 months for further close observation.  Chrystie Nose, MD, [facility] Attending Cardiologist [facility]  Lisette Abu Arash Karstens 01/24/2016, 8:12 AM

## 2016-01-25 ENCOUNTER — Other Ambulatory Visit: Payer: Self-pay | Admitting: Internal Medicine

## 2016-01-25 DIAGNOSIS — Z951 Presence of aortocoronary bypass graft: Secondary | ICD-10-CM

## 2016-01-25 MED ORDER — ASPIRIN EC 325 MG PO TBEC: 325.0000 mg | DELAYED_RELEASE_TABLET | Freq: Every day | ORAL | Status: DC

## 2016-01-25 NOTE — Progress Notes (Signed)
Thanks. Will have him take full dose aspirin and stop Effient.    -[place]

## 2016-01-31 ENCOUNTER — Telehealth: Payer: Self-pay | Admitting: Internal Medicine

## 2016-01-31 NOTE — Telephone Encounter (Signed)
Received [employer] Short Term Disability Forms back from [employer] @ [address] [facility] for Dr Rennis Golden to review, complete and sign.  Forms given to [facility] for Dr Rennis Golden. lp

## 2016-02-03 ENCOUNTER — Other Ambulatory Visit: Payer: Self-pay | Admitting: Thoracic Surgery (Cardiothoracic Vascular Surgery)

## 2016-02-03 DIAGNOSIS — Z951 Presence of aortocoronary bypass graft: Secondary | ICD-10-CM

## 2016-02-06 ENCOUNTER — Ambulatory Visit (INDEPENDENT_AMBULATORY_CARE_PROVIDER_SITE_OTHER): Payer: Self-pay | Admitting: Thoracic Surgery (Cardiothoracic Vascular Surgery)

## 2016-02-06 ENCOUNTER — Encounter: Payer: Self-pay | Admitting: Thoracic Surgery (Cardiothoracic Vascular Surgery)

## 2016-02-06 ENCOUNTER — Ambulatory Visit
Admission: RE | Admit: 2016-02-06 | Discharge: 2016-02-06 | Disposition: A | Discharge status: Home / Self Care | Payer: 59 | Source: Ambulatory Visit | Attending: Thoracic Surgery (Cardiothoracic Vascular Surgery) | Admitting: Thoracic Surgery (Cardiothoracic Vascular Surgery)

## 2016-02-06 VITALS — BP 114/80 | HR 74 | Resp 20 | Ht 71.0 in | Wt 225.0 lb

## 2016-02-06 DIAGNOSIS — Z951 Presence of aortocoronary bypass graft: Secondary | ICD-10-CM

## 2016-02-06 NOTE — Patient Instructions (Signed)
Continue to avoid any heavy lifting or strenuous use of your arms or shoulders for at least a total of three months from the time of surgery.  After three months you may gradually increase how much you lift or otherwise use your arms or chest as tolerated, with limits based upon whether or not activities lead to the return of significant discomfort.  The patient is encouraged to enroll and participate in the outpatient cardiac rehab program beginning as soon as practical.  Continue all previous medications without any changes at this time  Make every effort to stay physically active, get some type of exercise on a regular basis, and stick to a "heart healthy diet".  The long term benefits for regular exercise and a healthy diet are critically important to your overall health and wellbeing.

## 2016-02-06 NOTE — Progress Notes (Signed)
[address]       [place] [address]             [phone]     CARDIOTHORACIC SURGERY OFFICE NOTE  Referring Provider is Runell Gess, MD  Primary Cardiologist is Rennis Golden Lisette Abu, MD PCP is MANNING, Adelene Amas., MD   HPI:  Patient returns to the office today for routine follow-up status post coronary artery bypass grafting 2 on 01/04/2016 for severe multivessel coronary artery disease with unstable angina pectoris.  His postoperative recovery in the hospital was uncomplicated and he was discharged home on the fifth postoperative day. Since hospital discharge she has continued to do well clinically.  He was seen in follow-up by Dr. Rennis Golden on 01/24/2016. At that time the patient was somewhat confused regarding which medications he was supposed to be taking. This was clarified by Dr. Rennis Golden. The patient has continued to recover uneventfully and he returns to our office for routine follow-up today. He has very mild residual soreness in his chest. He is no longer taking any sort of pain relievers. His appetite is good. He is sleeping well at night. He denies any symptoms of exertional chest discomfort or shortness of breath. He is walking every day. He has not started the cardiac rehabilitation program.   Current Outpatient Prescriptions  Medication Sig Dispense Refill  . acetaminophen (TYLENOL) 500 MG tablet Take 2 tablets (1,000 mg total) by mouth every 6 (six) hours as needed. 30 tablet 0  . aspirin EC 325 MG tablet Take 1 tablet (325 mg total) by mouth daily. 90 tablet 3  . atorvastatin (LIPITOR) 80 MG tablet Take 80 mg by mouth daily.    . carvedilol (COREG) 12.5 MG tablet Take 12.5 mg by mouth 2 (two) times daily with a meal.    . losartan (COZAAR) 25 MG tablet Take 25 mg by mouth daily.    . pantoprazole (PROTONIX) 40 MG tablet Take 1 tablet (40 mg total) by mouth daily as needed (acid reflux). 30 tablet 11  . triamterene-hydrochlorothiazide (DYAZIDE) 37.5-25  MG capsule Take 1 capsule by mouth daily.  0  . nitroGLYCERIN (NITROSTAT) 0.4 MG SL tablet Place 1 tablet under the tongue as needed. Reported on 02/06/2016  3   No current facility-administered medications for this visit.      Physical Exam:   BP 114/80 mmHg  Pulse 74  Resp 20  Ht  (1.803 m)  Wt 225 lb (102.059 kg)  BMI 31.39 kg/m2  SpO2 98%  General:  Well-appearing  Chest:   Clear to auscultation  CV:   Regular rate and rhythm without murmur  Incisions:  Clean and dry and healing nicely, sternum is stable. Circulation the left hand intact  Abdomen:  Soft and nontender  Extremities:  Warm and well-perfused  Diagnostic Tests:  CHEST 2 VIEW  COMPARISON: January 07, 2016  FINDINGS: The heart size and mediastinal contours are stable. There is no focal infiltrate, pulmonary edema, or pleural effusion. Minimal atelectasis of left lung base is noted. The visualized skeletal structures are stable.  IMPRESSION: No active cardiopulmonary disease.   Electronically Signed  By: Sherian Rein M.D.  On: 02/06/2016 14:32   Impression:  Patient is doing very well approximately one month status post coronary artery bypass grafting.   Plan:  We have not recommended any changes to the patient's current medications today. I have encouraged the patient to enroll and participate in the outpatient cardiac rehabilitation program.  I have encouraged the patient to continue to gradually increase his physical activity as tolerated with his primary limitation remaining that he refrain from heavy lifting or strenuous use of his arms or shoulders for at least another 2 months. By mid April he should be able to return to unrestricted physical activity.  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  The patient  will continue to follow-up intermittently with Dr. Rennis Golden. He will return to our office for routine follow-up next January, approximately 1 year following his surgery. He will call and return sooner should specific problems or difficulties arise.   Salvatore Decent. Cornelius Moras, MD 02/06/2016 2:02 PM

## 2016-02-09 ENCOUNTER — Other Ambulatory Visit: Payer: Self-pay | Admitting: *Deleted

## 2016-02-09 ENCOUNTER — Telehealth (HOSPITAL_COMMUNITY): Payer: Self-pay | Admitting: *Deleted

## 2016-02-09 MED ORDER — ATORVASTATIN CALCIUM 80 MG PO TABS: 80.0000 mg | ORAL_TABLET | Freq: Every day | ORAL | Status: DC

## 2016-02-09 NOTE — Telephone Encounter (Signed)
Received signed MD order - Dr. Rennis Golden and ok to start CR from the Dr. Cornelius Moras.  Contacted pt.  Pt concerned about the cost of the program.  Pt given cpt code to check with [employer] regarding benefits.  Pt to call back after he check with the insurance company. Contact phone number information provided to pt. Alanson Aly, BSN

## 2016-02-13 ENCOUNTER — Telehealth: Payer: Self-pay | Admitting: Internal Medicine

## 2016-02-13 NOTE — Telephone Encounter (Signed)
Received Signed [employer] Short Term Disability Claim Forms back from Dr Rennis Golden.,  Left Messages for patient to call our office.  Spoke with patient today and he wanted faxed to [facility] and mailed to him.  Faxed 02/13/16 and mailed 02/13/16. lp

## 2016-03-08 ENCOUNTER — Telehealth (HOSPITAL_COMMUNITY): Payer: Self-pay | Admitting: *Deleted

## 2016-03-08 NOTE — Telephone Encounter (Signed)
Follow up phone call for pt.  Checking back with pt to see if he had checked with [employer] for his insurance benefits.  Message left to please contact for sign up. Alanson Alyarlette Carlton RN, BSN

## 2016-04-02 ENCOUNTER — Ambulatory Visit (INDEPENDENT_AMBULATORY_CARE_PROVIDER_SITE_OTHER): Payer: 59 | Admitting: Internal Medicine

## 2016-04-02 ENCOUNTER — Ambulatory Visit (HOSPITAL_COMMUNITY)
Admission: RE | Admit: 2016-04-02 | Discharge: 2016-04-02 | Disposition: A | Discharge status: Home / Self Care | Payer: 59 | Source: Ambulatory Visit | Attending: Cardiology | Admitting: Cardiology

## 2016-04-02 ENCOUNTER — Encounter: Payer: Self-pay | Admitting: Internal Medicine

## 2016-04-02 VITALS — BP 128/82 | HR 63 | Ht 72.0 in | Wt 249.0 lb

## 2016-04-02 DIAGNOSIS — I119 Hypertensive heart disease without heart failure: Secondary | ICD-10-CM | POA: Insufficient documentation

## 2016-04-02 DIAGNOSIS — E669 Obesity, unspecified: Secondary | ICD-10-CM | POA: Insufficient documentation

## 2016-04-02 DIAGNOSIS — Z0289 Encounter for other administrative examinations: Secondary | ICD-10-CM

## 2016-04-02 DIAGNOSIS — Z6833 Body mass index (BMI) 33.0-33.9, adult: Secondary | ICD-10-CM | POA: Insufficient documentation

## 2016-04-02 DIAGNOSIS — Z951 Presence of aortocoronary bypass graft: Secondary | ICD-10-CM

## 2016-04-02 DIAGNOSIS — E785 Hyperlipidemia, unspecified: Secondary | ICD-10-CM

## 2016-04-02 DIAGNOSIS — Z024 Encounter for examination for driving license: Secondary | ICD-10-CM

## 2016-04-02 DIAGNOSIS — I10 Essential (primary) hypertension: Secondary | ICD-10-CM | POA: Diagnosis not present

## 2016-04-02 DIAGNOSIS — Z01810 Encounter for preprocedural cardiovascular examination: Secondary | ICD-10-CM | POA: Insufficient documentation

## 2016-04-02 DIAGNOSIS — Z9861 Coronary angioplasty status: Secondary | ICD-10-CM | POA: Diagnosis not present

## 2016-04-02 DIAGNOSIS — Z87891 Personal history of nicotine dependence: Secondary | ICD-10-CM | POA: Diagnosis not present

## 2016-04-02 DIAGNOSIS — I2510 Atherosclerotic heart disease of native coronary artery without angina pectoris: Secondary | ICD-10-CM | POA: Diagnosis not present

## 2016-04-02 LAB — ECHOCARDIOGRAM COMPLETE
HEIGHTINCHES: 72 in
WEIGHTICAEL: 3984 [oz_av]

## 2016-04-02 NOTE — Progress Notes (Signed)
OFFICE NOTE  Chief Complaint:  Routine follow-up, DOT clearance  Primary Care Physician: Arlan Organ., MD  HPI:  Luis Tucker is a pleasant 65 year old male with a history of coronary artery disease. He had a stent to the circumflex in 2008. 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 He also has hypertension dyslipidemia and metabolic syndrome. Is not very physically active as been unable to lose weight recently. 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 He denies any chest pain worsening shortness of breath over the past year and recently had laboratory work provided by Dr. Kathrynn Running at [facility] care which showed a well-controlled lipid profile.  He presented last week to [facility] with worsening chest pain concerning for angina.   His EKG showed SR, J point elevation and inferolateral T waves different from previous ECGs. He was admitted for further evaluation and placed on IV heparin. His home triamterene/HCTZ and losartan were held on admission due to Cr 1.29. Upon further review it appears prior Cr in 2011 was 1.46 so he likely has CKD stage III. Troponins remained negative. LDL 69. Had hypokalemia which required repletion (likely r/t prior HCTZ use). He underwent cardiac cath showing severe ostial LCx disease s/p angioplasty/Synergy stent placement with moderate prox LAD stenosis. LAD disease did not appear to be different than recent path. Patent mid LCx stent. Normal EF and LVEDP. DAPT for at least 1 year recommended - started on Effient.    Luis Tucker returns for follow-up. He was recently seen by Norma Fredrickson, NP, who felt that he was doing well. No medication changes were made. He subsequently has seen his primary care provider and was advised that he needed at DOT physical. He denies any further angina or shortness of breath.  Luis Tucker is seen today in hospital follow-up. He reports feeling  fairly well although noted his heart rate is been elevated. We went through his extensive medication list and it seems like he is taking several medicines that he probably should not be on. He was taking aspirin 325 and 81 mg as well as Effient. He's also taking HCTZ and Dyazide. In addition he still on indoor which is likely not necessary post bypass. He reports some soreness to his chest wall and numbness but he denies any cardiac chest pain. He does do some walking and gets some mild shortness of breath. EKG shows sinus tachycardia 105.  Luis Tucker returns today for follow-up. He was last seen 3 months ago. We had to correct some medication irregularities. He since has been taking his medications as we prescribed. He denies any chest pain or worsening shortness of breath. He is unfortunately gained some weight due to inactivity. He is very interested in going back to work. His surgeon Dr. Cornelius Moras feels that it's okay for him to go back from a surgical standpoint as of tomorrow, 04/03/2016. His DOT evaluator is recommending either stress test or echocardiography and as his perioperative TEE indicated an EF of 50%, I would like to repeat echocardiogram to see if LV function has improved. From a clinical standpoint he's doing very well.  PMHx:  Past Medical History  Diagnosis Date  . Hypertension   . Dyslipidemia   . Metabolic syndrome   . History of nuclear stress test 2014    exercise myoview; perfusion defect consistent with infarct/scar (basal/mid/apical inferior regions); low risk scan   .  GERD (gastroesophageal reflux disease)   . Hepatitis     "noncontagious type"  . Pre-diabetes   . CKD (chronic kidney disease), stage III   . Kidney stones   . Arthritis     "little in my knees" (12/29/2015)  . CAD (coronary artery disease)     a. h/o stent to Cx 2008. b. cath 03/2009 demonstrated patent stent to circumflex, 60% LAD stenosis. c. [place] 06/2015: severe ostial LCx disease s/p angioplasty/Synergy stent  placement. Moderate prox LAD disease, unchanged from prior, patent LCx stent, normal EF.  Marland Kitchen History of non-ST elevation myocardial infarction (NSTEMI) 03/2007    4.0 x 15 mm Vision BMS to mCFX  . 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 Transthoracic echocardiogram  04/2007    EF >55%; mild concentric LVH; borderline RV enlargement; RA & LA mildly dilated; mild MR, mild TR, mild PV regurg  . Knee arthroscopy Left 2004; 2011  . Lymph node dissection Right 1973    "groin"  . Coronary angioplasty with stent placement  03/2007    4.0 x 15-mm Vision BMS mCFX; 60% residual LAD  . Cardiac catheterization  2010    [place], LAD 60%, CFX 60-70%, stent OK, RCA OK, EF 60%  . Cardiac catheterization N/A 07/20/2015    Procedure: Left Heart Cath and Coronary Angiography;  Surgeon: Iran Ouch, MD;  Location: [facility] INVASIVE CV LAB;  Service: Cardiovascular;  Laterality: N/A;  . Cardiac catheterization N/A 07/20/2015    Procedure: Coronary Stent Intervention;  Surgeon: Iran Ouch, MD;  Location: [facility] INVASIVE CV LAB;  Service: Cardiovascular;  Laterality: N/A;  . Cardiac catheterization  12/29/2015  . Cardiac catheterization N/A 12/29/2015    Procedure: Left Heart Cath and Coronary Angiography;  Surgeon: Runell Gess, MD;  [facility] INVASIVE CV LAB;  Service: Cardiovascular;  Laterality: N/A;  . Coronary artery bypass graft N/A 01/04/2016    Procedure: CORONARY ARTERY BYPASS GRAFTING (CABG) x two using left internal mammary artery and left radial artery ;  Surgeon: Purcell Nails, MD;  Location: [facility] OR;  Service: Open Heart Surgery;  Laterality: N/A;  . Radial artery harvest Left 01/04/2016    Procedure: RADIAL ARTERY HARVEST;  Surgeon: Purcell Nails, MD;  Location: [facility] OR;  Service: Open Heart Surgery;  Laterality: Left;  . Tee without cardioversion N/A 01/04/2016    Procedure: TRANSESOPHAGEAL ECHOCARDIOGRAM (TEE);  Surgeon: Purcell Nails, MD;  Location[facility] OR;  Service: Open Heart Surgery;  Laterality: N/A;    FAMHx:  Family History  Problem Relation Age of Onset  . Cancer - Prostate Father     SOCHx:   reports that he quit smoking about 9 years ago. His smoking use included Cigarettes. He has a 35 pack-year smoking history. He has never used smokeless tobacco. He reports that he drinks alcohol. He reports that he uses illicit drugs (Marijuana).  ALLERGIES:  No Known Allergies  ROS: A comprehensive review of systems was negative except for: Respiratory: positive for dyspnea on exertion Musculoskeletal: positive for Chest wall numbness  HOME MEDS: Current Outpatient Prescriptions  Medication Sig Dispense Refill  . acetaminophen (TYLENOL) 500 MG tablet Take 2 tablets (1,000 mg total) by mouth every 6 (six) hours as needed. 30 tablet 0  . aspirin EC 325 MG tablet Take 1 tablet (325 mg total) by mouth daily. 90 tablet 3  .  atorvastatin (LIPITOR) 80 MG tablet Take 1 tablet (80 mg total) by mouth daily. 90 tablet 3  . carvedilol (COREG) 12.5 MG tablet Take 12.5 mg by mouth 2 (two) times daily with a meal.    . losartan (COZAAR) 25 MG tablet Take 25 mg by mouth daily.    . nitroGLYCERIN (NITROSTAT) 0.4 MG SL tablet Place 1 tablet under the tongue as needed. Reported on 02/06/2016  3  . pantoprazole (PROTONIX) 40 MG tablet Take 1 tablet (40 mg total) by mouth daily as needed (acid reflux). 30 tablet 11  . triamterene-hydrochlorothiazide (DYAZIDE) 37.5-25 MG capsule Take 1 capsule by mouth daily.  0   No current facility-administered medications for this visit.    LABS/IMAGING: No results found for this or any previous visit (from the past 48 hour(s)). No results found.  VITALS: BP 128/82 mmHg  Pulse 63  Ht 6' (1.829 m)  Wt 249 lb (112.946 kg)  BMI 33.76 kg/m2  EXAM: General appearance: alert and no distress Neck: no adenopathy, no carotid bruit, no JVD, supple, symmetrical, trachea midline and thyroid not  enlarged, symmetric, no tenderness/mass/nodules Lungs: clear to auscultation bilaterally Heart: regular rate and rhythm, S1, S2 normal, no murmur, click, rub or gallop and Healing midline sternal incision Abdomen: soft, non-tender; bowel sounds normal; no masses,  no organomegaly Extremities: extremities normal, atraumatic, no cyanosis or edema and Healing left radial artery harvest incision in the left forearm Pulses: 2+ and symmetric Skin: Skin color, texture, turgor normal. No rashes or lesions Neurologic: Grossly normal  EKG: Normal sinus rhythm at 63, nonspecific T wave changes  ASSESSMENT: 1. Coronary artery disease status post PCI to the circumflex in 2008 - and PCI to the ostial left circumflex with a synergy DES (2016) 2. ISR with CABG x 2 with LIMA to LAD and free radial graft to OM2 (12/2015) 3. Metabolic syndrome 4. Hypertension 5. Hyperlipidemia  PLAN: 1.   Luis Tucker is doing better every day from his bypass surgery. He denies any significant shortness of breath or chest pain. Blood pressure is now well controlled. Cholesterol was at goal. We will repeat an echocardiogram per DOT recommendations and to see if EF is improved from low normal 50% which was perioperatively. 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  Please don't hesitate to contact me with any questions.  Luis NoseKenneth C. Sade Hollon, MD, [facility] - ClarksvilleFACC Attending Cardiologist [facility]  Luis NoseKenneth C Kashauna Celmer 04/02/2016, 9:51 AM

## 2016-04-02 NOTE — Patient Instructions (Signed)
Your physician has requested that you have an echocardiogram @ [address]. Echocardiography is a painless test that uses sound waves to create images of your heart. It provides your doctor with information about the size and shape of your heart and how well your heart's chambers and valves are working. This procedure takes approximately one hour. There are no restrictions for this procedure.   Your physician wants you to follow-up in: 6 months with Dr. Hilty. You will receive a reminder letter in the mail two months in advance. If you don't receive a letter, please call our office to schedule the follow-up appointment.  

## 2016-04-09 ENCOUNTER — Ambulatory Visit: Payer: Managed Care, Other (non HMO) | Admitting: Internal Medicine

## 2016-05-29 ENCOUNTER — Other Ambulatory Visit: Payer: Self-pay

## 2016-05-29 MED ORDER — TRIAMTERENE-HCTZ 37.5-25 MG PO CAPS: 1.0000 | ORAL_CAPSULE | Freq: Every day | ORAL | Status: DC

## 2016-06-09 ENCOUNTER — Other Ambulatory Visit: Payer: Self-pay | Admitting: Internal Medicine

## 2016-06-11 NOTE — Telephone Encounter (Signed)
Rx(s) sent to pharmacy electronically.  

## 2016-06-28 ENCOUNTER — Encounter: Payer: Self-pay | Admitting: Physician Assistant

## 2016-06-28 ENCOUNTER — Ambulatory Visit (INDEPENDENT_AMBULATORY_CARE_PROVIDER_SITE_OTHER): Payer: 59 | Admitting: Physician Assistant

## 2016-06-28 VITALS — BP 112/78 | HR 83 | Ht 72.0 in | Wt 238.6 lb

## 2016-06-28 DIAGNOSIS — I2510 Atherosclerotic heart disease of native coronary artery without angina pectoris: Secondary | ICD-10-CM

## 2016-06-28 DIAGNOSIS — Z79899 Other long term (current) drug therapy: Secondary | ICD-10-CM

## 2016-06-28 DIAGNOSIS — Z951 Presence of aortocoronary bypass graft: Secondary | ICD-10-CM

## 2016-06-28 DIAGNOSIS — R609 Edema, unspecified: Secondary | ICD-10-CM

## 2016-06-28 DIAGNOSIS — R600 Localized edema: Secondary | ICD-10-CM

## 2016-06-28 DIAGNOSIS — I2583 Coronary atherosclerosis due to lipid rich plaque: Secondary | ICD-10-CM

## 2016-06-28 MED ORDER — FUROSEMIDE 20 MG PO TABS: ORAL_TABLET | ORAL | Status: DC

## 2016-06-28 NOTE — Progress Notes (Signed)
Cardiology Office Note   Date:  06/28/2016   ID:  Luis Tucker, DOB 07/22/1951, MRN [id number]  PCP:  Arlan OrganMANNING, JAMES S., MD  Cardiologist:  Dr Weston AnnaHilty   Iran Kievit, PA-C   Chief Complaint  Patient presents with  . 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 CABG 12/2015 w/ LIMA-LAD, L rad-OM2, EF nl  Luis Tucker presents for Evaluation of edema in both hands.  He has otherwise been doing well from a cardiac standpoint. He's had no chest pain and his breathing is fine.  On 07/01, he noticed swelling of both hands. His hands felt puffy and tight, but were not numb. It hurt to try to make a fist, because of the edema. He did not notice swelling in his legs, but a couple of days later, saw edema in his feet. He has been having problems with this left knee and is wearing a brace on it. He is also using a cane. He is as active as his knee will allow him to be, and was working, but activity has been limited. He does not feel that he is taking in any extra fluid or getting extra salt.  He went to his primary MDs office and was given 5 tablets of a fluid pill. He started taking this and has taken 4/5 pills. The medication is helped a great deal, and the edema in his hands is much improved. He no longer notices edema in his feet. His hand still feel a little bit tight. He is not having pain or numbness.   Past Medical History  Diagnosis Date  . Hypertension   . Dyslipidemia   . Metabolic syndrome   . History of nuclear stress test 2014    exercise myoview; perfusion defect consistent with infarct/scar (basal/mid/apical inferior regions); low risk scan   . GERD (gastroesophageal reflux disease)   . Hepatitis     "noncontagious type"  . Pre-diabetes   . CKD (chronic kidney disease), stage III   . Kidney stones   . Arthritis     "little  in my knees" (12/29/2015)  . CAD (coronary artery disease)     a. h/o stent to Cx 2008. b. cath 03/2009 demonstrated patent stent to circumflex, 60% LAD stenosis. c. [place] 06/2015: severe ostial LCx disease s/p angioplasty/Synergy stent placement. Moderate prox LAD disease, unchanged from prior, patent LCx stent, normal EF.  Marland Kitchen. History of non-ST elevation myocardial infarction (NSTEMI) 03/2007    4.0 x 15 mm Vision BMS to mCFX  . 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 Transthoracic echocardiogram  04/2007    EF >55%; mild concentric LVH; borderline RV enlargement; RA & LA mildly dilated; mild MR, mild TR, mild PV regurg  . Knee arthroscopy Left 2004; 2011  . Lymph node dissection Right 1973    "groin"  . Coronary angioplasty with stent placement  03/2007    4.0 x 15-mm Vision BMS mCFX; 60% residual LAD  . Cardiac catheterization  2010    [place], LAD 60%, CFX 60-70%, stent OK, RCA OK, EF 60%  . Cardiac catheterization N/A 07/20/2015    Procedure: Left Heart Cath and Coronary Angiography;  Surgeon: Chelsea AusMuhammad A  Kirke Corin, MD;  Location: [facility] INVASIVE [facility] LAB;  Service: Cardiovascular;  Laterality: N/A;  . Cardiac catheterization N/A 07/20/2015    Procedure: Coronary Stent Intervention;  Surgeon: Iran Ouch, MD;  Location: [facility];  Service: Cardiovascular;  Laterality: N/A;  . Cardiac catheterization N/A 12/29/2015    Procedure: Left Heart Cath and Coronary Angiography;  Surgeon: Runell Gess, MD;  Location: [facility] INVASIVE CV LAB;  Service: Cardiovascular;  Laterality: N/A;  . Coronary artery bypass graft N/A 01/04/2016    Procedure: CORONARY ARTERY BYPASS GRAFTING (CABG) x two using left internal mammary artery and left radial artery ;  Surgeon: Purcell Nails, MD;  Location: [facility] OR;  Service: Open Heart Surgery;  Laterality: N/A;  . Radial artery harvest Left 01/04/2016    Procedure: RADIAL ARTERY HARVEST;  Surgeon: Purcell Nails, MD;  Location: [facility] OR;  Service: Open Heart Surgery;  Laterality: Left;  . Tee without cardioversion N/A 01/04/2016    Procedure: TRANSESOPHAGEAL ECHOCARDIOGRAM (TEE);  Surgeon: Purcell Nails, MD;  Location: [facility] OR;  Service: Open Heart Surgery;  Laterality: N/A;    Current Outpatient Prescriptions  Medication Sig Dispense Refill  . acetaminophen (TYLENOL) 500 MG tablet Take 2 tablets (1,000 mg total) by mouth every 6 (six) hours as needed. 30 tablet 0  . aspirin EC 325 MG tablet Take 1 tablet (325 mg total) by mouth daily. 90 tablet 3  . atorvastatin (LIPITOR) 80 MG tablet Take 1 tablet (80 mg total) by mouth daily. 90 tablet 3  . carvedilol (COREG) 12.5 MG tablet Take 12.5 mg by mouth 2 (two) times daily with a meal.    . furosemide (LASIX) 20 MG tablet Take 1 tablet by mouth every morning.    Marland Kitchen losartan (COZAAR) 25 MG tablet TAKE 1 TABLET BY MOUTH EVERY DAY 30 tablet 8  . nitroGLYCERIN (NITROSTAT) 0.4 MG SL tablet Place 1 tablet under the tongue as needed. Reported on 02/06/2016  3  . pantoprazole (PROTONIX) 40 MG tablet Take 1 tablet (40 mg total) by mouth daily as needed (acid reflux). 30 tablet 11  . triamterene-hydrochlorothiazide (DYAZIDE) 37.5-25 MG capsule Take 1 each (1 capsule total) by mouth daily. 30 capsule 6   No current facility-administered medications for this visit.    Allergies:   Review of patient's allergies indicates no known allergies.    Social History:  The patient  reports that he quit smoking about 9 years ago. His smoking use included Cigarettes. He has a 35 pack-year smoking history. He has never used smokeless tobacco. He reports that he drinks alcohol. He reports that he uses illicit drugs (Marijuana).   Family History:  The patient's family history includes Cancer - Prostate in his father.    ROS:  Please see the history of present illness. All other systems are reviewed and negative.    PHYSICAL EXAM: VS:  BP 112/78 mmHg  Pulse 83  Ht 6' (1.829  m)  Wt 238 lb 9.6 oz (108.228 kg)  BMI 32.35 kg/m2 , BMI Body mass index is 32.35 kg/(m^2). GEN: Well nourished, well developed, male in no acute distress HEENT: normal for age  Neck: no JVD, no carotid bruit, no masses Cardiac: RRR; no murmur, no rubs, or gallops Respiratory:  clear to auscultation bilaterally, normal work of breathing GI: soft, nontender, nondistended, + BS MS: no deformity or atrophy; trace pedal and bilateral hand edema; distal pulses are 2+ in all 4 extremities (left ulnar pulse is 2+ as  the left radial was removed for his bypass surgery); capillary refill is within normal limits Skin: warm and dry, no rash, all surgical scars are well healed Neuro:  Strength and sensation are intact Psych: euthymic mood, full affect   EKG:  EKG is ordered today. The ekg ordered today demonstrates sinus rhythm with a nonspecific intraventricular conduction delay, QRS duration 122 ms, previous QRS duration nontender and 34 ms   Recent Labs: 12/20/2015: TSH 1.072 12/30/2015: ALT 32 01/05/2016: Magnesium 2.2 01/07/2016: BUN 13; Creatinine, Ser 1.10; Hemoglobin 8.9*; Platelets 120*; Potassium 3.9; Sodium 138    Lipid Panel    Component Value Date/Time   CHOL 114 12/30/2015 0907   TRIG 86 12/30/2015 0907   HDL 28* 12/30/2015 0907   CHOLHDL 4.1 12/30/2015 0907   VLDL 17 12/30/2015 0907   LDLCALC 69 12/30/2015 0907     Wt Readings from Last 3 Encounters:  06/28/16 238 lb 9.6 oz (108.228 kg)  04/02/16 249 lb (112.946 kg)  02/06/16 225 lb (102.059 kg)     Other studies Reviewed: Additional studies/ records that were reviewed today include: Office notes, hospital records and testing   ASSESSMENT AND PLAN:  1.  Hand Edema: He has mild volume overload in his hands. He has minimal excess fluid in his legs. His respiratory status is at baseline. He has been on Dyazide for a couple of years and has done well with that until recently. I do not have any evidence of venous  insufficiency or aortic insufficiency. Capillary refill is within normal limits, indicating good distal circulation.   I feel he has mild volume overload, and we'll change his diuretic from Dyazide to Lasix 20 mg daily with the option to take an extra 20 mg as needed for edema. Since he has been taking additional diuretics for the last 4 days, we will check a BMET to make sure his electrolytes and kidney function are stable.   2. CAD: He is stable from a cardiac standpoint. He is on good medical therapy with aspirin, Lipitor, carvedilol, Cozaar and sublingual nitroglycerin. Continue current therapies.  3. Dyslipidemia: His last lipid profile was in January. That time, he had a low HDL and his LFTs were within normal limits. Recheck    Current medicines are reviewed at length with the patient today.  The patient does not have concerns regarding medicines.  The following changes have been made:  DC Dyazide, add Lasix   Labs/ tests ordered today include:   Orders Placed This Encounter  Procedures  . Basic metabolic panel  . EKG 12-Lead     Disposition:   FU with Dr. Rennis GoldenHilty   Signed, Theodore DemarkBarrett, Aniaya Bacha, PA-C  06/28/2016 3:54 PM     [facility] Phone: [phone]; Fax: [phone]  This note was written with the assistance of speech recognition software. Please excuse any transcriptional errors.

## 2016-06-28 NOTE — Patient Instructions (Addendum)
Your physician has recommended you make the following change in your medication...  1. STOP triameterene-hydrchlorothaizide 2. TAKE lasix 20mg  once daily - you can take an extra tablet as needed for swelling (an updated prescription has been sent to the pharmacy)  Your physician recommends that you return for lab work TODAY - BMET  Your physician recommends that you schedule a follow-up appointment with Dr. Rennis GoldenHilty (first available)

## 2016-07-02 ENCOUNTER — Telehealth: Payer: Self-pay | Admitting: Internal Medicine

## 2016-07-02 LAB — BASIC METABOLIC PANEL
BUN: 20 mg/dL (ref 7–25)
CHLORIDE: 100 mmol/L (ref 98–110)
CO2: 23 mmol/L (ref 20–31)
Calcium: 9.2 mg/dL (ref 8.6–10.3)
Creat: 1.16 mg/dL (ref 0.70–1.25)
Glucose, Bld: 80 mg/dL (ref 65–99)
POTASSIUM: 4.1 mmol/L (ref 3.5–5.3)
SODIUM: 140 mmol/L (ref 135–146)

## 2016-07-02 NOTE — Telephone Encounter (Signed)
Creatinine resulted on 7/6 of 1.96 Received call from [facility] that this is being revised to 1.16 d/t issues with instruments etc.  A report will follow

## 2016-08-02 ENCOUNTER — Ambulatory Visit: Payer: 59 | Admitting: Internal Medicine

## 2016-08-28 ENCOUNTER — Ambulatory Visit: Payer: 59 | Admitting: Internal Medicine

## 2016-09-28 ENCOUNTER — Encounter: Payer: Self-pay | Admitting: Internal Medicine

## 2016-09-28 ENCOUNTER — Ambulatory Visit (INDEPENDENT_AMBULATORY_CARE_PROVIDER_SITE_OTHER): Payer: 59 | Admitting: Internal Medicine

## 2016-09-28 VITALS — BP 135/79 | HR 66 | Ht 72.0 in | Wt 249.4 lb

## 2016-09-28 DIAGNOSIS — E785 Hyperlipidemia, unspecified: Secondary | ICD-10-CM | POA: Diagnosis not present

## 2016-09-28 DIAGNOSIS — I10 Essential (primary) hypertension: Secondary | ICD-10-CM | POA: Diagnosis not present

## 2016-09-28 DIAGNOSIS — I2510 Atherosclerotic heart disease of native coronary artery without angina pectoris: Secondary | ICD-10-CM

## 2016-09-28 DIAGNOSIS — Z9861 Coronary angioplasty status: Secondary | ICD-10-CM

## 2016-09-28 DIAGNOSIS — Z951 Presence of aortocoronary bypass graft: Secondary | ICD-10-CM | POA: Diagnosis not present

## 2016-09-28 MED ORDER — VARDENAFIL HCL 10 MG PO TABS
10.0000 mg | ORAL_TABLET | Freq: Every day | ORAL | 0 refills | Status: DC | PRN
Start: 2016-09-28 — End: 2018-11-14

## 2016-09-28 MED ORDER — TRIAMTERENE-HCTZ 37.5-25 MG PO CAPS: 1.0000 | ORAL_CAPSULE | Freq: Every day | ORAL | 6 refills | Status: DC

## 2016-09-28 NOTE — Progress Notes (Signed)
OFFICE NOTE  Chief Complaint:  Routine follow-up  Primary Care Physician: Arlan Organ., MD  HPI:  Luis Tucker is a pleasant 65 year old male with a history of coronary artery disease. He had a stent to the circumflex in 2008. 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 He also has hypertension dyslipidemia and metabolic syndrome. Is not very physically active as been unable to lose weight recently. 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 He denies any chest pain worsening shortness of breath over the past year and recently had laboratory work provided by Dr. Kathrynn Running at [facility] which showed a well-controlled lipid profile.  He presented last week to [facility] with worsening chest pain concerning for angina.   His EKG showed SR, J point elevation and inferolateral T waves different from previous ECGs. He was admitted for further evaluation and placed on IV heparin. His home triamterene/HCTZ and losartan were held on admission due to Cr 1.29. Upon further review it appears prior Cr in 2011 was 1.46 so he likely has CKD stage III. Troponins remained negative. LDL 69. Had hypokalemia which required repletion (likely r/t prior HCTZ use). He underwent cardiac cath showing severe ostial LCx disease s/p angioplasty/Synergy stent placement with moderate prox LAD stenosis. LAD disease did not appear to be different than recent path. Patent mid LCx stent. Normal EF and LVEDP. DAPT for at least 1 year recommended - started on Effient.    Luis Tucker returns for follow-up. He was recently seen by Norma Fredrickson, NP, who felt that he was doing well. No medication changes were made. He subsequently has seen his primary care provider and was advised that he needed at DOT physical. He denies any further angina or shortness of breath.  Luis Tucker is seen today in hospital follow-up. He reports feeling fairly well  although noted his heart rate is been elevated. We went through his extensive medication list and it seems like he is taking several medicines that he probably should not be on. He was taking aspirin 325 and 81 mg as well as Effient. He's also taking HCTZ and Dyazide. In addition he still on indoor which is likely not necessary post bypass. He reports some soreness to his chest wall and numbness but he denies any cardiac chest pain. He does do some walking and gets some mild shortness of breath. EKG shows sinus tachycardia 105.  Luis Tucker returns today for follow-up. He was last seen 3 months ago. We had to correct some medication irregularities. He since has been taking his medications as we prescribed. He denies any chest pain or worsening shortness of breath. He is unfortunately gained some weight due to inactivity. He is very interested in going back to work. His surgeon Dr. Cornelius Moras feels that it's okay for him to go back from a surgical standpoint as of tomorrow, 04/03/2016. His DOT evaluator is recommending either stress test or echocardiography and as his perioperative TEE indicated an EF of 50%, I would like to repeat echocardiogram to see if LV function has improved. From a clinical standpoint he's doing very well.  09/28/2016  Luis Tucker is seen today in follow-up. Overall he is doing well however he has had weight gain since his last office visit. He was seen in the interim for evaluation of some hand swelling and his Dyazide was stopped and he was put on Lasix. He says  that Lasix is intolerable doesn't seem to help his swelling much. He wants to go back to Dyazide. He's also reported some problems with erectile dysfunction. He is interested in Levitra. He's taken it previously with some benefit. He denies any chest pain or worsening shortness of breath.  PMHx:  Past Medical History:  Diagnosis Date  . Arthritis    "little in my knees" (12/29/2015)  . CAD (coronary artery disease)    a. h/o stent to  Cx 2008. b. cath 03/2009 demonstrated patent stent to circumflex, 60% LAD stenosis. c. [place] 06/2015: severe ostial LCx disease s/p angioplasty/Synergy stent placement. Moderate prox LAD disease, unchanged from prior, patent LCx stent, normal EF.  . CKD (chronic kidney disease), stage III   . Dyslipidemia   . GERD (gastroesophageal reflux disease)   . Hepatitis    "noncontagious type"  . History of non-ST elevation myocardial infarction (NSTEMI) 03/2007   4.0 x 15 mm Vision BMS to mCFX  . History of nuclear stress test 2014   exercise myoview; perfusion defect consistent with infarct/scar (basal/mid/apical inferior regions); low risk scan   . Hypertension   . Kidney stones   . Metabolic syndrome   . Pre-diabetes   . 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 CARDIAC CATHETERIZATION  2010   [place], LAD 60%, CFX 60-70%, stent OK, RCA OK, EF 60%  . CARDIAC CATHETERIZATION N/A 07/20/2015   Procedure: Left Heart Cath and Coronary Angiography;  Surgeon: Iran Ouch, MD;  Location: [facility] INVASIVE CV LAB;  Service: Cardiovascular;  Laterality: N/A;  . CARDIAC CATHETERIZATION N/A 07/20/2015   Procedure: Coronary Stent Intervention;  Surgeon: Iran Ouch, MD;  Location: [facility] INVASIVE CV LAB;  Service: Cardiovascular;  Laterality: N/A;  . CARDIAC CATHETERIZATION N/A 12/29/2015   Procedure: Left Heart Cath and Coronary Angiography;  Surgeon: Runell Gess, MD;  Location: [facility] INVASIVE CV LAB;  Service: Cardiovascular;  Laterality: N/A;  . CORONARY ANGIOPLASTY WITH STENT PLACEMENT  03/2007   4.0 x 15-mm Vision BMS mCFX; 60% residual LAD  . CORONARY ARTERY BYPASS GRAFT N/A 01/04/2016   Procedure: CORONARY ARTERY BYPASS GRAFTING (CABG) x two using left internal mammary artery and left radial artery ;  Surgeon: Purcell Nails, MD;  Location: [facility] OR;  Service: Open Heart Surgery;  Laterality: N/A;  . KNEE ARTHROSCOPY Left 2004; 2011  . LYMPH  NODE DISSECTION Right 1973   "groin"  . RADIAL ARTERY HARVEST Left 01/04/2016   Procedure: RADIAL ARTERY HARVEST;  Surgeon: Purcell Nails, MD;  Location: [facility] OR;  Service: Open Heart Surgery;  Laterality: Left;  . TEE WITHOUT CARDIOVERSION N/A 01/04/2016   Procedure: TRANSESOPHAGEAL ECHOCARDIOGRAM (TEE);  Surgeon: Purcell Nails, MD;  Location: [facility] OR;  Service: Open Heart Surgery;  Laterality: N/A;  . TRANSTHORACIC ECHOCARDIOGRAM  04/2007   EF >55%; mild concentric LVH; borderline RV enlargement; RA & LA mildly dilated; mild MR, mild TR, mild PV regurg    FAMHx:  Family History  Problem Relation Age of Onset  . Cancer - Prostate Father     SOCHx:   reports that he quit smoking about 9 years ago. His smoking use included Cigarettes. He has a 35.00 pack-year smoking history. He has never used smokeless tobacco. He reports that he drinks alcohol. He reports that he uses drugs, including Marijuana.  ALLERGIES:  No Known Allergies  ROS: Pertinent items  noted in HPI and remainder of comprehensive ROS otherwise negative.  HOME MEDS: Current Outpatient Prescriptions  Medication Sig Dispense Refill  . acetaminophen (TYLENOL) 500 MG tablet Take 2 tablets (1,000 mg total) by mouth every 6 (six) hours as needed. 30 tablet 0  . aspirin EC 325 MG tablet Take 1 tablet (325 mg total) by mouth daily. 90 tablet 3  . atorvastatin (LIPITOR) 80 MG tablet Take 1 tablet (80 mg total) by mouth daily. 90 tablet 3  . carvedilol (COREG) 12.5 MG tablet Take 12.5 mg by mouth 2 (two) times daily with a meal.    . furosemide (LASIX) 20 MG tablet Take 20 mg by mouth as needed.    Marland Kitchen. losartan (COZAAR) 25 MG tablet TAKE 1 TABLET BY MOUTH EVERY DAY 30 tablet 8  . nitroGLYCERIN (NITROSTAT) 0.4 MG SL tablet Place 1 tablet under the tongue as needed. Reported on 02/06/2016  3  . pantoprazole (PROTONIX) 40 MG tablet Take 1 tablet (40 mg total) by mouth daily as needed (acid reflux). 30 tablet 11  .  triamterene-hydrochlorothiazide (DYAZIDE) 37.5-25 MG capsule Take 1 each (1 capsule total) by mouth daily. 30 capsule 6  . vardenafil (LEVITRA) 10 MG tablet Take 1 tablet (10 mg total) by mouth daily as needed for erectile dysfunction. 10 tablet 0   No current facility-administered medications for this visit.     LABS/IMAGING: No results found for this or any previous visit (from the past 48 hour(s)). No results found.  VITALS: BP 135/79   Pulse 66   Ht 6' (1.829 m)   Wt 249 lb 6.4 oz (113.1 kg)   BMI 33.82 kg/m   EXAM: General appearance: alert and no distress Neck: no adenopathy, no carotid bruit, no JVD, supple, symmetrical, trachea midline and thyroid not enlarged, symmetric, no tenderness/mass/nodules Lungs: clear to auscultation bilaterally Heart: regular rate and rhythm, S1, S2 normal, no murmur, click, rub or gallop and Healing midline sternal incision Abdomen: soft, non-tender; bowel sounds normal; no masses,  no organomegaly Extremities: extremities normal, atraumatic, no cyanosis or edema and Healing left radial artery harvest incision in the left forearm Pulses: 2+ and symmetric Skin: Skin color, texture, turgor normal. No rashes or lesions Neurologic: Grossly normal  EKG: Deferred  ASSESSMENT: 1. Coronary artery disease status post PCI to the circumflex in 2008 - and PCI to the ostial left circumflex with a synergy DES (2016) 2. ISR with CABG x 2 with LIMA to LAD and free radial graft to OM2 (12/2015) 3. Metabolic syndrome 4. Hypertension 5. Hyperlipidemia 6. ED  PLAN: 1.   Luis Tucker is doing well without any worsening shortness of breath or chest pain. Unfortunately has had some recent weight gain of encouraged him to continue to work on weight loss. Blood pressure appears pretty well controlled however he was taken off of Dyazide and wants to restart that. Will prescribe that today and make Lasix as needed. He's also asked for medication to treat erectile  dysfunction. Previously he had good response to Levitra. We'll prescribe 10 tablets today. Follow-up with me in 6 months and repeat a lipid profile at that time.  Chrystie NoseKenneth C. Hilty, MD, [facility] Center[facility]ACC Attending Cardiologist [facility]  Chrystie NoseKenneth C Hilty 09/28/2016, 5:22 PM

## 2016-09-28 NOTE — Patient Instructions (Addendum)
Medication Instructions:  START- Triamterene-Hydrochlorothiazide (DYAZIDE) 37.5-25 MG daily  Labwork: Fasting Lipids in 3 months  Testing/Procedures: None Ordered  Follow-Up: Your physician wants you to follow-up in:  6 Months. You will receive a reminder letter in the mail two months in advance. If you don't receive a letter, please call our office to schedule the follow-up appointment.   Any Other Special Instructions Will Be Listed Below (If Applicable).   If you need a refill on your cardiac medications before your next appointment, please call your pharmacy.

## 2017-01-07 ENCOUNTER — Ambulatory Visit: Payer: 59 | Admitting: Thoracic Surgery (Cardiothoracic Vascular Surgery)

## 2017-01-14 ENCOUNTER — Ambulatory Visit: Payer: 59 | Admitting: Thoracic Surgery (Cardiothoracic Vascular Surgery)

## 2017-01-27 ENCOUNTER — Other Ambulatory Visit: Payer: Self-pay | Admitting: Nurse Practitioner

## 2017-01-28 ENCOUNTER — Ambulatory Visit: Payer: 59 | Admitting: Thoracic Surgery (Cardiothoracic Vascular Surgery)

## 2017-02-10 ENCOUNTER — Other Ambulatory Visit: Payer: Self-pay | Admitting: Physician Assistant

## 2017-02-22 ENCOUNTER — Other Ambulatory Visit: Payer: Self-pay | Admitting: Internal Medicine

## 2017-02-25 NOTE — Telephone Encounter (Signed)
°*  STAT* If patient is at the pharmacy, call can be transferred to refill team.   1. Which medications need to be refilled? (please list name of each medication and dose if known)Carvediolol 25 mg , Atorvastatin   2. Which pharmacy/location (including street and city if local pharmacy) is medication to be sent to?[facility] [address]   3. Do they need a 30 day or 90 day supply?90

## 2017-03-09 IMAGING — CR DG CHEST 2V
2 series · 2 of 2 positions shown · non-contrast
Comparison: 04/04/2009 .

CLINICAL DATA: Abnormal stress test.

EXAM:
CHEST  2 VIEW

[w chest pa]
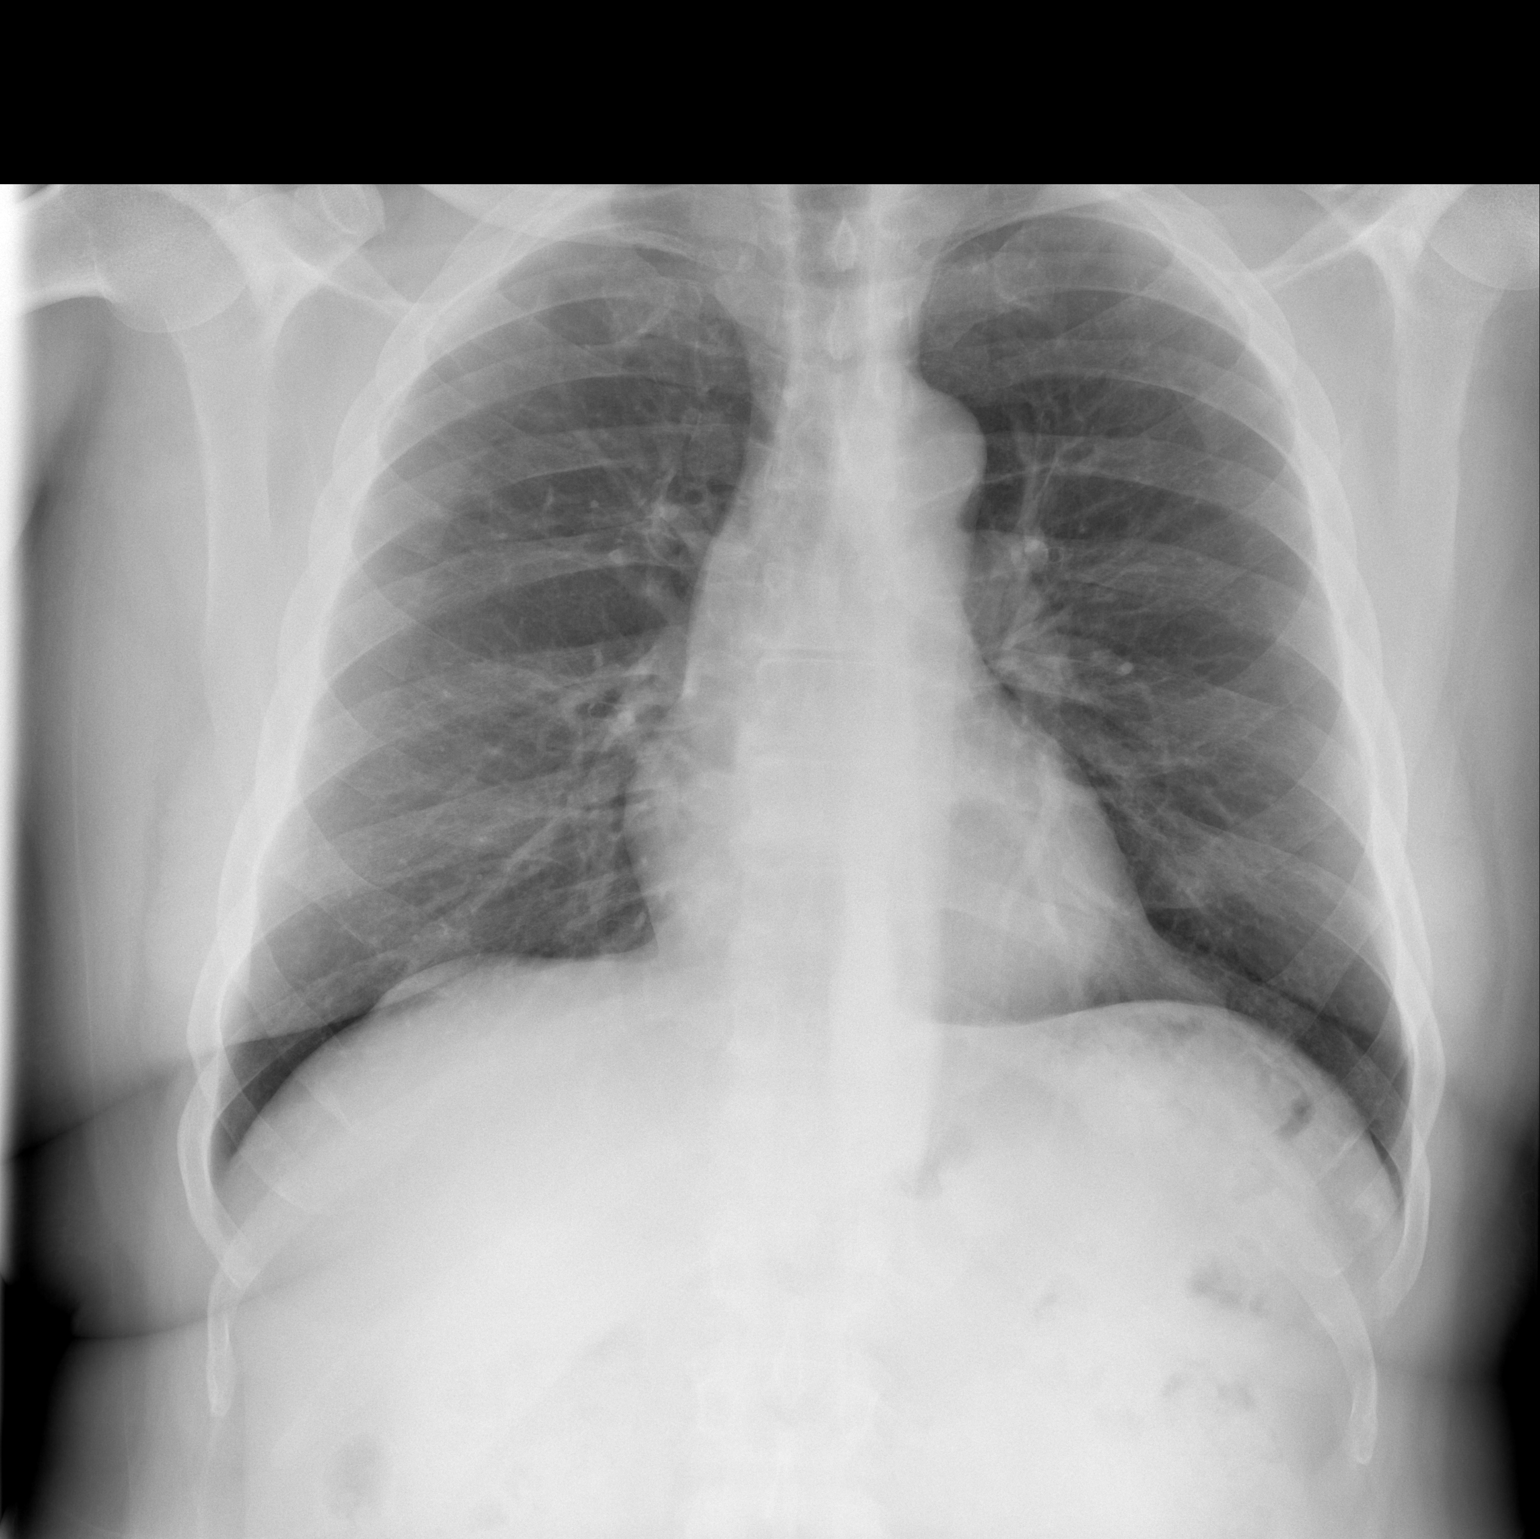

[w chest lat]
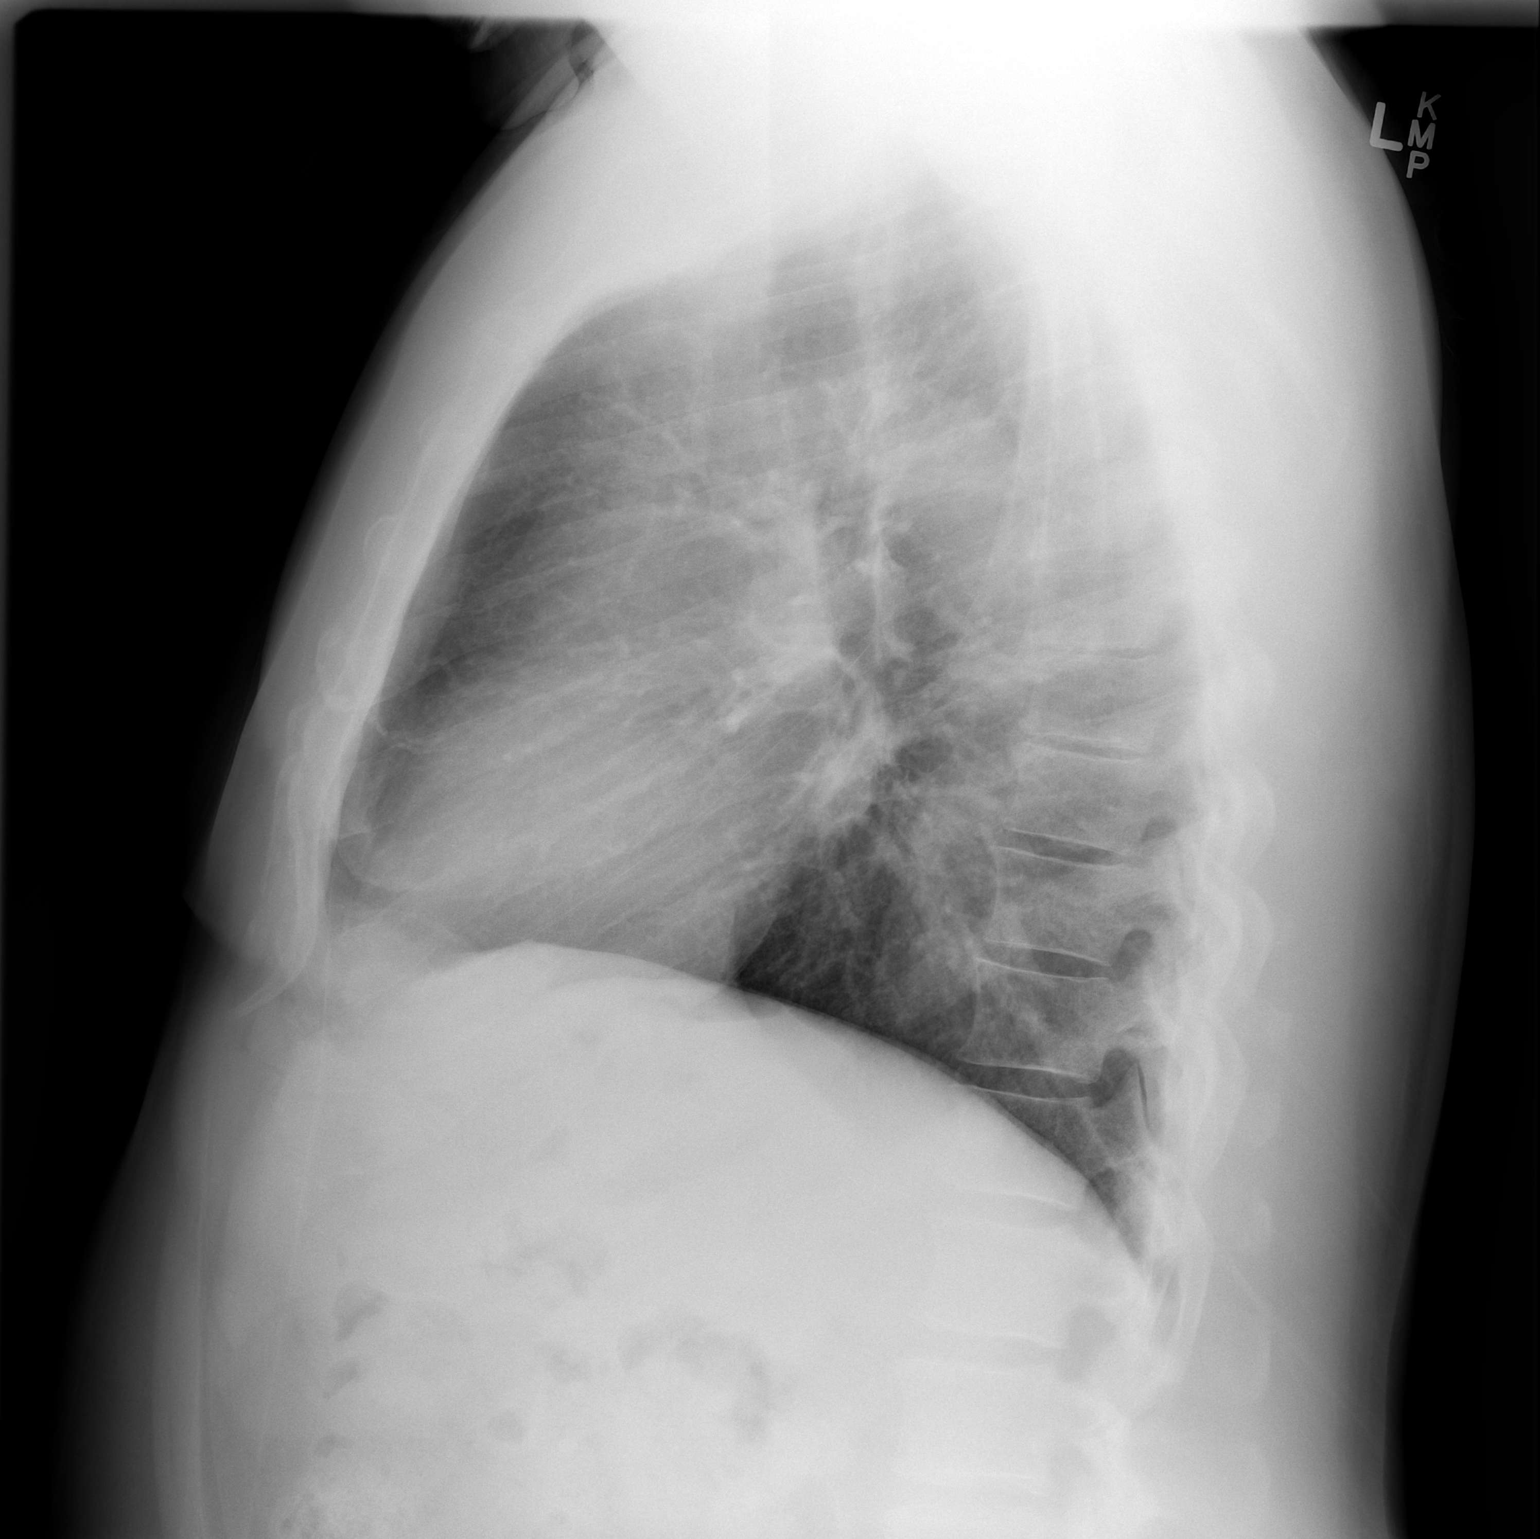

[2 of 2 positions shown; findings below may reference images not displayed]

FINDINGS: Mediastinum and hilar structures normal. Lungs are clear. Heart size
normal. No pleural effusion or pneumothorax. No acute bony
abnormality .
IMPRESSION: No acute abnormality.

## 2017-03-23 IMAGING — CR DG CHEST 1V PORT
1 series · 1 of 1 positions shown · non-contrast
Comparison: 12/31/2015

CLINICAL DATA: Atelectasis.  Intubated patient.

EXAM:
PORTABLE CHEST 1 VIEW

[AP]
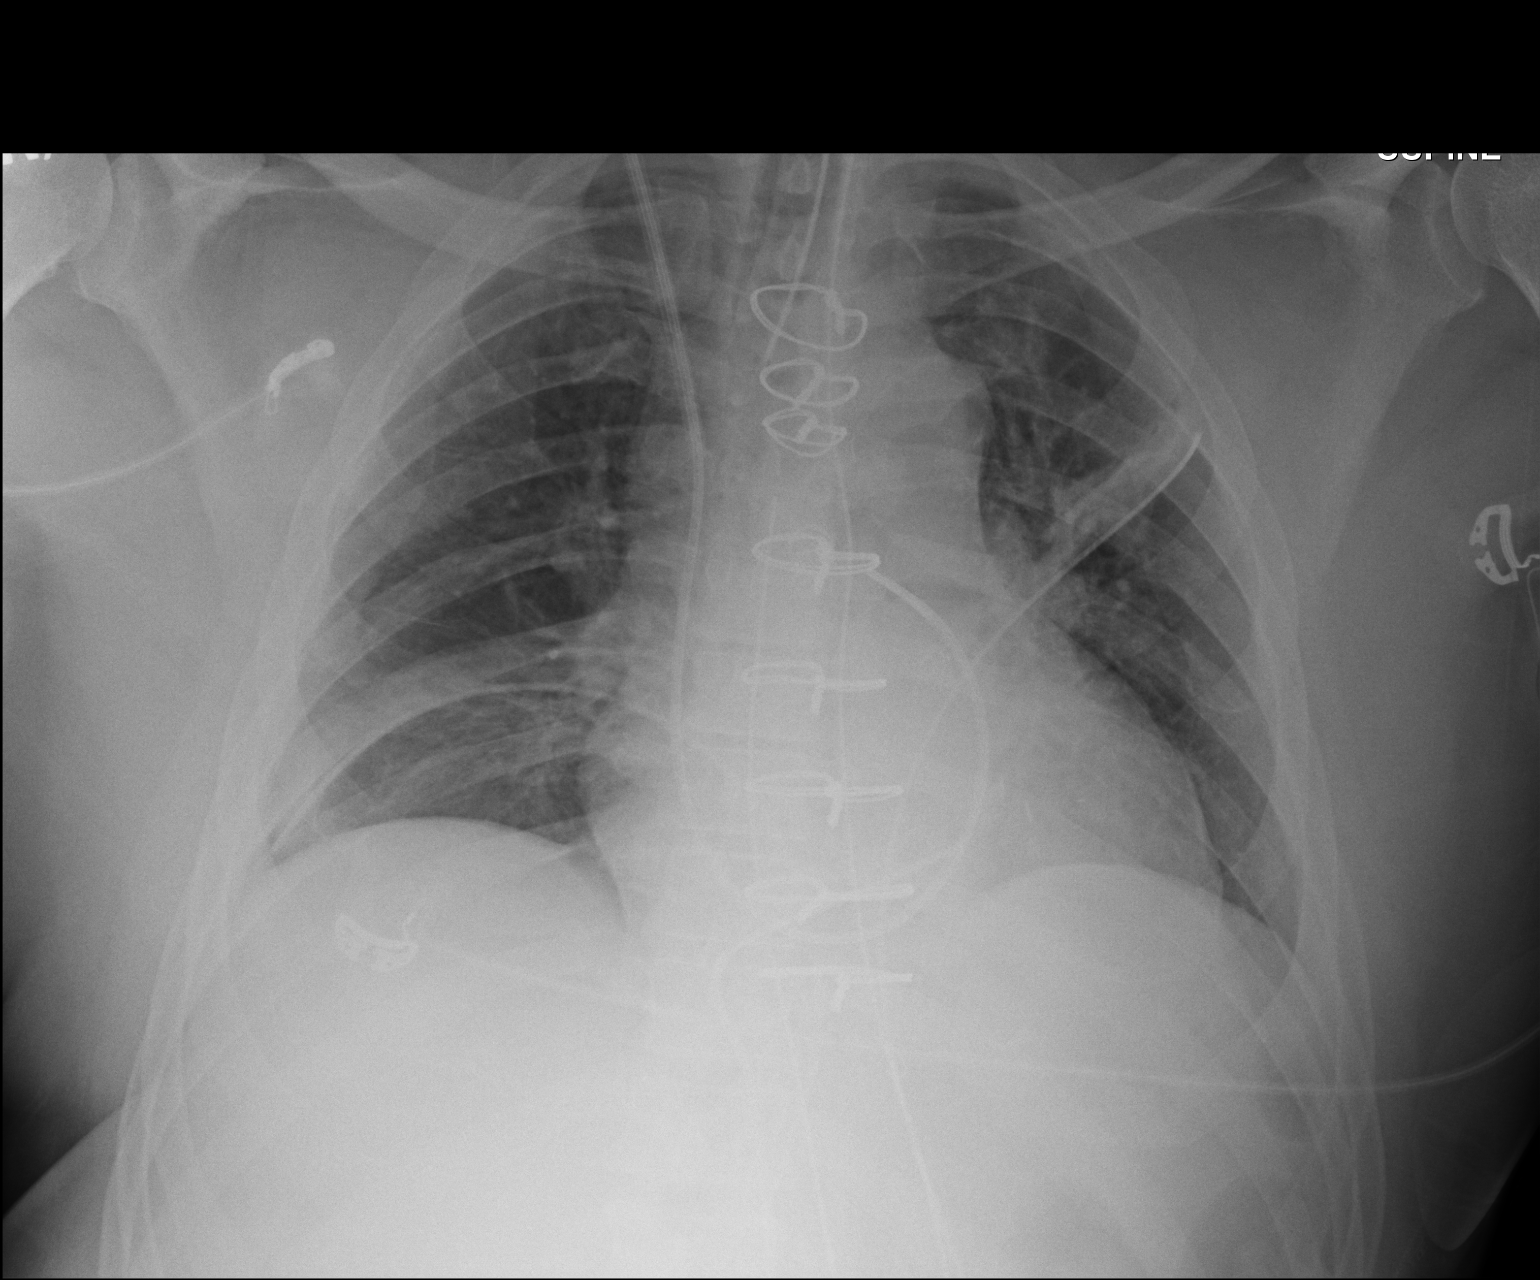

[1 of 1 positions shown; findings below may reference images not displayed]

FINDINGS: Endotracheal tube with the tip 3.3 cm above the carina. Nasogastric
tube coursing below the diaphragm. Mediastinal drains noted.
Bilateral chest tubes noted.

Swan-Ganz catheter with the tip projecting over the right
ventricular outflow tract.

There is no focal parenchymal opacity. There is no pleural effusion
or pneumothorax. The heart and mediastinal contours are
unremarkable.

The osseous structures are unremarkable.
IMPRESSION: 1. Support lines and tubing as described above.

## 2017-03-24 IMAGING — CR DG CHEST 1V PORT
1 series · 1 of 1 positions shown · non-contrast
Comparison: 01/04/2016

CLINICAL DATA: Status post coronary bypass grafting

EXAM:
PORTABLE CHEST - 1 VIEW

[AP]
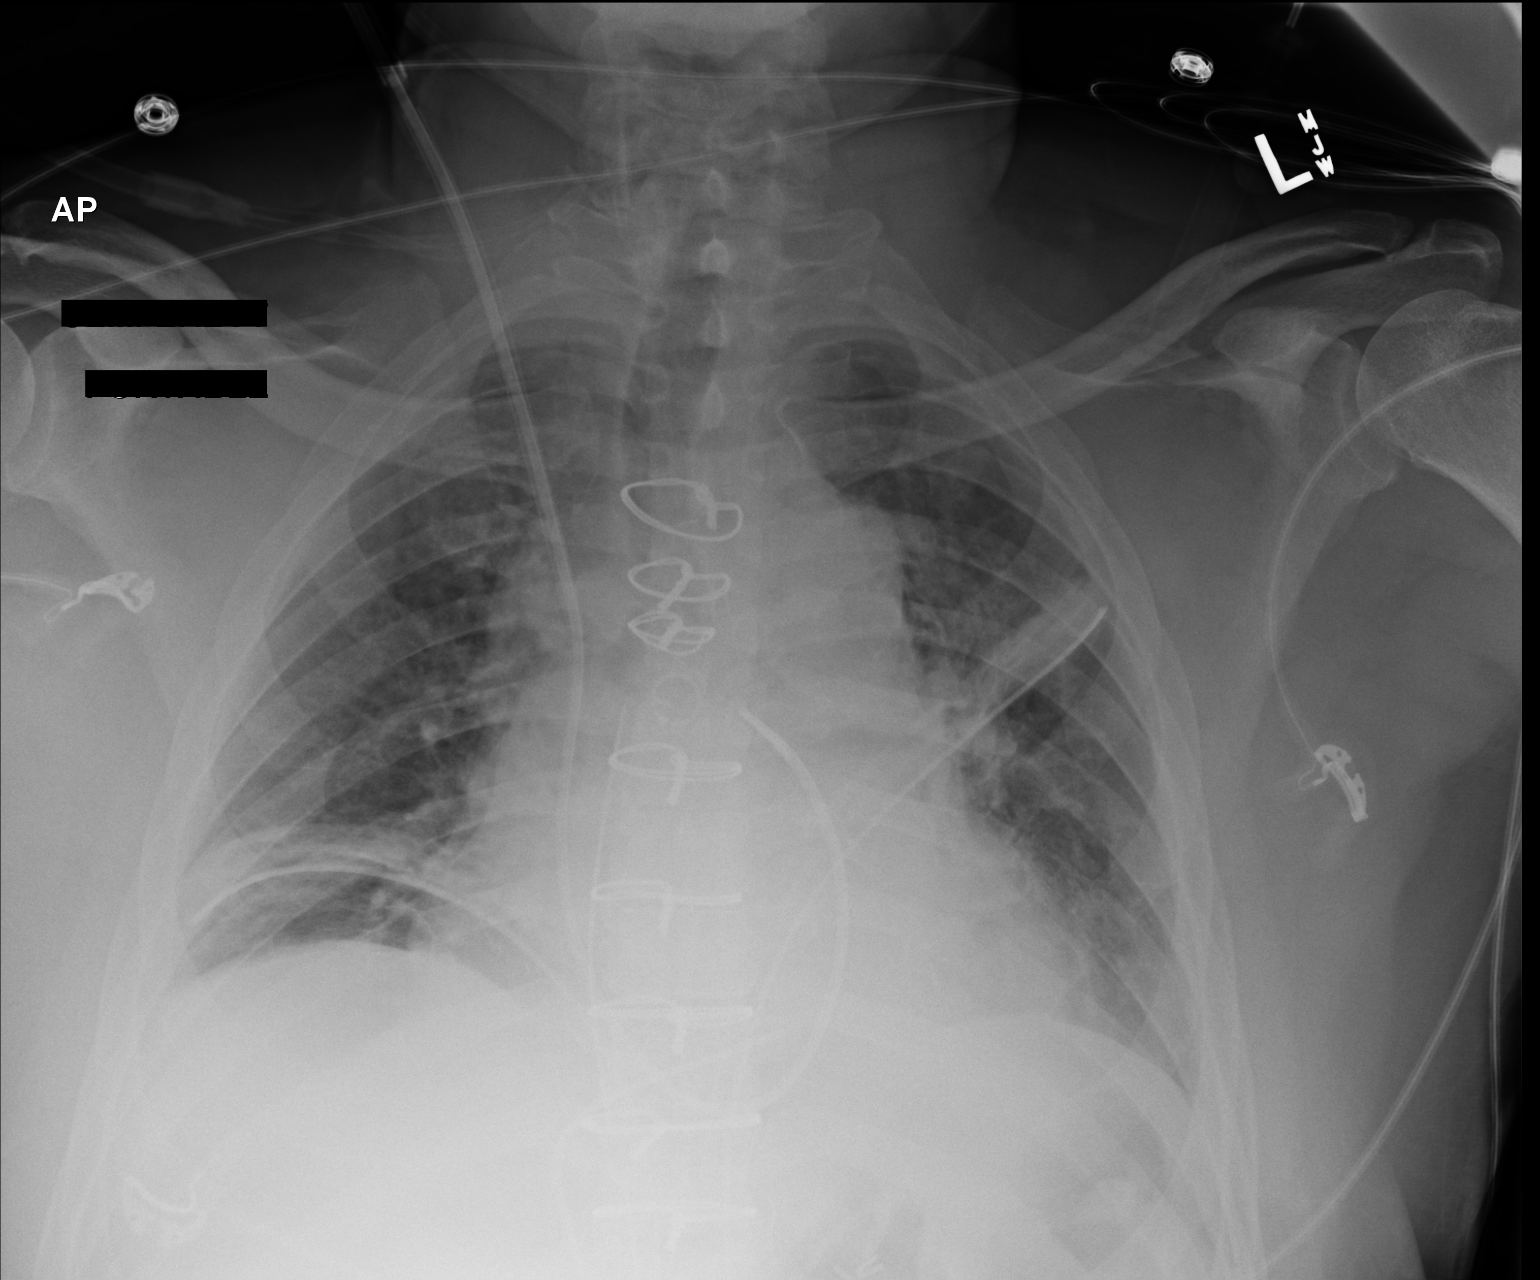

[1 of 1 positions shown; findings below may reference images not displayed]

FINDINGS: Cardiac shadow remains enlarged. A Swan-Ganz catheter is again noted
in the pulmonary outflow tract. Bilateral thoracostomy catheters and
mediastinal drainage catheter are again noted and stable. The
endotracheal tube and nasogastric catheter have been removed in the
interval. No focal infiltrate or sizable effusion is seen. No
pneumothorax is noted.
IMPRESSION: Tubes and lines as described above.

No new focal infiltrate is seen.

## 2017-03-26 IMAGING — CR DG CHEST 2V
2 series · 2 of 2 positions shown · non-contrast
Comparison: 01/06/2016

CLINICAL DATA: Atelectasis. Pt had open heart surgery on [REDACTED].
No chest complaints. Hx of HTN and CAD. Pt is a former smoker.

EXAM:
CHEST  2 VIEW

[chest pa]
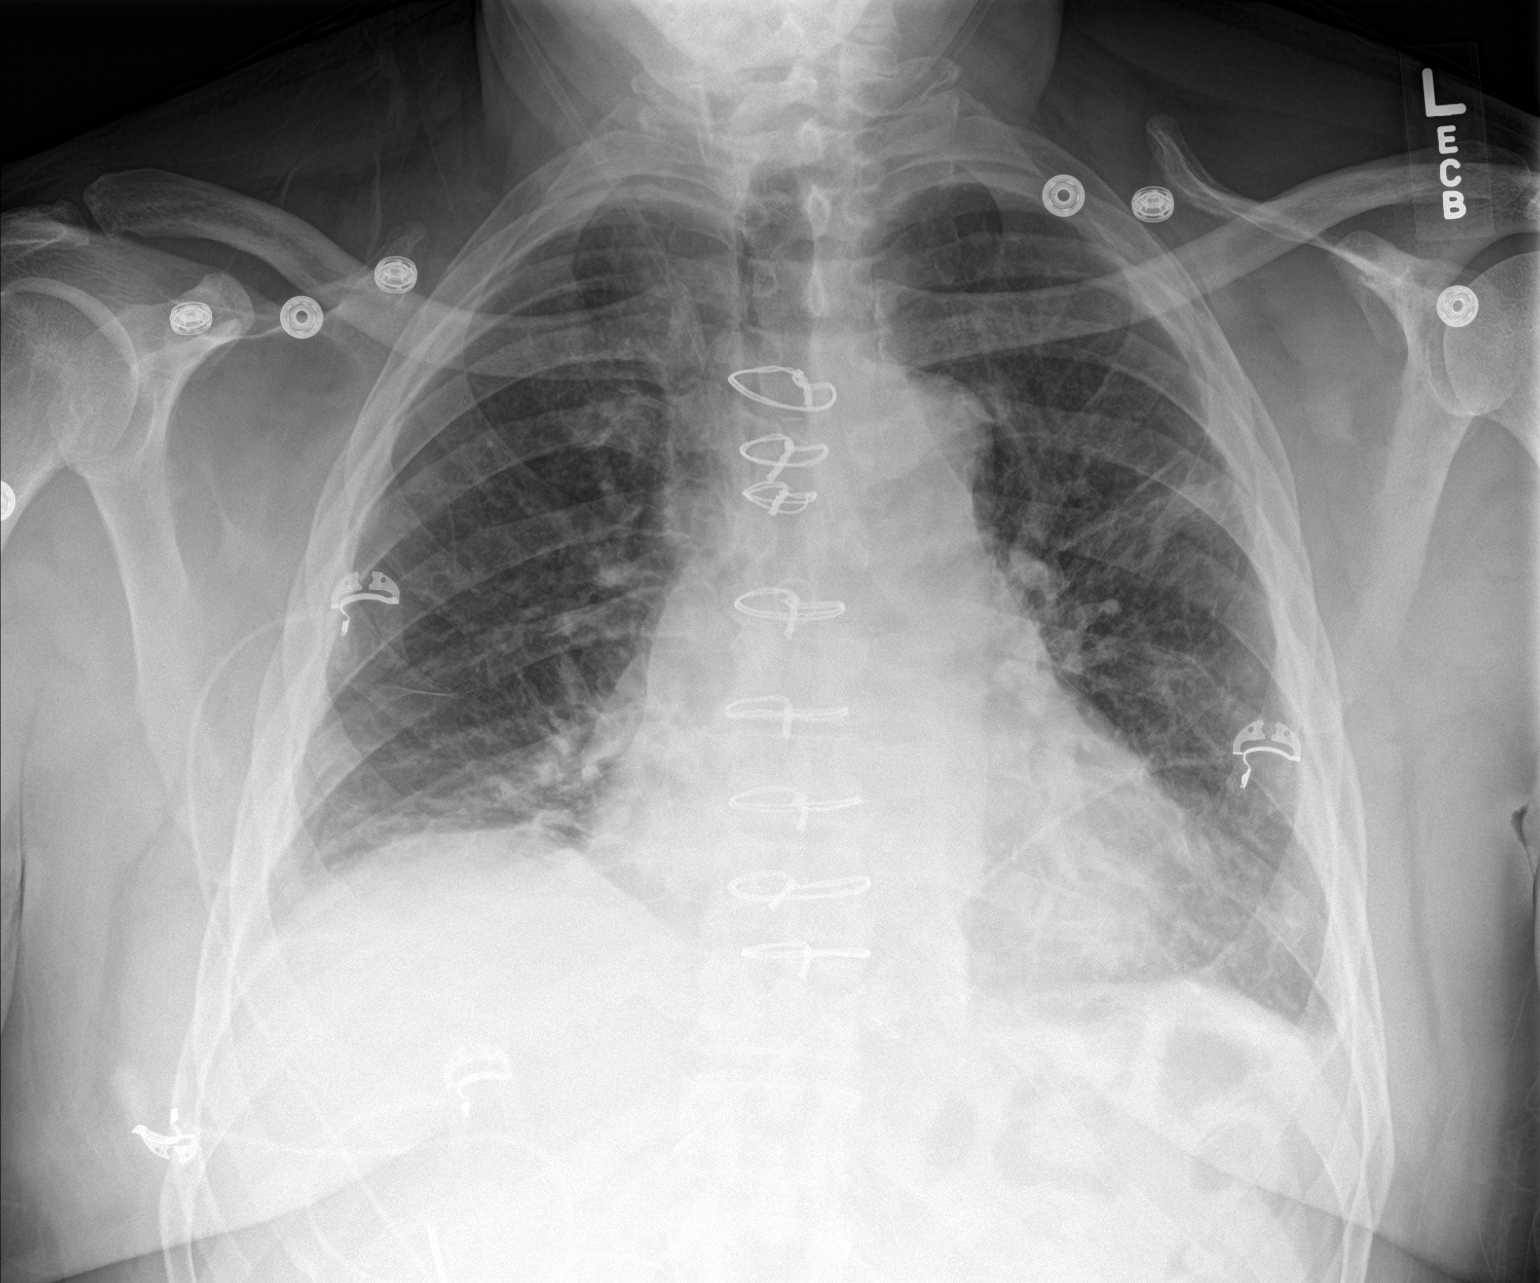

[chest lat]
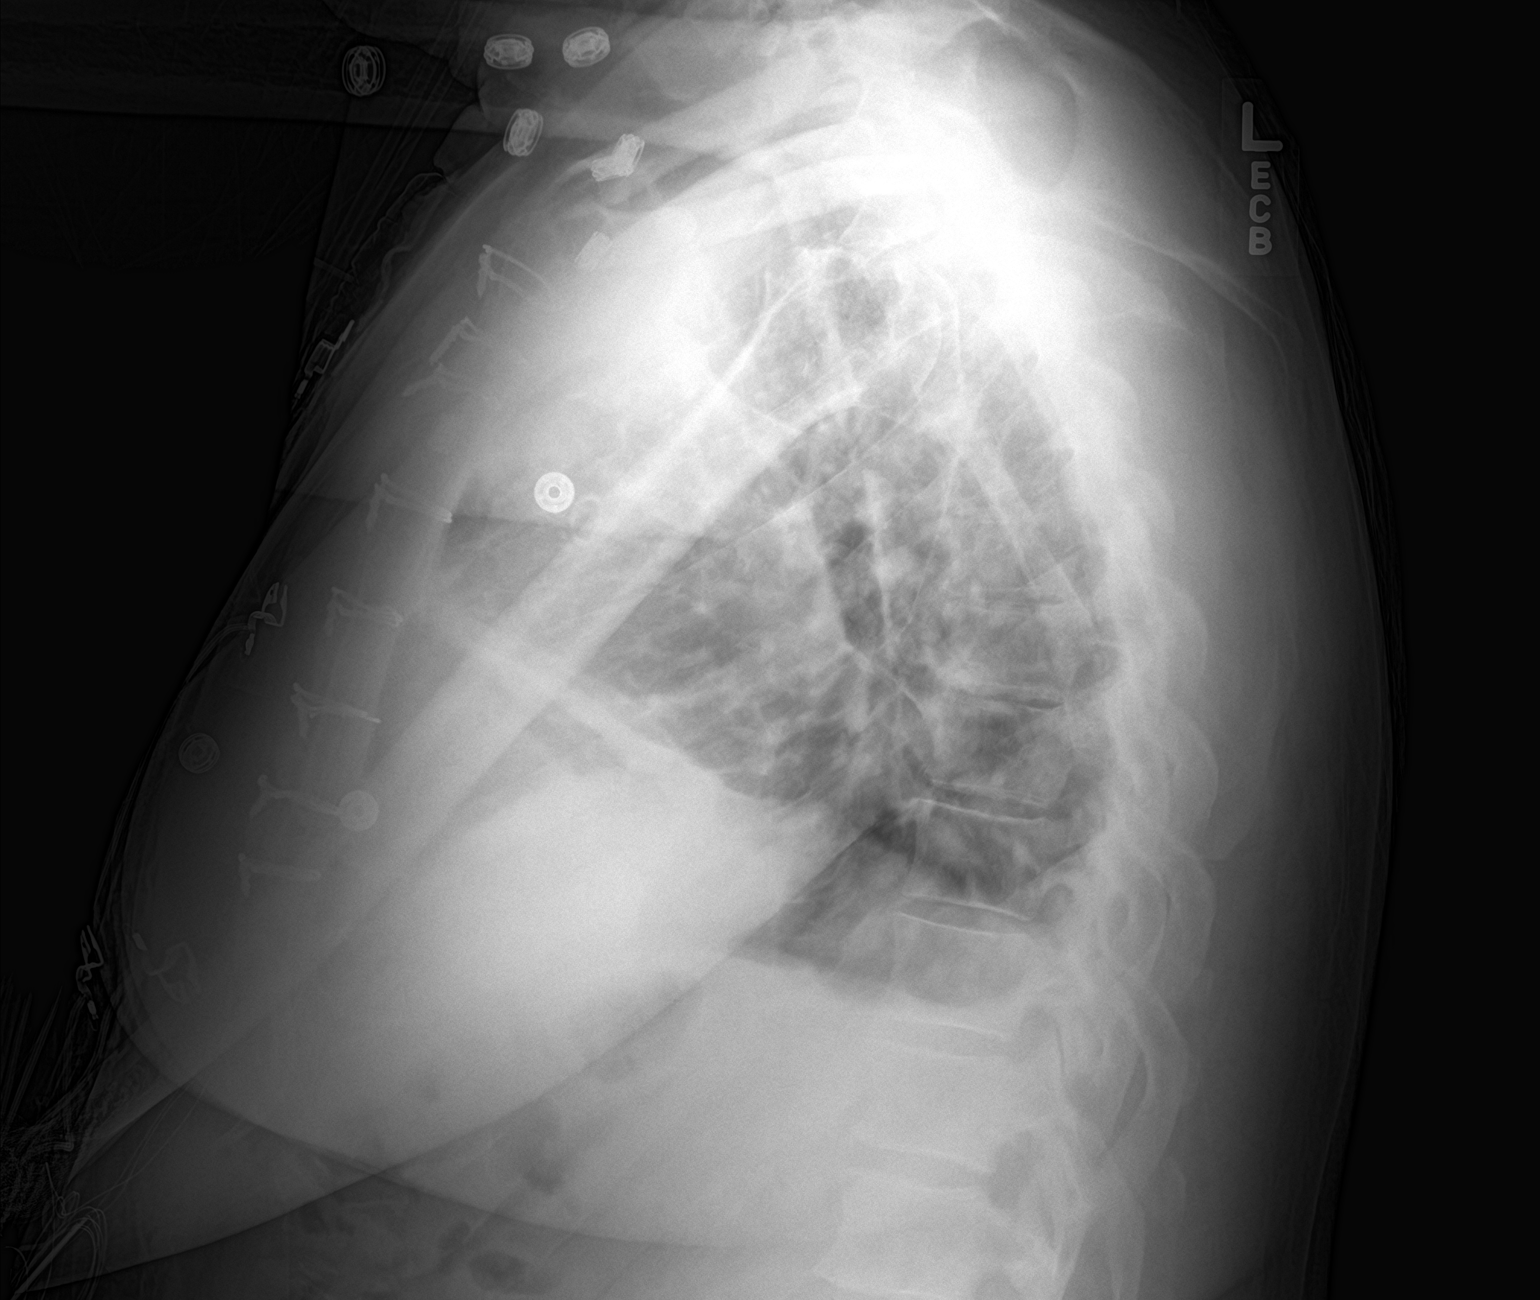

[2 of 2 positions shown; findings below may reference images not displayed]

FINDINGS: Changes from recent cardiac surgery are stable. No mediastinal
widening.

Chest tubes have been removed as has the right internal jugular
Cordis and mediastinal tube. No remaining support apparatus.

No pneumothorax. Linear and reticular atelectasis in the left mid
lung both lung bases is stable. Probable small effusions are stable.
IMPRESSION: 1. No evidence of an operative complication. No mediastinal
widening.
2. Status post removal of all remaining support apparatus. No
pneumothorax. No evidence of pulmonary edema.
3. Persistent areas of atelectasis mostly at the lung bases.

## 2017-04-19 ENCOUNTER — Other Ambulatory Visit: Payer: Self-pay | Admitting: Internal Medicine

## 2017-04-19 ENCOUNTER — Telehealth: Payer: Self-pay | Admitting: Internal Medicine

## 2017-04-19 NOTE — Telephone Encounter (Signed)
REFILL 

## 2017-04-19 NOTE — Telephone Encounter (Signed)
New Message  Pt voiced he was returning a call but there are no encounters for this patients conversation but do see something in meds tab about medications sent to [facility] but again there are no encounters for this patients conversation.  Please f/u and add telephone notes

## 2017-04-19 NOTE — Telephone Encounter (Signed)
Returned the call to the patient. He stated that he made an appointment for the 5/15 so everything was take care of already.

## 2017-05-07 ENCOUNTER — Ambulatory Visit: Payer: 59 | Admitting: Student

## 2017-05-10 ENCOUNTER — Ambulatory Visit (INDEPENDENT_AMBULATORY_CARE_PROVIDER_SITE_OTHER): Payer: 59 | Admitting: Internal Medicine

## 2017-05-10 ENCOUNTER — Encounter: Payer: Self-pay | Admitting: Internal Medicine

## 2017-05-10 VITALS — BP 133/89 | HR 65 | Ht 72.0 in | Wt 260.8 lb

## 2017-05-10 DIAGNOSIS — I2583 Coronary atherosclerosis due to lipid rich plaque: Secondary | ICD-10-CM | POA: Diagnosis not present

## 2017-05-10 DIAGNOSIS — Z01810 Encounter for preprocedural cardiovascular examination: Secondary | ICD-10-CM

## 2017-05-10 DIAGNOSIS — R9431 Abnormal electrocardiogram [ECG] [EKG]: Secondary | ICD-10-CM | POA: Insufficient documentation

## 2017-05-10 DIAGNOSIS — I2510 Atherosclerotic heart disease of native coronary artery without angina pectoris: Secondary | ICD-10-CM | POA: Diagnosis not present

## 2017-05-10 NOTE — Progress Notes (Signed)
OFFICE NOTE  Chief Complaint:  Preop knee surgery  Primary Care Physician: Arlan Organ, MD  HPI:  Luis Tucker is a pleasant 66 year old male with a history of coronary artery disease. He had a stent to the circumflex in 2008. 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 He also has hypertension dyslipidemia and metabolic syndrome. Is not very physically active as been unable to lose weight recently. 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 He denies any chest pain worsening shortness of breath over the past year and recently had laboratory work provided by Dr. Kathrynn Running at [facility] care which showed a well-controlled lipid profile.  He presented last week to [facility] with worsening chest pain concerning for angina.   His EKG showed SR, J point elevation and inferolateral T waves different from previous ECGs. He was admitted for further evaluation and placed on IV heparin. His home triamterene/HCTZ and losartan were held on admission due to Cr 1.29. Upon further review it appears prior Cr in 2011 was 1.46 so he likely has CKD stage III. Troponins remained negative. LDL 69. Had hypokalemia which required repletion (likely r/t prior HCTZ use). He underwent cardiac cath showing severe ostial LCx disease s/p angioplasty/Synergy stent placement with moderate prox LAD stenosis. LAD disease did not appear to be different than recent path. Patent mid LCx stent. Normal EF and LVEDP. DAPT for at least 1 year recommended - started on Effient.    Luis Tucker returns for follow-up. He was recently seen by Norma Fredrickson, NP, who felt that he was doing well. No medication changes were made. He subsequently has seen his primary care provider and was advised that he needed at DOT physical. He denies any further angina or shortness of breath.  Luis Tucker is seen today in hospital follow-up. He reports feeling fairly well  although noted his heart rate is been elevated. We went through his extensive medication list and it seems like he is taking several medicines that he probably should not be on. He was taking aspirin 325 and 81 mg as well as Effient. He's also taking HCTZ and Dyazide. In addition he still on indoor which is likely not necessary post bypass. He reports some soreness to his chest wall and numbness but he denies any cardiac chest pain. He does do some walking and gets some mild shortness of breath. EKG shows sinus tachycardia 105.  Luis Tucker returns today for follow-up. He was last seen 3 months ago. We had to correct some medication irregularities. He since has been taking his medications as we prescribed. He denies any chest pain or worsening shortness of breath. He is unfortunately gained some weight due to inactivity. He is very interested in going back to work. His surgeon Dr. Cornelius Moras feels that it's okay for him to go back from a surgical standpoint as of tomorrow, 04/03/2016. His DOT evaluator is recommending either stress test or echocardiography and as his perioperative TEE indicated an EF of 50%, I would like to repeat echocardiogram to see if LV function has improved. From a clinical standpoint he's doing very well.  09/28/2016  Luis Tucker is seen today in follow-up. Overall he is doing well however he has had weight gain since his last office visit. He was seen in the interim for evaluation of some hand swelling and his Dyazide was stopped and he was put on Lasix. He  says that Lasix is intolerable doesn't seem to help his swelling much. He wants to go back to Dyazide. He's also reported some problems with erectile dysfunction. He is interested in Levitra. He's taken it previously with some benefit. He denies any chest pain or worsening shortness of breath.  05/10/2017  Luis Tucker is seen today in follow-up. He is contemplating left knee surgery. He denies any new chest pain or worsening shortness of breath  however has had increasing weight gain. He is less active than he has been in the past but still continues to work as a Naval architect. He requires DOT stress testing every other year. He underwent recent LIMA to LAD and free radial graft to the OM 2 and January 2017. He seems to be doing well with this. An EKG in the office today however shows new inferior and lateral T-wave inversions. Etiology of this is unclear.  PMHx:  Past Medical History:  Diagnosis Date  . Arthritis    "little in my knees" (12/29/2015)  . CAD (coronary artery disease)    a. h/o stent to Cx 2008. b. cath 03/2009 demonstrated patent stent to circumflex, 60% LAD stenosis. c. [place] 06/2015: severe ostial LCx disease s/p angioplasty/Synergy stent placement. Moderate prox LAD disease, unchanged from prior, patent LCx stent, normal EF.  . CKD (chronic kidney disease), stage III   . Dyslipidemia   . GERD (gastroesophageal reflux disease)   . Hepatitis    "noncontagious type"  . History of non-ST elevation myocardial infarction (NSTEMI) 03/2007   4.0 x 15 mm Vision BMS to mCFX  . History of nuclear stress test 2014   exercise myoview; perfusion defect consistent with infarct/scar (basal/mid/apical inferior regions); low risk scan   . Hypertension   . Kidney stones   . Metabolic syndrome   . Pre-diabetes   . 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 CARDIAC CATHETERIZATION  2010   [place], LAD 60%, CFX 60-70%, stent OK, RCA OK, EF 60%  . CARDIAC CATHETERIZATION N/A 07/20/2015   Procedure: Left Heart Cath and Coronary Angiography;  Surgeon: Iran Ouch, MD;  Location: [facility] INVASIVE CV LAB;  Service: Cardiovascular;  Laterality: N/A;  . CARDIAC CATHETERIZATION N/A 07/20/2015   Procedure: Coronary Stent Intervention;  Surgeon: Iran Ouch, MD;  Location: [facility] INVASIVE CV LAB;  Service: Cardiovascular;  Laterality: N/A;  . CARDIAC CATHETERIZATION N/A 12/29/2015    Procedure: Left Heart Cath and Coronary Angiography;  Surgeon: Runell Gess, MD;  Location: [facility] INVASIVE CV LAB;  Service: Cardiovascular;  Laterality: N/A;  . CORONARY ANGIOPLASTY WITH STENT PLACEMENT  03/2007   4.0 x 15-mm Vision BMS mCFX; 60% residual LAD  . CORONARY ARTERY BYPASS GRAFT N/A 01/04/2016   Procedure: CORONARY ARTERY BYPASS GRAFTING (CABG) x two using left internal mammary artery and left radial artery ;  Surgeon: Purcell Nails, MD;  Location: [facility] OR;  Service: Open Heart Surgery;  Laterality: N/A;  . KNEE ARTHROSCOPY Left 2004; 2011  . LYMPH NODE DISSECTION Right 1973   "groin"  . RADIAL ARTERY HARVEST Left 01/04/2016   Procedure: RADIAL ARTERY HARVEST;  Surgeon: Purcell Nails, MD;  Location: [facility] OR;  Service: Open Heart Surgery;  Laterality: Left;  . TEE WITHOUT CARDIOVERSION N/A 01/04/2016   Procedure: TRANSESOPHAGEAL ECHOCARDIOGRAM (TEE);  Surgeon: Purcell Nails, MD;  Location: [facility] OR;  Service: Open Heart Surgery;  Laterality:  N/A;  . TRANSTHORACIC ECHOCARDIOGRAM  04/2007   EF >55%; mild concentric LVH; borderline RV enlargement; RA & LA mildly dilated; mild MR, mild TR, mild PV regurg    FAMHx:  Family History  Problem Relation Age of Onset  . Cancer - Prostate Father     SOCHx:   reports that he quit smoking about 10 years ago. His smoking use included Cigarettes. He has a 35.00 pack-year smoking history. He has never used smokeless tobacco. He reports that he drinks alcohol. He reports that he uses drugs, including Marijuana.  ALLERGIES:  No Known Allergies  ROS: Pertinent items noted in HPI and remainder of comprehensive ROS otherwise negative.  HOME MEDS: Current Outpatient Prescriptions  Medication Sig Dispense Refill  . acetaminophen (TYLENOL) 500 MG tablet Take 2 tablets (1,000 mg total) by mouth every 6 (six) hours as needed. 30 tablet 0  . aspirin EC 325 MG tablet Take 1 tablet (325 mg total) by mouth daily. 90 tablet 3  . atorvastatin (LIPITOR) 80  MG tablet TAKE 1 TABLET (80 MG TOTAL) BY MOUTH DAILY. 90 tablet 2  . carvedilol (COREG) 12.5 MG tablet TAKE 1 TABLET BY MOUTH TWICE A DAY WITH MEALS 180 tablet 2  . furosemide (LASIX) 20 MG tablet Take 20 mg by mouth as needed.    . furosemide (LASIX) 20 MG tablet TAKE 1 TABLET BY MOUTH DAILY. MAY TAKE EXTRA TABLET DAILY AS NEEDED FOR SWELLING. 60 tablet 5  . losartan (COZAAR) 25 MG tablet TAKE 1 TABLET BY MOUTH EVERY DAY 30 tablet 0  . nitroGLYCERIN (NITROSTAT) 0.4 MG SL tablet Place 1 tablet under the tongue as needed. Reported on 02/06/2016  3  . pantoprazole (PROTONIX) 40 MG tablet TAKE 1 TABLET (40 MG TOTAL) BY MOUTH DAILY AS NEEDED (ACID REFLUX). 30 tablet 6  . triamterene-hydrochlorothiazide (DYAZIDE) 37.5-25 MG capsule Take 1 each (1 capsule total) by mouth daily. 30 capsule 6  . vardenafil (LEVITRA) 10 MG tablet Take 1 tablet (10 mg total) by mouth daily as needed for erectile dysfunction. 10 tablet 0   No current facility-administered medications for this visit.     LABS/IMAGING: No results found for this or any previous visit (from the past 48 hour(s)). No results found.  VITALS: BP 133/89   Pulse 65   Ht 6' (1.829 m)   Wt 260 lb 12.8 oz (118.3 kg)   BMI 35.37 kg/m   EXAM: General appearance: alert, no distress and mildly obese Neck: no carotid bruit and no JVD Lungs: clear to auscultation bilaterally Heart: regular rate and rhythm, S1, S2 normal, no murmur, click, rub or gallop and no  Abdomen: soft, non-tender; bowel sounds normal; no masses,  no organomegaly Extremities: extremities normal, atraumatic, no cyanosis or edema and . Pulses: 2+ and symmetric Skin: Skin color, texture, turgor normal. No rashes or lesions Neurologic: Grossly normal  EKG: Normal sinus rhythm at 65, nonspecific interventricular block, inferior and lateral T-wave inversions at 65  ASSESSMENT: 1. Abnormal EKG with inferior lateral T-wave changes 2. Coronary artery disease status post PCI to  the circumflex in 2008 - and PCI to the ostial left circumflex with a synergy DES (2016) 3. ISR with CABG x 2 with LIMA to LAD and free radial graft to OM2 (12/2015) 4. Metabolic syndrome 5. Hypertension 6. Hyperlipidemia 7. ED  PLAN: 1.   [name] is contemplating knee surgery. He has abnormality is on his EKG today which are new and could represent ischemia although he is  asymptomatic from what I can tell. He is not particularly active. He continues work as a Naval architect and has a Music therapist. He will need stress testing to rule out any new ischemia even though he had bypass grafting in January of last year. I recommend a repeat exercise Myoview. If this is negative he can be cleared for knee surgery.  I'll contact her with those results and plan to see him back in 6 months or sooner as necessary.  Chrystie Nose, MD, [facility] Attending Cardiologist [facility]  Chrystie Nose 05/10/2017, 5:18 PM

## 2017-05-10 NOTE — Patient Instructions (Addendum)
Medication Instructions: No changes  Procedures/Testing: Your physician has requested that you have en exercise stress myoview. For further information please visit [email or web address]. Please follow instruction sheet, as given. This will be done at [address], [address]   Follow-Up: Your physician wants you to follow-up in: 6 months with Dr. Rennis GoldenHilty. You will receive a reminder letter in the mail two months in advance. If you don't receive a letter, please call our office to schedule the follow-up appointment.    If you need a refill on your cardiac medications before your next appointment, please call your pharmacy.

## 2017-05-15 ENCOUNTER — Telehealth (HOSPITAL_COMMUNITY): Payer: Self-pay

## 2017-05-15 NOTE — Telephone Encounter (Signed)
Encounter complete. No further attempts will be made.

## 2017-05-16 ENCOUNTER — Telehealth (HOSPITAL_COMMUNITY): Payer: Self-pay

## 2017-05-16 NOTE — Telephone Encounter (Signed)
UTR Encounter complete. 

## 2017-05-17 ENCOUNTER — Ambulatory Visit (HOSPITAL_COMMUNITY)
Admission: RE | Admit: 2017-05-17 | Discharge: 2017-05-17 | Disposition: A | Discharge status: Home / Self Care | Payer: 59 | Source: Ambulatory Visit | Attending: Cardiovascular Disease | Admitting: Cardiovascular Disease

## 2017-05-17 DIAGNOSIS — N189 Chronic kidney disease, unspecified: Secondary | ICD-10-CM | POA: Insufficient documentation

## 2017-05-17 DIAGNOSIS — I2510 Atherosclerotic heart disease of native coronary artery without angina pectoris: Secondary | ICD-10-CM | POA: Diagnosis not present

## 2017-05-17 DIAGNOSIS — Z01810 Encounter for preprocedural cardiovascular examination: Secondary | ICD-10-CM | POA: Diagnosis not present

## 2017-05-17 DIAGNOSIS — I129 Hypertensive chronic kidney disease with stage 1 through stage 4 chronic kidney disease, or unspecified chronic kidney disease: Secondary | ICD-10-CM | POA: Diagnosis not present

## 2017-05-17 DIAGNOSIS — I252 Old myocardial infarction: Secondary | ICD-10-CM | POA: Insufficient documentation

## 2017-05-17 DIAGNOSIS — I2583 Coronary atherosclerosis due to lipid rich plaque: Secondary | ICD-10-CM | POA: Insufficient documentation

## 2017-05-17 DIAGNOSIS — Z951 Presence of aortocoronary bypass graft: Secondary | ICD-10-CM | POA: Diagnosis not present

## 2017-05-17 LAB — MYOCARDIAL PERFUSION IMAGING
CHL CUP NUCLEAR SDS: 3
CHL CUP NUCLEAR SRS: 4
CHL CUP NUCLEAR SSS: 7
CHL CUP RESTING HR STRESS: 63 {beats}/min
CSEPEW: 7 METS
CSEPHR: 100 %
Exercise duration (min): 5 min
Exercise duration (sec): 31 s
LVDIAVOL: 119 mL (ref 62–150)
LVSYSVOL: 59 mL
MPHR: 156 {beats}/min
Peak HR: 157 {beats}/min
RPE: 18
TID: 0.99

## 2017-05-17 MED ORDER — TECHNETIUM TC 99M TETROFOSMIN IV KIT
31.7000 | PACK | Freq: Once | INTRAVENOUS | Status: AC | PRN
Start: 2017-05-17 — End: 2017-05-17
  Administered 2017-05-17: 31.7 via INTRAVENOUS
  Filled 2017-05-17: qty 32

## 2017-05-17 MED ORDER — TECHNETIUM TC 99M TETROFOSMIN IV KIT
10.9000 | PACK | Freq: Once | INTRAVENOUS | Status: AC | PRN
  Administered 2017-05-17: 10.9 via INTRAVENOUS
  Filled 2017-05-17: qty 11

## 2017-05-23 ENCOUNTER — Other Ambulatory Visit: Payer: Self-pay | Admitting: Internal Medicine

## 2017-06-15 ENCOUNTER — Other Ambulatory Visit: Payer: Self-pay | Admitting: Internal Medicine

## 2017-07-03 ENCOUNTER — Telehealth: Payer: Self-pay | Admitting: Internal Medicine

## 2017-07-03 NOTE — Telephone Encounter (Signed)
[facility] TKA medial & lateral w/wo patella resurfacing. Procedure scheduled for 07/15/17.   Printed stress test report where clearance from MD was noted & faxed to Attn: Rosalva FerronSherry Wills @ [phone] w/instructions to hold ASA 7 days prior to surgery if necessary  Had previously routed this report to Dr. Charlann [facility]erlin [facility] on 06/04/17

## 2017-07-04 NOTE — H&P (Signed)
TOTAL KNEE ADMISSION H&P  Patient is being admitted for left total knee arthroplasty.  Subjective:  Chief Complaint:    Left knee primary OA / pain  HPI: Luis Tucker, 66 y.o. male, has a history of pain and functional disability in the left knee due to arthritis and has failed non-surgical conservative treatments for greater than 12 weeks to includeNSAID's and/or analgesics, corticosteriod injections, viscosupplementation injections, use of assistive devices and activity modification.  Onset of symptoms was gradual, starting 40+ years ago with gradually worsening course since that time. The patient noted prior procedures on the knee to include  arthroscopy and menisectomy x2 on the left knee(s).  Patient currently rates pain in the left knee(s) at 10 out of 10 with activity. Patient has worsening of pain with activity and weight bearing, pain that interferes with activities of daily living, pain with passive range of motion, crepitus and joint swelling.  Patient has evidence of periarticular osteophytes and joint space narrowing by imaging studies.  There is no active infection.   Risks, benefits and expectations were discussed with the patient.  Risks including but not limited to the risk of anesthesia, blood clots, nerve damage, blood vessel damage, failure of the prosthesis, infection and up to and including death.  Patient understand the risks, benefits and expectations and wishes to proceed with surgery.   PCP: Arlan OrganManning, James S, MD  D/C Plans:       Home   Post-op Meds:       No Rx given  Tranexamic Acid:      To be given - IV   Decadron:      Is to be given  FYI:     ASA  Norco  DME:   Rx given for - RW and 3-n-1  PT:   OPPT - Rx given   Patient Active Problem List   Diagnosis Date Noted  . Pre-operative cardiovascular exam, new EKG abnormalities c/w ischemia 05/10/2017  . Pre-operative cardiovascular examination 04/02/2016  . S/P CABG x 2 01/04/2016  . Acute bursitis of left  shoulder 01/03/2016  . Unstable angina (HCC) 01/02/2016  . CAD (coronary artery disease) 12/29/2015  . Coronary artery disease due to lipid rich plaque   . 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 Exertional dyspnea 07/19/2015  . CAD S/P CFX PCI x 2 08/04/2013  . Metabolic syndrome 08/04/2013  . Essential hypertension 08/04/2013  . Hyperlipidemia 08/04/2013   Past Medical History:  Diagnosis Date  . Arthritis    "little in my knees" (12/29/2015)  . CAD (coronary artery disease)    a. h/o stent to Cx 2008. b. cath 03/2009 demonstrated patent stent to circumflex, 60% LAD stenosis. c. [place] 06/2015: severe ostial LCx disease s/p angioplasty/Synergy stent placement. Moderate prox LAD disease, unchanged from prior, patent LCx stent, normal EF.  . CKD (chronic kidney disease), stage III   . Dyslipidemia   . GERD (gastroesophageal reflux disease)   . Hepatitis    "noncontagious type"  . History of non-ST elevation myocardial infarction (NSTEMI) 03/2007   4.0 x 15 mm Vision BMS to mCFX  . History of nuclear stress test 2014   exercise myoview; perfusion defect consistent with infarct/scar (basal/mid/apical inferior regions); low risk scan   . Hypertension   . Kidney stones   . Metabolic syndrome   . Pre-diabetes   . 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  CARDIAC CATHETERIZATION  2010   [place], LAD 60%, CFX 60-70%, stent OK, RCA OK, EF 60%  . CARDIAC CATHETERIZATION N/A 07/20/2015   Procedure: Left Heart Cath and Coronary Angiography;  Surgeon: Iran Ouch, MD;  Location: [facility] INVASIVE CV LAB;  Service: Cardiovascular;  Laterality: N/A;  . CARDIAC CATHETERIZATION N/A 07/20/2015   Procedure: Coronary Stent Intervention;  Surgeon: Iran Ouch, MD;  Location: [facility] INVASIVE CV LAB;  Service: Cardiovascular;  Laterality: N/A;  . CARDIAC CATHETERIZATION N/A 12/29/2015   Procedure: Left Heart Cath and Coronary Angiography;  Surgeon:  Runell Gess, MD;  Location: [facility] INVASIVE CV LAB;  Service: Cardiovascular;  Laterality: N/A;  . CORONARY ANGIOPLASTY WITH STENT PLACEMENT  03/2007   4.0 x 15-mm Vision BMS mCFX; 60% residual LAD  . CORONARY ARTERY BYPASS GRAFT N/A 01/04/2016   Procedure: CORONARY ARTERY BYPASS GRAFTING (CABG) x two using left internal mammary artery and left radial artery ;  Surgeon: Purcell Nails, MD;  Location: [facility] OR;  Service: Open Heart Surgery;  Laterality: N/A;  . KNEE ARTHROSCOPY Left 2004; 2011  . LYMPH NODE DISSECTION Right 1973   "groin"  . RADIAL ARTERY HARVEST Left 01/04/2016   Procedure: RADIAL ARTERY HARVEST;  Surgeon: Purcell Nails, MD;  Location[facility] OR;  Service: Open Heart Surgery;  Laterality: Left;  . TEE WITHOUT CARDIOVERSION N/A 01/04/2016   Procedure: TRANSESOPHAGEAL ECHOCARDIOGRAM (TEE);  Surgeon: Purcell Nails, MD;  Location: [facility] OR;  Service: Open Heart Surgery;  Laterality: N/A;  . TRANSTHORACIC ECHOCARDIOGRAM  04/2007   EF >55%; mild concentric LVH; borderline RV enlargement; RA & LA mildly dilated; mild MR, mild TR, mild PV regurg    No prescriptions prior to admission.   No Known Allergies   Social History  Substance Use Topics  . Smoking status: Former Smoker    Packs/day: 1.00    Years: 35.00    Types: Cigarettes    Quit date: 03/25/2007  . Smokeless tobacco: Never Used  . Alcohol use 0.0 oz/week     Comment: 12/29/2015 "1 shot q other week or so; maybe"    Family History  Problem Relation Age of Onset  . Cancer - Prostate Father      Review of Systems  Constitutional: Negative.   HENT: Negative.   Eyes: Negative.   Respiratory: Negative.   Cardiovascular: Negative.   Gastrointestinal: Positive for heartburn.  Genitourinary: Negative.   Musculoskeletal: Positive for joint pain.  Skin: Negative.   Neurological: Negative.   Endo/Heme/Allergies: Negative.   Psychiatric/Behavioral: Negative.     Objective:  Physical Exam  Constitutional: He is oriented  to person, place, and time. He appears well-developed.  HENT:  Head: Normocephalic.  Eyes: Pupils are equal, round, and reactive to light.  Neck: Neck supple. No JVD present. No tracheal deviation present. No thyromegaly present.  Cardiovascular: Normal rate, regular rhythm and intact distal pulses.   Respiratory: Effort normal and breath sounds normal. No respiratory distress. He has no wheezes.  GI: Soft. There is no tenderness. There is no guarding.  Musculoskeletal:       Left knee: He exhibits decreased range of motion, swelling and bony tenderness. He exhibits no ecchymosis, no deformity, no laceration and no erythema. Tenderness found.  Lymphadenopathy:    He has no cervical adenopathy.  Neurological: He is alert and oriented to person, place, and time.  Skin: Skin is warm and dry.  Psychiatric: He has a normal mood and affect.  Labs:  Estimated body mass index is 35.26 kg/m as calculated from the following:   Height as of 05/17/17: 6' (1.829 m).   Weight as of 05/17/17: 117.9 kg (260 lb).   Imaging Review Plain radiographs demonstrate severe degenerative joint disease of the left knee(s). The overall alignment is neutral. The bone quality appears to be good for age and reported activity level.  Assessment/Plan:  End stage arthritis, left knee   The patient history, physical examination, clinical judgment of the provider and imaging studies are consistent with end stage degenerative joint disease of the left knee(s) and total knee arthroplasty is deemed medically necessary. The treatment options including medical management, injection therapy arthroscopy and arthroplasty were discussed at length. The risks and benefits of total knee arthroplasty were presented and reviewed. The risks due to aseptic loosening, infection, stiffness, patella tracking problems, thromboembolic complications and other imponderables were discussed. The patient acknowledged the explanation, agreed  to proceed with the plan and consent was signed. Patient is being admitted for inpatient treatment for surgery, pain control, PT, OT, prophylactic antibiotics, VTE prophylaxis, progressive ambulation and ADL's and discharge planning. The patient is planning to be discharged home.     Anastasio Auerbach Anjanette Gilkey   PA-C  07/04/2017, 10:31 AM

## 2017-07-05 NOTE — Progress Notes (Signed)
06-03-17 Surgical clearance from Dr. Rennis GoldenHilty on chart 05-20-17 [facility] Stress Test 05-10-17 [facility] EKG

## 2017-07-05 NOTE — Patient Instructions (Addendum)
Luis Tucker  07/05/2017   Your procedure is scheduled on: 07-15-17   Report to [facility] Main  Entrance Report to Admitting at 7:30 AM   Call this number if you have problems the morning of surgery  [phone]   Remember: ONLY 1 PERSON MAY GO WITH YOU TO SHORT STAY TO GET  READY MORNING OF YOUR SURGERY.  Do not eat food or drink liquids :After Midnight.     Take these medicines the morning of surgery with A SIP OF WATER: Carvedilol (Coreg) and Atorvastatin (Lipitor)                                You may not have any metal on your body including hair pins and              piercings  Do not wear jewelry, make-up, lotions, powders or perfumes, deodorant             Men may shave face and neck.   Do not bring valuables to the hospital.  IS NOT             RESPONSIBLE   FOR VALUABLES.  Contacts, dentures or bridgework may not be worn into surgery.  Leave suitcase in the car. After surgery it may be brought to your room.                 Please read over the following fact sheets you were given: _____________________________________________________________________             [facility] - Preparing for Surgery Before surgery, you can play an important role.  Because skin is not sterile, your skin needs to be as free of germs as possible.  You can reduce the number of germs on your skin by washing with CHG (chlorahexidine gluconate) soap before surgery.  CHG is an antiseptic cleaner which kills germs and bonds with the skin to continue killing germs even after washing. Please DO NOT use if you have an allergy to CHG or antibacterial soaps.  If your skin becomes reddened/irritated stop using the CHG and inform your nurse when you arrive at Short Stay. Do not shave (including legs and underarms) for at least 48 hours prior to the first CHG shower.  You may shave your face/neck. Please follow these instructions carefully:  1.  Shower with CHG Soap the  night before surgery and the  morning of Surgery.  2.  If you choose to wash your hair, wash your hair first as usual with your  normal  shampoo.  3.  After you shampoo, rinse your hair and body thoroughly to remove the  shampoo.                           4.  Use CHG as you would any other liquid soap.  You can apply chg directly  to the skin and wash                       Gently with a scrungie or clean washcloth.  5.  Apply the CHG Soap to your body ONLY FROM THE NECK DOWN.   Do not use on face/ open  Wound or open sores. Avoid contact with eyes, ears mouth and genitals (private parts).                       Wash face,  Genitals (private parts) with your normal soap.             6.  Wash thoroughly, paying special attention to the area where your surgery  will be performed.  7.  Thoroughly rinse your body with warm water from the neck down.  8.  DO NOT shower/wash with your normal soap after using and rinsing off  the CHG Soap.                9.  Pat yourself dry with a clean towel.            10.  Wear clean pajamas.            11.  Place clean sheets on your bed the night of your first shower and do not  sleep with pets. Day of Surgery : Do not apply any lotions/deodorants the morning of surgery.  Please wear clean clothes to the hospital/surgery center.  FAILURE TO FOLLOW THESE INSTRUCTIONS MAY RESULT IN THE CANCELLATION OF YOUR SURGERY PATIENT SIGNATURE_________________________________  NURSE SIGNATURE__________________________________  ________________________________________________________________________   Luis Tucker  An incentive spirometer is a tool that can help keep your lungs clear and active. This tool measures how well you are filling your lungs with each breath. Taking long deep breaths may help reverse or decrease the chance of developing breathing (pulmonary) problems (especially infection) following:  A long period of time when you  are unable to move or be active. BEFORE THE PROCEDURE   If the spirometer includes an indicator to show your best effort, your nurse or respiratory therapist will set it to a desired goal.  If possible, sit up straight or lean slightly forward. Try not to slouch.  Hold the incentive spirometer in an upright position. INSTRUCTIONS FOR USE  1. Sit on the edge of your bed if possible, or sit up as far as you can in bed or on a chair. 2. Hold the incentive spirometer in an upright position. 3. Breathe out normally. 4. Place the mouthpiece in your mouth and seal your lips tightly around it. 5. Breathe in slowly and as deeply as possible, raising the piston or the ball toward the top of the column. 6. Hold your breath for 3-5 seconds or for as long as possible. Allow the piston or ball to fall to the bottom of the column. 7. Remove the mouthpiece from your mouth and breathe out normally. 8. Rest for a few seconds and repeat Steps 1 through 7 at least 10 times every 1-2 hours when you are awake. Take your time and take a few normal breaths between deep breaths. 9. The spirometer may include an indicator to show your best effort. Use the indicator as a goal to work toward during each repetition. 10. After each set of 10 deep breaths, practice coughing to be sure your lungs are clear. If you have an incision (the cut made at the time of surgery), support your incision when coughing by placing a pillow or rolled up towels firmly against it. Once you are able to get out of bed, walk around indoors and cough well. You may stop using the incentive spirometer when instructed by your caregiver.  RISKS AND COMPLICATIONS  Take your time so you do not get  dizzy or light-headed.  If you are in pain, you may need to take or ask for pain medication before doing incentive spirometry. It is harder to take a deep breath if you are having pain. AFTER USE  Rest and breathe slowly and easily.  It can be helpful to  keep track of a log of your progress. Your caregiver can provide you with a simple table to help with this. If you are using the spirometer at home, follow these instructions: [place] IF:   You are having difficultly using the spirometer.  You have trouble using the spirometer as often as instructed.  Your pain medication is not giving enough relief while using the spirometer.  You develop fever of 100.5 F (38.1 C) or higher. SEEK IMMEDIATE MEDICAL CARE IF:   You cough up bloody sputum that had not been present before.  You develop fever of 102 F (38.9 C) or greater.  You develop worsening pain at or near the incision site. MAKE SURE YOU:   Understand these instructions.  Will watch your condition.  Will get help right away if you are not doing well or get worse. Document Released: 04/22/2007 Document Revised: 03/03/2012 Document Reviewed: 06/23/2007 ExitCare Patient Information 2014 ExitCare, [place].   ________________________________________________________________________  WHAT IS A BLOOD TRANSFUSION? Blood Transfusion Information  A transfusion is the replacement of blood or some of its parts. Blood is made up of multiple cells which provide different functions.  Red blood cells carry oxygen and are used for blood loss replacement.  White blood cells fight against infection.  Platelets control bleeding.  Plasma helps clot blood.  Other blood products are available for specialized needs, such as hemophilia or other clotting disorders. BEFORE THE TRANSFUSION  Who gives blood for transfusions?   Healthy volunteers who are fully evaluated to make sure their blood is safe. This is blood bank blood. Transfusion therapy is the safest it has ever been in the practice of medicine. Before blood is taken from a donor, a complete history is taken to make sure that person has no history of diseases nor engages in risky social behavior (examples are intravenous drug  use or sexual activity with multiple partners). The donor's travel history is screened to minimize risk of transmitting infections, such as malaria. The donated blood is tested for signs of infectious diseases, such as HIV and hepatitis. The blood is then tested to be sure it is compatible with you in order to minimize the chance of a transfusion reaction. If you or a relative donates blood, this is often done in anticipation of surgery and is not appropriate for emergency situations. It takes many days to process the donated blood. RISKS AND COMPLICATIONS Although transfusion therapy is very safe and saves many lives, the main dangers of transfusion include:   Getting an infectious disease.  Developing a transfusion reaction. This is an allergic reaction to something in the blood you were given. Every precaution is taken to prevent this. The decision to have a blood transfusion has been considered carefully by your caregiver before blood is given. Blood is not given unless the benefits outweigh the risks. AFTER THE TRANSFUSION  Right after receiving a blood transfusion, you will usually feel much better and more energetic. This is especially true if your red blood cells have gotten low (anemic). The transfusion raises the level of the red blood cells which carry oxygen, and this usually causes an energy increase.  The nurse administering the transfusion will  monitor you carefully for complications. HOME CARE INSTRUCTIONS  No special instructions are needed after a transfusion. You may find your energy is better. Speak with your caregiver about any limitations on activity for underlying diseases you may have. SEEK MEDICAL CARE IF:   Your condition is not improving after your transfusion.  You develop redness or irritation at the intravenous (IV) site. SEEK IMMEDIATE MEDICAL CARE IF:  Any of the following symptoms occur over the next 12 hours:  Shaking chills.  You have a temperature by mouth  above 102 F (38.9 C), not controlled by medicine.  Chest, back, or muscle pain.  People around you feel you are not acting correctly or are confused.  Shortness of breath or difficulty breathing.  Dizziness and fainting.  You get a rash or develop hives.  You have a decrease in urine output.  Your urine turns a dark color or changes to pink, red, or brown. Any of the following symptoms occur over the next 10 days:  You have a temperature by mouth above 102 F (38.9 C), not controlled by medicine.  Shortness of breath.  Weakness after normal activity.  The white part of the eye turns yellow (jaundice).  You have a decrease in the amount of urine or are urinating less often.  Your urine turns a dark color or changes to pink, red, or brown. Document Released: 12/07/2000 Document Revised: 03/03/2012 Document Reviewed: 07/26/2008 [facility] Patient Information 2014 [place], [place].  _______________________________________________________________________

## 2017-07-08 ENCOUNTER — Encounter (HOSPITAL_COMMUNITY): Payer: Self-pay

## 2017-07-08 ENCOUNTER — Encounter (HOSPITAL_COMMUNITY)
Admission: RE | Admit: 2017-07-08 | Discharge: 2017-07-08 | Disposition: A | Discharge status: Home / Self Care | Payer: Non-veteran care | Source: Ambulatory Visit | Attending: Orthopedic Surgery | Admitting: Orthopedic Surgery

## 2017-07-08 DIAGNOSIS — M1712 Unilateral primary osteoarthritis, left knee: Secondary | ICD-10-CM | POA: Diagnosis not present

## 2017-07-08 DIAGNOSIS — Z01812 Encounter for preprocedural laboratory examination: Secondary | ICD-10-CM | POA: Diagnosis present

## 2017-07-08 DIAGNOSIS — M25562 Pain in left knee: Secondary | ICD-10-CM | POA: Insufficient documentation

## 2017-07-08 LAB — BASIC METABOLIC PANEL
Anion gap: 8 (ref 5–15)
BUN: 17 mg/dL (ref 6–20)
CO2: 27 mmol/L (ref 22–32)
CREATININE: 1.12 mg/dL (ref 0.61–1.24)
Calcium: 8.9 mg/dL (ref 8.9–10.3)
Chloride: 105 mmol/L (ref 101–111)
GFR calc Af Amer: >60 mL/min (ref >60)
Glucose, Bld: 110 mg/dL — ABNORMAL HIGH (ref 65–99)
POTASSIUM: 4 mmol/L (ref 3.5–5.1)
Sodium: 140 mmol/L (ref 135–145)

## 2017-07-08 LAB — CBC
HEMATOCRIT: 42.8 % (ref 39.0–52.0)
Hemoglobin: 14 g/dL (ref 13.0–17.0)
MCH: 27.2 pg (ref 26.0–34.0)
MCHC: 32.7 g/dL (ref 30.0–36.0)
MCV: 83.1 fL (ref 78.0–100.0)
PLATELETS: 151 10*3/uL (ref 150–400)
RBC: 5.15 MIL/uL (ref 4.22–5.81)
RDW: 14.2 % (ref 11.5–15.5)
WBC: 7.2 10*3/uL (ref 4.0–10.5)

## 2017-07-08 LAB — SURGICAL PCR SCREEN
MRSA, PCR: NEGATIVE
STAPHYLOCOCCUS AUREUS: NEGATIVE

## 2017-07-08 LAB — ABO/RH: ABO/RH(D): A POS

## 2017-07-08 NOTE — Progress Notes (Signed)
04-02-16 (EPIC) ECHO, no stenosis, no regurgitation

## 2017-07-09 LAB — HEMOGLOBIN A1C
Hgb A1c MFr Bld: 5.8 % — ABNORMAL HIGH (ref 4.8–5.6)
Mean Plasma Glucose: 120 mg/dL

## 2017-07-15 ENCOUNTER — Encounter (HOSPITAL_COMMUNITY)
Admission: RE | Disposition: A | Discharge status: Home / Self Care | Payer: Self-pay | Source: Ambulatory Visit | Attending: Orthopedic Surgery

## 2017-07-15 ENCOUNTER — Inpatient Hospital Stay (HOSPITAL_COMMUNITY): Payer: Non-veteran care | Admitting: Certified Registered Nurse Anesthetist

## 2017-07-15 ENCOUNTER — Encounter (HOSPITAL_COMMUNITY): Payer: Self-pay | Admitting: Certified Registered Nurse Anesthetist

## 2017-07-15 ENCOUNTER — Observation Stay (HOSPITAL_COMMUNITY)
Admission: RE | Admit: 2017-07-15 | Discharge: 2017-07-16 | Disposition: A | Discharge status: Home / Self Care | Payer: Non-veteran care | Source: Ambulatory Visit | Attending: Orthopedic Surgery | Admitting: Orthopedic Surgery

## 2017-07-15 DIAGNOSIS — M1712 Unilateral primary osteoarthritis, left knee: Principal | ICD-10-CM | POA: Insufficient documentation

## 2017-07-15 DIAGNOSIS — E785 Hyperlipidemia, unspecified: Secondary | ICD-10-CM | POA: Insufficient documentation

## 2017-07-15 DIAGNOSIS — Z951 Presence of aortocoronary bypass graft: Secondary | ICD-10-CM | POA: Diagnosis not present

## 2017-07-15 DIAGNOSIS — I2510 Atherosclerotic heart disease of native coronary artery without angina pectoris: Secondary | ICD-10-CM | POA: Insufficient documentation

## 2017-07-15 DIAGNOSIS — N183 Chronic kidney disease, stage 3 (moderate): Secondary | ICD-10-CM | POA: Insufficient documentation

## 2017-07-15 DIAGNOSIS — Z87891 Personal history of nicotine dependence: Secondary | ICD-10-CM | POA: Diagnosis not present

## 2017-07-15 DIAGNOSIS — R531 Weakness: Secondary | ICD-10-CM | POA: Insufficient documentation

## 2017-07-15 DIAGNOSIS — I120 Hypertensive chronic kidney disease with stage 5 chronic kidney disease or end stage renal disease: Secondary | ICD-10-CM | POA: Insufficient documentation

## 2017-07-15 DIAGNOSIS — R7303 Prediabetes: Secondary | ICD-10-CM | POA: Diagnosis not present

## 2017-07-15 DIAGNOSIS — K219 Gastro-esophageal reflux disease without esophagitis: Secondary | ICD-10-CM | POA: Diagnosis not present

## 2017-07-15 DIAGNOSIS — R2681 Unsteadiness on feet: Secondary | ICD-10-CM | POA: Insufficient documentation

## 2017-07-15 DIAGNOSIS — I252 Old myocardial infarction: Secondary | ICD-10-CM | POA: Diagnosis not present

## 2017-07-15 DIAGNOSIS — Z96659 Presence of unspecified artificial knee joint: Secondary | ICD-10-CM

## 2017-07-15 HISTORY — PX: TOTAL KNEE ARTHROPLASTY: SHX125

## 2017-07-15 LAB — TYPE AND SCREEN
ABO/RH(D): A POS
Antibody Screen: NEGATIVE

## 2017-07-15 SURGERY — ARTHROPLASTY, KNEE, TOTAL
Anesthesia: Spinal | Site: Knee | Laterality: Left

## 2017-07-15 MED ORDER — STERILE WATER FOR IRRIGATION IR SOLN
Status: DC | PRN
  Administered 2017-07-15: 2000 mL

## 2017-07-15 MED ORDER — KETOROLAC TROMETHAMINE 30 MG/ML IJ SOLN
INTRAMUSCULAR | Status: DC | PRN
  Administered 2017-07-15: 30 mg

## 2017-07-15 MED ORDER — MIDAZOLAM HCL 5 MG/5ML IJ SOLN
INTRAMUSCULAR | Status: DC | PRN
  Administered 2017-07-15: 2 mg via INTRAVENOUS

## 2017-07-15 MED ORDER — CEFAZOLIN SODIUM-DEXTROSE 2-4 GM/100ML-% IV SOLN
INTRAVENOUS | Status: AC
  Filled 2017-07-15: qty 100

## 2017-07-15 MED ORDER — ATORVASTATIN CALCIUM 40 MG PO TABS
80.0000 mg | ORAL_TABLET | Freq: Every day | ORAL | Status: DC
  Administered 2017-07-16: 11:00:00 80 mg via ORAL
  Filled 2017-07-15: qty 2

## 2017-07-15 MED ORDER — TRANEXAMIC ACID 1000 MG/10ML IV SOLN
1000.0000 mg | INTRAVENOUS | Status: AC
  Administered 2017-07-15: 1000 mg via INTRAVENOUS
  Filled 2017-07-15: qty 1100

## 2017-07-15 MED ORDER — LACTATED RINGERS IV SOLN
INTRAVENOUS | Status: DC
  Administered 2017-07-15 (×2): via INTRAVENOUS

## 2017-07-15 MED ORDER — PROPOFOL 500 MG/50ML IV EMUL
INTRAVENOUS | Status: DC | PRN
  Administered 2017-07-15: 100 ug/kg/min via INTRAVENOUS

## 2017-07-15 MED ORDER — SODIUM CHLORIDE 0.9 % IJ SOLN
INTRAMUSCULAR | Status: AC
  Filled 2017-07-15: qty 50

## 2017-07-15 MED ORDER — METOCLOPRAMIDE HCL 5 MG/ML IJ SOLN: 5.0000 mg | Freq: Three times a day (TID) | INTRAMUSCULAR | Status: DC | PRN

## 2017-07-15 MED ORDER — BUPIVACAINE-EPINEPHRINE (PF) 0.25% -1:200000 IJ SOLN
INTRAMUSCULAR | Status: DC | PRN
  Administered 2017-07-15: 30 mL

## 2017-07-15 MED ORDER — VARDENAFIL HCL 10 MG PO TABS: 10.0000 mg | ORAL_TABLET | Freq: Every day | ORAL | Status: DC | PRN

## 2017-07-15 MED ORDER — ASPIRIN 81 MG PO CHEW: 81.0000 mg | CHEWABLE_TABLET | Freq: Two times a day (BID) | ORAL | 0 refills | Status: AC

## 2017-07-15 MED ORDER — TRIAMTERENE-HCTZ 37.5-25 MG PO CAPS
1.0000 | ORAL_CAPSULE | Freq: Every day | ORAL | Status: DC
  Administered 2017-07-15 – 2017-07-16 (×2): 1 via ORAL
  Filled 2017-07-15 (×4): qty 1

## 2017-07-15 MED ORDER — PHENOL 1.4 % MT LIQD: 1.0000 | OROMUCOSAL | Status: DC | PRN

## 2017-07-15 MED ORDER — CEFAZOLIN SODIUM-DEXTROSE 2-4 GM/100ML-% IV SOLN
2.0000 g | Freq: Four times a day (QID) | INTRAVENOUS | Status: AC
  Administered 2017-07-15 (×2): 2 g via INTRAVENOUS
  Filled 2017-07-15 (×2): qty 100

## 2017-07-15 MED ORDER — PHENYLEPHRINE HCL 10 MG/ML IJ SOLN
INTRAMUSCULAR | Status: DC | PRN
  Administered 2017-07-15 (×5): 80 ug via INTRAVENOUS

## 2017-07-15 MED ORDER — 0.9 % SODIUM CHLORIDE (POUR BTL) OPTIME
TOPICAL | Status: DC | PRN
  Administered 2017-07-15: 1000 mL

## 2017-07-15 MED ORDER — BUPIVACAINE-EPINEPHRINE (PF) 0.25% -1:200000 IJ SOLN
INTRAMUSCULAR | Status: AC
  Filled 2017-07-15: qty 30

## 2017-07-15 MED ORDER — PROPOFOL 10 MG/ML IV BOLUS
INTRAVENOUS | Status: AC
  Filled 2017-07-15: qty 20

## 2017-07-15 MED ORDER — TRANEXAMIC ACID 1000 MG/10ML IV SOLN
1000.0000 mg | Freq: Once | INTRAVENOUS | Status: AC
  Administered 2017-07-15: 1000 mg via INTRAVENOUS
  Filled 2017-07-15: qty 1100

## 2017-07-15 MED ORDER — LIDOCAINE 2% (20 MG/ML) 5 ML SYRINGE
INTRAMUSCULAR | Status: AC
  Filled 2017-07-15: qty 5

## 2017-07-15 MED ORDER — METHOCARBAMOL 500 MG PO TABS
500.0000 mg | ORAL_TABLET | Freq: Four times a day (QID) | ORAL | Status: DC | PRN
  Administered 2017-07-16: 11:00:00 500 mg via ORAL
  Filled 2017-07-15: qty 1

## 2017-07-15 MED ORDER — METOCLOPRAMIDE HCL 5 MG PO TABS: 5.0000 mg | ORAL_TABLET | Freq: Three times a day (TID) | ORAL | Status: DC | PRN

## 2017-07-15 MED ORDER — PANTOPRAZOLE SODIUM 40 MG PO TBEC
40.0000 mg | DELAYED_RELEASE_TABLET | Freq: Every day | ORAL | Status: DC | PRN
  Administered 2017-07-16: 40 mg via ORAL
  Filled 2017-07-15: qty 1

## 2017-07-15 MED ORDER — MENTHOL 3 MG MT LOZG: 1.0000 | LOZENGE | OROMUCOSAL | Status: DC | PRN

## 2017-07-15 MED ORDER — METHOCARBAMOL 1000 MG/10ML IJ SOLN
500.0000 mg | Freq: Four times a day (QID) | INTRAVENOUS | Status: DC | PRN
  Administered 2017-07-15: 500 mg via INTRAVENOUS
  Filled 2017-07-15: qty 550

## 2017-07-15 MED ORDER — DOCUSATE SODIUM 100 MG PO CAPS: 100.0000 mg | ORAL_CAPSULE | Freq: Two times a day (BID) | ORAL | 0 refills | Status: DC

## 2017-07-15 MED ORDER — CHLORHEXIDINE GLUCONATE 4 % EX LIQD: 60.0000 mL | Freq: Once | CUTANEOUS | Status: DC

## 2017-07-15 MED ORDER — PROMETHAZINE HCL 25 MG/ML IJ SOLN: 6.2500 mg | INTRAMUSCULAR | Status: DC | PRN

## 2017-07-15 MED ORDER — FENTANYL CITRATE (PF) 100 MCG/2ML IJ SOLN
INTRAMUSCULAR | Status: DC | PRN
  Administered 2017-07-15: 100 ug via INTRAVENOUS

## 2017-07-15 MED ORDER — SODIUM CHLORIDE 0.9 % IV SOLN
INTRAVENOUS | Status: DC
  Administered 2017-07-15: 100 mL/h via INTRAVENOUS

## 2017-07-15 MED ORDER — BUPIVACAINE IN DEXTROSE 0.75-8.25 % IT SOLN
INTRATHECAL | Status: DC | PRN
  Administered 2017-07-15: 2 mL via INTRATHECAL

## 2017-07-15 MED ORDER — CARVEDILOL 12.5 MG PO TABS
12.5000 mg | ORAL_TABLET | Freq: Two times a day (BID) | ORAL | Status: DC
  Administered 2017-07-15 – 2017-07-16 (×2): 12.5 mg via ORAL
  Filled 2017-07-15 (×2): qty 1

## 2017-07-15 MED ORDER — BISACODYL 10 MG RE SUPP: 10.0000 mg | Freq: Every day | RECTAL | Status: DC | PRN

## 2017-07-15 MED ORDER — DEXAMETHASONE SODIUM PHOSPHATE 10 MG/ML IJ SOLN
INTRAMUSCULAR | Status: AC
  Filled 2017-07-15: qty 1

## 2017-07-15 MED ORDER — ACETAMINOPHEN 10 MG/ML IV SOLN
INTRAVENOUS | Status: DC | PRN
  Administered 2017-07-15: 1000 mg via INTRAVENOUS

## 2017-07-15 MED ORDER — CELECOXIB 200 MG PO CAPS
200.0000 mg | ORAL_CAPSULE | Freq: Two times a day (BID) | ORAL | Status: DC
  Administered 2017-07-15 – 2017-07-16 (×2): 200 mg via ORAL
  Filled 2017-07-15 (×2): qty 1

## 2017-07-15 MED ORDER — ACETAMINOPHEN 10 MG/ML IV SOLN
INTRAVENOUS | Status: AC
  Filled 2017-07-15: qty 100

## 2017-07-15 MED ORDER — METHOCARBAMOL 500 MG PO TABS: 500.0000 mg | ORAL_TABLET | Freq: Four times a day (QID) | ORAL | 0 refills | Status: DC | PRN

## 2017-07-15 MED ORDER — HYDROCODONE-ACETAMINOPHEN 7.5-325 MG PO TABS
1.0000 | ORAL_TABLET | ORAL | Status: DC
  Administered 2017-07-15 – 2017-07-16 (×4): 1 via ORAL
  Administered 2017-07-16: 13:00:00 2 via ORAL
  Administered 2017-07-16 (×2): 1 via ORAL
  Filled 2017-07-15: qty 1
  Filled 2017-07-15: qty 2
  Filled 2017-07-15 (×5): qty 1

## 2017-07-15 MED ORDER — ALUM & MAG HYDROXIDE-SIMETH 200-200-20 MG/5ML PO SUSP: 15.0000 mL | ORAL | Status: DC | PRN

## 2017-07-15 MED ORDER — MIDAZOLAM HCL 2 MG/2ML IJ SOLN
INTRAMUSCULAR | Status: AC
  Filled 2017-07-15: qty 2

## 2017-07-15 MED ORDER — SODIUM CHLORIDE 0.9 % IJ SOLN
INTRAMUSCULAR | Status: DC | PRN
  Administered 2017-07-15: 30 mL

## 2017-07-15 MED ORDER — HYDROMORPHONE HCL-NACL 0.5-0.9 MG/ML-% IV SOSY: 0.2500 mg | PREFILLED_SYRINGE | INTRAVENOUS | Status: DC | PRN

## 2017-07-15 MED ORDER — ONDANSETRON HCL 4 MG PO TABS: 4.0000 mg | ORAL_TABLET | Freq: Four times a day (QID) | ORAL | Status: DC | PRN

## 2017-07-15 MED ORDER — FENTANYL CITRATE (PF) 100 MCG/2ML IJ SOLN: 25.0000 ug | INTRAMUSCULAR | Status: DC | PRN

## 2017-07-15 MED ORDER — ONDANSETRON HCL 4 MG/2ML IJ SOLN: 4.0000 mg | Freq: Four times a day (QID) | INTRAMUSCULAR | Status: DC | PRN

## 2017-07-15 MED ORDER — FERROUS SULFATE 325 (65 FE) MG PO TABS: 325.0000 mg | ORAL_TABLET | Freq: Three times a day (TID) | ORAL | Status: DC

## 2017-07-15 MED ORDER — HYDROCODONE-ACETAMINOPHEN 7.5-325 MG PO TABS: 1.0000 | ORAL_TABLET | ORAL | 0 refills | Status: DC | PRN

## 2017-07-15 MED ORDER — ROPIVACAINE HCL 5 MG/ML IJ SOLN
INTRAMUSCULAR | Status: DC | PRN
  Administered 2017-07-15: 30 mL via PERINEURAL

## 2017-07-15 MED ORDER — FUROSEMIDE 20 MG PO TABS
20.0000 mg | ORAL_TABLET | Freq: Every day | ORAL | Status: DC
  Administered 2017-07-16: 11:00:00 20 mg via ORAL
  Filled 2017-07-15: qty 1

## 2017-07-15 MED ORDER — MAGNESIUM CITRATE PO SOLN: 1.0000 | Freq: Once | ORAL | Status: DC | PRN

## 2017-07-15 MED ORDER — DOCUSATE SODIUM 100 MG PO CAPS
100.0000 mg | ORAL_CAPSULE | Freq: Two times a day (BID) | ORAL | Status: DC
  Administered 2017-07-16: 11:00:00 100 mg via ORAL
  Filled 2017-07-15 (×2): qty 1

## 2017-07-15 MED ORDER — LOSARTAN POTASSIUM 25 MG PO TABS
25.0000 mg | ORAL_TABLET | Freq: Every day | ORAL | Status: DC
  Administered 2017-07-16: 25 mg via ORAL
  Filled 2017-07-15: qty 1

## 2017-07-15 MED ORDER — DEXAMETHASONE SODIUM PHOSPHATE 10 MG/ML IJ SOLN
10.0000 mg | Freq: Once | INTRAMUSCULAR | Status: DC
  Filled 2017-07-15: qty 1

## 2017-07-15 MED ORDER — CEFAZOLIN SODIUM-DEXTROSE 2-4 GM/100ML-% IV SOLN
2.0000 g | INTRAVENOUS | Status: AC
  Administered 2017-07-15: 2 g via INTRAVENOUS

## 2017-07-15 MED ORDER — DEXAMETHASONE SODIUM PHOSPHATE 10 MG/ML IJ SOLN
10.0000 mg | Freq: Once | INTRAMUSCULAR | Status: AC
  Administered 2017-07-15: 10 mg via INTRAVENOUS

## 2017-07-15 MED ORDER — POLYETHYLENE GLYCOL 3350 17 G PO PACK
17.0000 g | PACK | Freq: Two times a day (BID) | ORAL | Status: DC
  Filled 2017-07-15: qty 1

## 2017-07-15 MED ORDER — POLYETHYLENE GLYCOL 3350 17 G PO PACK: 17.0000 g | PACK | Freq: Every day | ORAL | 0 refills | Status: DC

## 2017-07-15 MED ORDER — PHENYLEPHRINE 40 MCG/ML (10ML) SYRINGE FOR IV PUSH (FOR BLOOD PRESSURE SUPPORT)
PREFILLED_SYRINGE | INTRAVENOUS | Status: AC
  Filled 2017-07-15: qty 10

## 2017-07-15 MED ORDER — ASPIRIN 81 MG PO CHEW
81.0000 mg | CHEWABLE_TABLET | Freq: Two times a day (BID) | ORAL | Status: DC
  Administered 2017-07-15 – 2017-07-16 (×2): 81 mg via ORAL
  Filled 2017-07-15 (×2): qty 1

## 2017-07-15 MED ORDER — DIPHENHYDRAMINE HCL 25 MG PO CAPS: 25.0000 mg | ORAL_CAPSULE | Freq: Four times a day (QID) | ORAL | Status: DC | PRN

## 2017-07-15 MED ORDER — LIDOCAINE HCL (CARDIAC) 20 MG/ML IV SOLN
INTRAVENOUS | Status: DC | PRN
  Administered 2017-07-15: 50 mg via INTRAVENOUS

## 2017-07-15 MED ORDER — FENTANYL CITRATE (PF) 100 MCG/2ML IJ SOLN
INTRAMUSCULAR | Status: AC
  Filled 2017-07-15: qty 2

## 2017-07-15 MED ORDER — KETOROLAC TROMETHAMINE 30 MG/ML IJ SOLN
INTRAMUSCULAR | Status: AC
  Filled 2017-07-15: qty 1

## 2017-07-15 SURGICAL SUPPLY — 49 items
ADH SKN CLS APL DERMABOND .7 (GAUZE/BANDAGES/DRESSINGS) ×1
BAG DECANTER FOR FLEXI CONT (MISCELLANEOUS) IMPLANT
BAG SPEC THK2 15X12 ZIP CLS (MISCELLANEOUS)
BAG ZIPLOCK 12X15 (MISCELLANEOUS) IMPLANT
BANDAGE ACE 6X5 VEL STRL LF (GAUZE/BANDAGES/DRESSINGS) ×3 IMPLANT
BLADE SAW SGTL 11.0X1.19X90.0M (BLADE) IMPLANT
BLADE SAW SGTL 13.0X1.19X90.0M (BLADE) ×3 IMPLANT
BOWL SMART MIX CTS (DISPOSABLE) ×3 IMPLANT
CAPT KNEE TOTAL 3 ATTUNE ×2 IMPLANT
CEMENT HV SMART SET (Cement) ×4 IMPLANT
COVER SURGICAL LIGHT HANDLE (MISCELLANEOUS) ×3 IMPLANT
CUFF TOURN SGL QUICK 34 (TOURNIQUET CUFF) ×3
CUFF TRNQT CYL 34X4X40X1 (TOURNIQUET CUFF) ×1 IMPLANT
DECANTER SPIKE VIAL GLASS SM (MISCELLANEOUS) ×3 IMPLANT
DERMABOND ADVANCED (GAUZE/BANDAGES/DRESSINGS) ×2
DERMABOND ADVANCED .7 DNX12 (GAUZE/BANDAGES/DRESSINGS) ×1 IMPLANT
DRAPE U-SHAPE 47X51 STRL (DRAPES) ×3 IMPLANT
DRESSING AQUACEL AG SP 3.5X10 (GAUZE/BANDAGES/DRESSINGS) ×1 IMPLANT
DRSG AQUACEL AG ADV 3.5X10 (GAUZE/BANDAGES/DRESSINGS) ×2 IMPLANT
DRSG AQUACEL AG SP 3.5X10 (GAUZE/BANDAGES/DRESSINGS) ×3
DURAPREP 26ML APPLICATOR (WOUND CARE) ×6 IMPLANT
ELECT REM PT RETURN 15FT ADLT (MISCELLANEOUS) ×3 IMPLANT
GLOVE BIOGEL M 7.0 STRL (GLOVE) IMPLANT
GLOVE BIOGEL PI IND STRL 7.5 (GLOVE) ×1 IMPLANT
GLOVE BIOGEL PI IND STRL 8.5 (GLOVE) ×1 IMPLANT
GLOVE BIOGEL PI INDICATOR 7.5 (GLOVE) ×2
GLOVE BIOGEL PI INDICATOR 8.5 (GLOVE) ×2
GLOVE ECLIPSE 8.0 STRL XLNG CF (GLOVE) ×3 IMPLANT
GLOVE ORTHO TXT STRL SZ7.5 (GLOVE) ×6 IMPLANT
GOWN STRL REUS W/TWL LRG LVL3 (GOWN DISPOSABLE) ×3 IMPLANT
GOWN STRL REUS W/TWL XL LVL3 (GOWN DISPOSABLE) ×3 IMPLANT
HANDPIECE INTERPULSE COAX TIP (DISPOSABLE) ×3
MANIFOLD NEPTUNE II (INSTRUMENTS) ×3 IMPLANT
PACK TOTAL KNEE CUSTOM (KITS) ×3 IMPLANT
POSITIONER SURGICAL ARM (MISCELLANEOUS) ×3 IMPLANT
SET HNDPC FAN SPRY TIP SCT (DISPOSABLE) ×1 IMPLANT
SET PAD KNEE POSITIONER (MISCELLANEOUS) ×3 IMPLANT
SUT MNCRL AB 4-0 PS2 18 (SUTURE) ×3 IMPLANT
SUT STRATAFIX 0 PDS 27 VIOLET (SUTURE)
SUT VIC AB 1 CT1 36 (SUTURE) ×3 IMPLANT
SUT VIC AB 2-0 CT1 27 (SUTURE) ×9
SUT VIC AB 2-0 CT1 TAPERPNT 27 (SUTURE) ×3 IMPLANT
SUT VLOC 180 0 24IN GS25 (SUTURE) ×2 IMPLANT
SUTURE STRATFX 0 PDS 27 VIOLET (SUTURE) IMPLANT
SYR 50ML LL SCALE MARK (SYRINGE) ×1 IMPLANT
TRAY FOLEY W/METER SILVER 16FR (SET/KITS/TRAYS/PACK) ×3 IMPLANT
WATER STERILE IRR 1500ML POUR (IV SOLUTION) ×5 IMPLANT
WRAP KNEE MAXI GEL POST OP (GAUZE/BANDAGES/DRESSINGS) ×3 IMPLANT
YANKAUER SUCT BULB TIP 10FT TU (MISCELLANEOUS) ×3 IMPLANT

## 2017-07-15 NOTE — Evaluation (Signed)
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 Hx of CABG.  Clinical Impression  On eval, pt required Min assist for mobility. He walked ~50 feet with a RW. Distance limited by pain. Will progress activity as tolerated. Pt stated he plans to d/c home tomorrow.     Follow Up Recommendations DC plan and follow up therapy as arranged by surgeon (outpatient)    Equipment Recommendations  None recommended by PT    Recommendations for Other Services       Precautions / Restrictions Precautions Precautions: Fall Restrictions Weight Bearing Restrictions: No LLE Weight Bearing: Weight bearing as tolerated      Mobility  Bed Mobility Overal bed mobility: Needs Assistance Bed Mobility: Supine to Sit     Supine to sit: Min assist;HOB elevated     General bed mobility comments: Assist for R knee. Increased time. VCs technique.   Transfers Overall transfer level: Needs assistance Equipment used: Rolling walker (2 wheeled) Transfers: Sit to/from Stand Sit to Stand: Min assist;From elevated surface         General transfer comment: Assist to rise, stabilize, control descent. VCs safety, technique, hand/LE placement.   Ambulation/Gait Ambulation/Gait assistance: Min assist Ambulation Distance (Feet): 50 Feet Assistive device: Rolling walker (2 wheeled) Gait Pattern/deviations: Step-to pattern;Antalgic     General Gait Details: VCs safety, technique, sequence. Assist to stabilize.   Stairs            Wheelchair Mobility    Modified Rankin (Stroke Patients Only)       Balance                                             Pertinent Vitals/Pain Pain Assessment: 0-10 Pain Score: 8  Pain Location: L knee Pain Descriptors / Indicators: Aching;Sore Pain Intervention(s): Monitored during session;Repositioned;Ice  applied    Home Living Family/patient expects to be discharged to:: Private residence Living Arrangements: Spouse/significant other   Type of Home: Apartment Home Access: Stairs to enter Entrance Stairs-Rails: Right Entrance Stairs-Number of Steps: 1 flight Home Layout: One level Home Equipment: Environmental consultant - 2 wheels;Crutches      Prior Function Level of Independence: Independent               Hand Dominance        Extremity/Trunk Assessment   Upper Extremity Assessment Upper Extremity Assessment: Defer to OT evaluation    Lower Extremity Assessment Lower Extremity Assessment: Generalized weakness (s/p L TKA)    Cervical / Trunk Assessment Cervical / Trunk Assessment: Normal  Communication   Communication: No difficulties  Cognition Arousal/Alertness: Awake/alert Behavior During Therapy: WFL for tasks assessed/performed Overall Cognitive Status: Within Functional Limits for tasks assessed                                        General Comments      Exercises     Assessment/Plan    PT Assessment Patient needs continued PT services  PT Problem List Decreased strength;Decreased mobility;Decreased range of motion;Decreased activity tolerance;Decreased balance;Decreased knowledge of use of DME;Pain       PT Treatment Interventions DME instruction;Therapeutic activities;Gait training;Therapeutic exercise;Patient/family education;Balance training;Functional  mobility training;Stair training    PT Goals (Current goals can be found in the Care Plan section)  Acute Rehab PT Goals Patient Stated Goal: regain plof. less pain. PT Goal Formulation: With patient Time For Goal Achievement: 07/29/17 Potential to Achieve Goals: Good    Frequency 7X/week   Barriers to discharge        Co-evaluation               AM-PAC PT "6 Clicks" Daily Activity  Outcome Measure Difficulty turning over in bed (including adjusting bedclothes, sheets and  blankets)?: Total Difficulty moving from lying on back to sitting on the side of the bed? : Total Difficulty sitting down on and standing up from a chair with arms (e.g., wheelchair, bedside commode, etc,.)?: A Little Help needed moving to and from a bed to chair (including a wheelchair)?: A Little Help needed walking in hospital room?: A Little Help needed climbing 3-5 steps with a railing? : A Little 6 Click Score: 14    End of Session Equipment Utilized During Treatment: Gait belt Activity Tolerance: Patient limited by fatigue;Patient limited by pain Patient left: in chair;with call bell/phone within reach   PT Visit Diagnosis: Muscle weakness (generalized) (M62.81);Difficulty in walking, not elsewhere classified (R26.2)    Time: [id number] PT Time Calculation (min) (ACUTE ONLY): 16 min   Charges:   PT Evaluation $PT Eval Low Complexity: 1 Procedure     PT G Codes:          Rebeca AlertJannie Kylyn Mcdade, MPT Pager: [phone]

## 2017-07-15 NOTE — Anesthesia Postprocedure Evaluation (Signed)
Anesthesia Post Note  Patient: Luis Tucker  Procedure(s) Performed: Procedure(s) (LRB): LEFT TOTAL KNEE ARTHROPLASTY (Left)     Patient location during evaluation: PACU Anesthesia Type: Spinal Level of consciousness: oriented and awake and alert Pain management: pain level controlled Vital Signs Assessment: post-procedure vital signs reviewed and stable Respiratory status: spontaneous breathing, respiratory function stable and patient connected to nasal cannula oxygen Cardiovascular status: blood pressure returned to baseline and stable Postop Assessment: no headache and no backache Anesthetic complications: no    Last Vitals:  Vitals:   07/15/17 1015 07/15/17 1030  BP: 108/71 109/82  Pulse: 68 (!) 55  Resp: 13 14  Temp:      Last Pain:  Vitals:   07/15/17 0704  TempSrc: Oral                 Lateisha Thurlow S

## 2017-07-15 NOTE — Transfer of Care (Signed)
Immediate Anesthesia Transfer of Care Note  Patient: Luis Tucker  Procedure(s) Performed: Procedure(s) with comments: LEFT TOTAL KNEE ARTHROPLASTY (Left) - 90 mins   Patient Location: PACU  Anesthesia Type:Spinal  Level of Consciousness:  sedated, patient cooperative and responds to stimulation  Airway & Oxygen Therapy:Patient Spontanous Breathing and Patient connected to face mask oxgen  Post-op Assessment:  Report given to PACU RN and Post -op Vital signs reviewed and stable  Post vital signs:  Reviewed and stable  Last Vitals:  Vitals:   07/15/17 0743 07/15/17 0745  BP: (!) 136/91   Pulse: 71 62  Resp: 12 19  Temp:      Complications: No apparent anesthesia complications

## 2017-07-15 NOTE — Anesthesia Procedure Notes (Signed)
Spinal  Patient location during procedure: OR Start time: 07/15/2017 8:02 AM End time: 07/15/2017 8:02 AM Staffing Anesthesiologist: Akemi Overholser, Iona Beard Performed: anesthesiologist  Preanesthetic Checklist Completed: patient identified, site marked, surgical consent, pre-op evaluation, timeout performed, IV checked, risks and benefits discussed and monitors and equipment checked Spinal Block Patient position: sitting Prep: Betadine Patient monitoring: heart rate, continuous pulse ox and blood pressure Location: L3-4 Injection technique: single-shot Needle Needle type: Sprotte  Needle gauge: 24 G Needle length: 9 cm Additional Notes Expiration date of kit checked and confirmed. Patient tolerated procedure well, without complications.

## 2017-07-15 NOTE — Anesthesia Procedure Notes (Signed)
Anesthesia Procedure Image    

## 2017-07-15 NOTE — Progress Notes (Signed)
Assisted Dr. Rose with left, ultrasound guided, knee, adductor canal block. Side rails up, monitors on throughout procedure. See vital signs in flow sheet. Tolerated Procedure well.  

## 2017-07-15 NOTE — Anesthesia Procedure Notes (Signed)
Anesthesia Regional Block: Adductor canal block   Pre-Anesthetic Checklist: ,, timeout performed, Correct Patient, Correct Site, Correct Laterality, Correct Procedure, Correct Position, site marked, Risks and benefits discussed,  Surgical consent,  Pre-op evaluation,  At surgeon's request and post-op pain management  Laterality: Left  Prep: chloraprep       Needles:  Injection technique: Single-shot  Needle Type: Echogenic Needle     Needle Length: 9cm      Additional Needles:   Procedures: ultrasound guided,,,,,,,,  Narrative:  Start time: 07/15/2017 7:39 AM End time: 07/15/2017 7:45 AM Injection made incrementally with aspirations every 5 mL.  Performed by: Personally  Anesthesiologist: Jaiden Dinkins  Additional Notes: Patient tolerated the procedure well without complications

## 2017-07-15 NOTE — Addendum Note (Signed)
Addendum  created 07/15/17 0953 by Eilene Ghaziose, Jaleeyah Munce, MD   Delete clinical note

## 2017-07-15 NOTE — Interval H&P Note (Signed)
History and Physical Interval Note:  07/15/2017 7:27 AM  Luis Tucker  has presented today for surgery, with the diagnosis of Left knee osteoarthritis  The various methods of treatment have been discussed with the patient and family. After consideration of risks, benefits and other options for treatment, the patient has consented to  Procedure(s) with comments: LEFT TOTAL KNEE ARTHROPLASTY (Left) - 90 mins  as a surgical intervention .  The patient's history has been reviewed, patient examined, no change in status, stable for surgery.  I have reviewed the patient's chart and labs.  Questions were answered to the patient's satisfaction.     Shelda PalLIN,Latiesha Harada D

## 2017-07-15 NOTE — Op Note (Signed)
NAME:  Luis Tucker                      MEDICAL RECORD NO.:  [id number]                             FACILITY:  [facility]      PHYSICIAN:  Madlyn Frankel. Charlann Boxer, M.D.  DATE OF BIRTH:  1951/12/15      DATE OF PROCEDURE:  07/15/2017                                     OPERATIVE REPORT         PREOPERATIVE DIAGNOSIS:  Left knee osteoarthritis.      POSTOPERATIVE DIAGNOSIS:  Left knee osteoarthritis.      FINDINGS:  The patient was noted to have complete loss of cartilage and   bone-on-bone arthritis with associated osteophytes in the medial and patellofemoral compartments of   the knee with a significant synovitis and associated effusion.      PROCEDURE:  Left total knee replacement.      COMPONENTS USED:  DePuy Attune rotating platform posterior stabilized knee   system, a size 6 femur, 7 tibia, size 8 PS AOX insert, and 38 anatomic patellar   button.      SURGEON:  Madlyn Frankel. Charlann Boxer, M.D.      ASSISTANT:  Lanney Gins, PA-C.      ANESTHESIA:  Regional and Spinal.      SPECIMENS:  None.      COMPLICATION:  None.      DRAINS:  None.  EBL: <100      TOURNIQUET TIME:   Total Tourniquet Time Documented: Thigh (Left) - 32 minutes Total: Thigh (Left) - 32 minutes  .      The patient was stable to the recovery room.      INDICATION FOR PROCEDURE:  Luis Tucker is a 66 y.o. male patient of   mine.  The patient had been seen, evaluated, and treated conservatively in the   office with medication, activity modification, and injections.  The patient had   radiographic changes of bone-on-bone arthritis with endplate sclerosis and osteophytes noted.      The patient failed conservative measures including medication, injections, and activity modification, and at this point was ready for more definitive measures.   Based on the radiographic changes and failed conservative measures, the patient   decided to proceed with total knee replacement.  Risks of infection,   DVT, component failure,  need for revision surgery, postop course, and   expectations were all   discussed and reviewed.  Consent was obtained for benefit of pain   relief.      PROCEDURE IN DETAIL:  The patient was brought to the operative theater.   Once adequate anesthesia, preoperative antibiotics, 2 gm of Ancef, 1 gm of Tranexamic Acid, and 10 mg of Decadorn administered, the patient was positioned supine with the left thigh tourniquet placed.  The  left lower extremity was prepped and draped in sterile fashion.  A time-   out was performed identifying the patient, planned procedure, and   extremity.      The left lower extremity was placed in the [facility] leg holder.  The leg was   exsanguinated, tourniquet elevated to 250 mmHg.  A midline incision was  made followed by median parapatellar arthrotomy.  Following initial   exposure, attention was first directed to the patella.  Precut   measurement was noted to be 26 mm.  I resected down to 14 mm and used a   38 anatomic patellar button to restore patellar height as well as cover the cut   surface.      The lug holes were drilled and a metal shim was placed to protect the   patella from retractors and saw blades.      At this point, attention was now directed to the femur.  The femoral   canal was opened with a drill, irrigated to try to prevent fat emboli.  An   intramedullary rod was passed at 3 degrees valgus, 8 mm of bone was   resected off the distal femur due to slight hyperextension.  Following this resection, the tibia was   subluxated anteriorly.  Using the extramedullary guide, 2 mm of bone was resected off   the proximal medial tibia.  We confirmed the gap would be   stable medially and laterally with a size 6 spacer block as well as confirmed   the cut was perpendicular in the coronal plane, checking with an alignment rod.      Once this was done, I sized the femur to be a size 6 in the anterior-   posterior dimension, chose a standard  component based on medial and   lateral dimension.  The size 6 rotation block was then pinned in   position anterior referenced using the C-clamp to set rotation.  The   anterior, posterior, and  chamfer cuts were made without difficulty nor   notching making certain that I was along the anterior cortex to help   with flexion gap stability.      The final box cut was made off the lateral aspect of distal femur.      At this point, the tibia was sized to be a size 7, the size 7 tray was   then pinned in position through the medial third of the tubercle,   drilled, and keel punched.  Trial reduction was now carried with a 6 femur,  7 tibia, a size 6 up to the 8 PS insert, and the 38 anatomic patella botton.  The knee was brought to   extension, full extension with good flexion stability with the patella   tracking through the trochlea without application of pressure.  Given   all these findings the femoral lug holes were drilled and then the trial components removed.  Sclerotic bone was drilled for cement interdigitation.  Final components were   opened and cement was mixed.  The knee was irrigated with normal saline   solution and pulse lavage.  The synovial lining was   then injected with 30 cc of  0.25% Marcaine with epinephrine and 1 cc of Toradol plus 30 cc of NS for a   total of 61 cc.      The knee was irrigated.  Final implants were then cemented onto clean and   dried cut surfaces of bone with the knee brought to extension with a size 8 PS trial insert.      Once the cement had fully cured, the excess cement was removed   throughout the knee.  I confirmed I was satisfied with the range of   motion and stability, and the final size 8 PS AOX insert was chosen.  It was  placed into the knee.      The tourniquet had been let down at 32 minutes.  No significant   hemostasis required.  The   extensor mechanism was then reapproximated using #1 Vicryl and #0 V-lock sutures with the  knee   in flexion.  The   remaining wound was closed with 2-0 Vicryl and running 4-0 Monocryl.   The knee was cleaned, dried, dressed sterilely using Dermabond and   Aquacel dressing.  The patient was then   brought to recovery room in stable condition, tolerating the procedure   well.   Please note that Physician Assistant, Lanney Gins, PA-C, was present for the entirety of the case, and was utilized for pre-operative positioning, peri-operative retractor management, general facilitation of the procedure.  He was also utilized for primary wound closure at the end of the case.              Madlyn Frankel Charlann Boxer, M.D.    07/15/2017 9:12 AM

## 2017-07-15 NOTE — Anesthesia Preprocedure Evaluation (Signed)
Anesthesia Evaluation  Patient identified by MRN, date of birth, ID band Patient awake    Reviewed: Allergy & Precautions, NPO status , Patient's Chart, lab work & pertinent test results  Airway Mallampati: II  TM Distance: >3 FB Neck ROM: Full    Dental no notable dental hx.    Pulmonary neg pulmonary ROS, former smoker,    Pulmonary exam normal breath sounds clear to auscultation       Cardiovascular hypertension, + CAD and + CABG  Normal cardiovascular exam Rhythm:Regular Rate:Normal     Neuro/Psych negative neurological ROS  negative psych ROS   GI/Hepatic negative GI ROS, Neg liver ROS,   Endo/Other  negative endocrine ROS  Renal/GU Renal InsufficiencyRenal disease  negative genitourinary   Musculoskeletal negative musculoskeletal ROS (+)   Abdominal   Peds negative pediatric ROS (+)  Hematology negative hematology ROS (+)   Anesthesia Other Findings   Reproductive/Obstetrics negative OB ROS                             Anesthesia Physical Anesthesia Plan  ASA: III  Anesthesia Plan: Spinal   Post-op Pain Management:  Regional for Post-op pain   Induction: Intravenous  PONV Risk Score and Plan: 1 and Ondansetron and Dexamethasone  Airway Management Planned: Simple Face Mask  Additional Equipment:   Intra-op Plan:   Post-operative Plan: Extubation in OR  Informed Consent: I have reviewed the patients History and Physical, chart, labs and discussed the procedure including the risks, benefits and alternatives for the proposed anesthesia with the patient or authorized representative who has indicated his/her understanding and acceptance.   Dental advisory given  Plan Discussed with: CRNA and Surgeon  Anesthesia Plan Comments:         Anesthesia Quick Evaluation

## 2017-07-15 NOTE — Anesthesia Procedure Notes (Signed)
Procedure Name: MAC Date/Time: 07/15/2017 8:00 AM Performed by: Claudia Desanctis Oxygen Delivery Method: Simple face mask

## 2017-07-15 NOTE — Discharge Instructions (Signed)

## 2017-07-15 NOTE — Addendum Note (Signed)
Addendum  created 07/15/17 1042 by Eilene Ghaziose, Sherree Shankman, MD   Sign clinical note

## 2017-07-15 NOTE — Anesthesia Postprocedure Evaluation (Deleted)
Anesthesia Post Note  Patient: Luis Tucker  Procedure(s) Performed: Procedure(s) (LRB): LEFT TOTAL KNEE ARTHROPLASTY (Left)     Patient location during evaluation: PACU Anesthesia Type: Spinal Level of consciousness: oriented and awake and alert Pain management: pain level controlled Vital Signs Assessment: post-procedure vital signs reviewed and stable Respiratory status: spontaneous breathing, respiratory function stable and patient connected to nasal cannula oxygen Cardiovascular status: blood pressure returned to baseline and stable Postop Assessment: no headache and no backache Anesthetic complications: no    Last Vitals:  Vitals:   07/15/17 0941 07/15/17 0945  BP: (!) 87/70 95/73  Pulse: (!) 26   Resp: 17 (!) 23  Temp:  (P) 36.6 C    Last Pain:  Vitals:   07/15/17 0704  TempSrc: Oral    LLE Motor Response: (P) No movement due to regional block (07/15/17 0945) LLE Sensation: (P) No sensation (absent) (07/15/17 0945) RLE Motor Response: (P) No movement due to regional block (07/15/17 0945) RLE Sensation: (P) No sensation (absent) (07/15/17 0945) L Sensory Level: (P) L1-Inguinal (groin) region (07/15/17 0945) R Sensory Level: (P) L1-Inguinal (groin) region (07/15/17 0945)  Lucilia Yanni S

## 2017-07-16 DIAGNOSIS — M1712 Unilateral primary osteoarthritis, left knee: Secondary | ICD-10-CM | POA: Diagnosis not present

## 2017-07-16 LAB — CBC
HCT: 37.6 % — ABNORMAL LOW (ref 39.0–52.0)
Hemoglobin: 12.4 g/dL — ABNORMAL LOW (ref 13.0–17.0)
MCH: 27.1 pg (ref 26.0–34.0)
MCHC: 33 g/dL (ref 30.0–36.0)
MCV: 82.1 fL (ref 78.0–100.0)
Platelets: 159 10*3/uL (ref 150–400)
RBC: 4.58 MIL/uL (ref 4.22–5.81)
RDW: 13.9 % (ref 11.5–15.5)
WBC: 17.3 10*3/uL — ABNORMAL HIGH (ref 4.0–10.5)

## 2017-07-16 LAB — BASIC METABOLIC PANEL
Anion gap: 9 (ref 5–15)
BUN: 19 mg/dL (ref 6–20)
CALCIUM: 8.5 mg/dL — AB (ref 8.9–10.3)
CO2: 26 mmol/L (ref 22–32)
CREATININE: 1.16 mg/dL (ref 0.61–1.24)
Chloride: 104 mmol/L (ref 101–111)
GFR calc non Af Amer: >60 mL/min (ref >60)
Glucose, Bld: 124 mg/dL — ABNORMAL HIGH (ref 65–99)
Potassium: 3.8 mmol/L (ref 3.5–5.1)
SODIUM: 139 mmol/L (ref 135–145)

## 2017-07-16 NOTE — Progress Notes (Signed)
     Subjective: 1 Day Post-Op Procedure(s) (LRB): LEFT TOTAL KNEE ARTHROPLASTY (Left)   Seen by Dr. Charlann Boxerlin. Patient reports pain as mild, pain controlled. No events throughout the night.  Ready to work with PT.  Ready to be discharged home if he does well with PT and pain is controlled.   Objective:   VITALS:   Vitals:   07/16/17 0531 07/16/17 0720  BP: 121/83 121/75  Pulse: 69 69  Resp: 17   Temp: 98.2 F (36.8 C)     Dorsiflexion/Plantar flexion intact Incision: dressing C/D/I No cellulitis present Compartment soft  LABS  Recent Labs  07/16/17 0515  HGB 12.4*  HCT 37.6*  WBC 17.3*  PLT 159     Recent Labs  07/16/17 0515  NA 139  K 3.8  BUN 19  CREATININE 1.16  GLUCOSE 124*     Assessment/Plan: 1 Day Post-Op Procedure(s) (LRB): LEFT TOTAL KNEE ARTHROPLASTY (Left) Foley cath d/c'ed Advance diet Up with therapy D/C IV fluids Discharge home [facility]. Follow up with OLIN,Sherrie Marsan D in 2 weeks.  Contact information:  [facility] [address], [address] [place] [id number] [phone]        Anastasio AuerbachMatthew S. Zaivion Kundrat   PAC  07/16/2017, 9:32 AM

## 2017-07-16 NOTE — Progress Notes (Signed)
Patient ID: Luis Tucker, male   DOB: 06/25/1951, 66 y.o.   MRN: [id number] Subjective: 1 Day Post-Op Procedure(s) (LRB): LEFT TOTAL KNEE ARTHROPLASTY (Left)    Patient reports pain as moderate.  No events, did get up with therapy yesterday  Objective:   VITALS:   Vitals:   07/16/17 1025 07/16/17 1039  BP: 116/64 120/78  Pulse: 62 60  Resp: 18   Temp: 98 F (36.7 C)     Neurovascular intact Incision: dressing C/D/I - left knee  LABS  Recent Labs  07/16/17 0515  HGB 12.4*  HCT 37.6*  WBC 17.3*  PLT 159     Recent Labs  07/16/17 0515  NA 139  K 3.8  BUN 19  CREATININE 1.16  GLUCOSE 124*    No results for input(s): LABPT, INR in the last 72 hours.   Assessment/Plan: 1 Day Post-Op Procedure(s) (LRB): LEFT TOTAL KNEE ARTHROPLASTY (Left)   Advance diet Up with therapy  Home today if continues to progress with therapy Reviewed post-op goals RTC in 2 weeks

## 2017-07-16 NOTE — Care Management Note (Signed)
Case Management Note  Patient Details  Name: Luis Tucker MRN: [id number] Date of Birth: 08/30/1951  Subjective/Objective: 66 y/o m admitted w/TKR. From home. Patient states he is already set up for otpt PT, has cane,rw,crutches. OT-recc 3n1-[facility] dme rep Brad aware of out of pocket pay for 3n1-to deliver to rm prior d/c. No further CM needs.                   Action/Plan:d/c home/dme   Expected Discharge Date:  07/16/17               Expected Discharge Plan:  OP Rehab  In-House Referral:     Discharge planning Services  CM Consult  Post Acute Care Choice:  Durable Medical Equipment (Has cane,rw) Choice offered to:  Patient  DME Arranged:  3-N-1 DME Agency:  [facility]  HH Arranged:    HH Agency:     Status of Service:  Completed, signed off  If discussed at Long Length of Stay Meetings, dates discussed:    Additional Comments:  Lanier ClamMahabir, Carianne Taira, RN 07/16/2017, 11:28 AM

## 2017-07-16 NOTE — Progress Notes (Signed)
Physical Therapy Treatment Patient Details Name: Luis BourbonLacy L Lyles MRN: [id number] DOB: 05/23/1951 Today's Date: 07/16/2017    History of Present Illness 66 yo male s/p L TKA 07/15/17. Hx of CABG.    PT Comments    Progressing with mobility. Pain rated 3/10 during session. Will plan to practice stair negotiation later today prior to d/c.    Follow Up Recommendations  DC plan and follow up therapy as arranged by surgeon (OP)     Equipment Recommendations  None recommended by PT    Recommendations for Other Services       Precautions / Restrictions Precautions Precautions: Fall;Knee Restrictions Weight Bearing Restrictions: No LLE Weight Bearing: Weight bearing as tolerated    Mobility  Bed Mobility Overal bed mobility: Needs Assistance Bed Mobility: Supine to Sit     Supine to sit: Min assist;HOB elevated     General bed mobility comments: Assist for L knee. Increased time. VCs technique.   Transfers Overall transfer level: Needs assistance Equipment used: Rolling walker (2 wheeled) Transfers: Sit to/from Stand Sit to Stand: Min guard         General transfer comment: close guard for safety. VCs safety, hand/LE placement  Ambulation/Gait Ambulation/Gait assistance: Min guard Ambulation Distance (Feet): 100 Feet Assistive device: Rolling walker (2 wheeled) Gait Pattern/deviations: Step-to pattern     General Gait Details: close guard for safety. VCs safety, sequence.    Stairs            Wheelchair Mobility    Modified Rankin (Stroke Patients Only)       Balance                                            Cognition Arousal/Alertness: Awake/alert Behavior During Therapy: WFL for tasks assessed/performed Overall Cognitive Status: Within Functional Limits for tasks assessed                                        Exercises Total Joint Exercises Ankle Circles/Pumps: AROM;Both;10 reps;Supine Quad Sets:  AROM;Left;10 reps;Supine Hip ABduction/ADduction: AAROM;Left;10 reps;Supine Straight Leg Raises: AAROM;Left;10 reps;Supine Knee Flexion: AAROM;Left;10 reps;Seated Goniometric ROM: ~5-65 degrees    General Comments        Pertinent Vitals/Pain Pain Assessment: 0-10 Pain Score: 3  Pain Location: L knee Pain Descriptors / Indicators: Aching;Sore Pain Intervention(s): Monitored during session;Repositioned;Ice applied    Home Living                      Prior Function            PT Goals (current goals can now be found in the care plan section) Progress towards PT goals: Progressing toward goals    Frequency    7X/week      PT Plan Current plan remains appropriate    Co-evaluation              AM-PAC PT "6 Clicks" Daily Activity  Outcome Measure  Difficulty turning over in bed (including adjusting bedclothes, sheets and blankets)?: A Lot Difficulty moving from lying on back to sitting on the side of the bed? : Total Difficulty sitting down on and standing up from a chair with arms (e.g., wheelchair, bedside commode, etc,.)?: A Little Help needed moving to and from a bed  to chair (including a wheelchair)?: A Little Help needed walking in hospital room?: A Little Help needed climbing 3-5 steps with a railing? : A Lot 6 Click Score: 14    End of Session Equipment Utilized During Treatment: Gait belt Activity Tolerance: Patient tolerated treatment well Patient left: in chair;with call bell/phone within reach   PT Visit Diagnosis: Muscle weakness (generalized) (M62.81);Difficulty in walking, not elsewhere classified (R26.2)     Time: 6606-3016 PT Time Calculation (min) (ACUTE ONLY): 35 min  Charges:  $Gait Training: 8-22 mins $Therapeutic Exercise: 8-22 mins                    G Codes:          Rebeca Alert, MPT Pager: [phone]

## 2017-07-16 NOTE — Evaluation (Signed)
Occupational Therapy Evaluation Patient Details Name: QUAMERE MUSSELL MRN: [id number] DOB: November 28, 1951 Today's Date: 07/16/2017    History of Present Illness 66 yo male s/p L TKA 07/15/17. Hx of CABG.   Clinical Impression   OT education complete.    Follow Up Recommendations  No OT follow up    Equipment Recommendations  3 in 1 bedside commode       Precautions / Restrictions Precautions Precautions: Fall;Knee Restrictions Weight Bearing Restrictions: No LLE Weight Bearing: Weight bearing as tolerated      Mobility Bed Mobility Overal bed mobility: Needs Assistance Bed Mobility: Supine to Sit     Supine to sit: Min assist;HOB elevated     General bed mobility comments: pt in chair  Transfers Overall transfer level: Needs assistance Equipment used: Rolling walker (2 wheeled) Transfers: Sit to/from Stand Sit to Stand: Supervision         General transfer comment: close guard for safety. VCs safety, hand/LE placement        ADL either performed or assessed with clinical judgement   ADL Overall ADL's : Needs assistance/impaired Eating/Feeding: Independent;Sitting   Grooming: Set up;Standing   Upper Body Bathing: Set up;Sitting   Lower Body Bathing: Minimal assistance;Sit to/from stand;Cueing for safety;Cueing for sequencing;Cueing for compensatory techniques   Upper Body Dressing : Set up;Sitting   Lower Body Dressing: Min guard;Sit to/from stand;Cueing for safety;Cueing for sequencing;Cueing for compensatory techniques   Toilet Transfer: Min guard;RW;Ambulation;Comfort height toilet   Toileting- Clothing Manipulation and Hygiene: Supervision/safety;Sit to/from stand;Cueing for safety;Cueing for sequencing   Tub/ Shower Transfer: Tub transfer;Minimal assistance;Rolling walker   Functional mobility during ADLs: Supervision/safety;Cueing for safety;Cueing for sequencing;Rolling walker       Vision Patient Visual Report: No change from baseline        Perception     Praxis      Pertinent Vitals/Pain Pain Assessment: 0-10 Pain Score: 3  Pain Location: L knee Pain Descriptors / Indicators: Aching;Sore Pain Intervention(s): Monitored during session;Repositioned;Ice applied     Hand Dominance     Extremity/Trunk Assessment Upper Extremity Assessment Upper Extremity Assessment: Overall WFL for tasks assessed           Communication Communication Communication: No difficulties   Cognition Arousal/Alertness: Awake/alert Behavior During Therapy: WFL for tasks assessed/performed Overall Cognitive Status: Within Functional Limits for tasks assessed                                     General Comments               Home Living Family/patient expects to be discharged to:: Private residence Living Arrangements: Spouse/significant other   Type of Home: Apartment Home Access: Stairs to enter [employer] of Steps: 1 flight Entrance Stairs-Rails: Right Home Layout: One level     Bathroom Shower/Tub: Chief Strategy Officer: Standard     Home Equipment: Environmental consultant - 2 wheels;Crutches          Prior Functioning/Environment Level of Independence: Independent                          OT Goals(Current goals can be found in the care plan section) Acute Rehab OT Goals Patient Stated Goal: regain plof. less pain.  OT Frequency:                AM-PAC PT "6 Clicks" Daily  Activity     Outcome Measure Help from another person eating meals?: None Help from another person taking care of personal grooming?: None Help from another person toileting, which includes using toliet, bedpan, or urinal?: A Little Help from another person bathing (including washing, rinsing, drying)?: A Little Help from another person to put on and taking off regular upper body clothing?: None Help from another person to put on and taking off regular lower body clothing?: A Little 6 Click Score: 21    End of Session Equipment Utilized During Treatment: Rolling walker  Activity Tolerance: Patient tolerated treatment well Patient left: in chair  OT Visit Diagnosis: Unsteadiness on feet (R26.81)                Time: 9147-82951011-1025 OT Time Calculation (min): 14 min Charges:  OT General Charges $OT Visit: 1 Procedure OT Evaluation $OT Eval Low Complexity: 1 Procedure G-Codes:     Lise AuerLori Shantanu Strauch, OT [phone]  Einar CrowEDDING, Othell Diluzio D 07/16/2017, 12:16 PM

## 2017-07-16 NOTE — Progress Notes (Addendum)
Physical Therapy Treatment Patient Details Name: Luis Tucker MRN: [id number] DOB: 1951-06-24 Today's Date: 07/16/2017    History of Present Illness 66 yo male s/p L TKA 07/15/17. Hx of CABG.    PT Comments    Progressing with mobility. 2nd session to practice stair negotiation. Issued HEP for pt to perform 2x day until he begins OP PT. All education completed. Ready to d/c from PT standpoint-made RN aware.    Follow Up Recommendations  DC plan and follow up therapy as arranged by surgeon (OP)     Equipment Recommendations  None recommended by PT    Recommendations for Other Services       Precautions / Restrictions Precautions Precautions: Fall;Knee Restrictions Weight Bearing Restrictions: No LLE Weight Bearing: Weight bearing as tolerated    Mobility  Bed Mobility Overal bed mobility: Needs Assistance           General bed mobility comments: pt in chair  Transfers Overall transfer level: Needs assistance Equipment used: Rolling walker (2 wheeled) Transfers: Sit to/from Stand Sit to Stand: Supervision         General transfer comment: close guard for safety. VCs safety, hand/LE placement  Ambulation/Gait Ambulation/Gait assistance: Min guard Ambulation Distance (Feet): 125 Feet Assistive device: Rolling walker (2 wheeled) Gait Pattern/deviations: Step-to pattern;Step-through pattern;Decreased stride length     General Gait Details: close guard for safety. VCs safety, sequence.    Stairs Stairs: Yes   Stair Management: One rail Left;Step to pattern;Forwards;With crutches Number of Stairs: 4 (x2) General stair comments: VCs safety, technique, sequence. small amount of assist to stabilize when descending. 1 crutch, 1 rail. Practiced 4 steps x 2.   Wheelchair Mobility    Modified Rankin (Stroke Patients Only)       Balance                                            Cognition Arousal/Alertness: Awake/alert Behavior During  Therapy: WFL for tasks assessed/performed Overall Cognitive Status: Within Functional Limits for tasks assessed                                        Exercises    General Comments        Pertinent Vitals/Pain Pain Assessment: 0-10 Pain Score: 3  Pain Location: L knee Pain Descriptors / Indicators: Aching;Sore Pain Intervention(s): Monitored during session;Repositioned;Ice applied    Home Living Family/patient expects to be discharged to:: Private residence Living Arrangements: Spouse/significant other   Type of Home: Apartment Home Access: Stairs to enter Entrance Stairs-Rails: Right Home Layout: One level Home Equipment: Environmental consultant - 2 wheels;Crutches      Prior Function Level of Independence: Independent          PT Goals (current goals can now be found in the care plan section) Acute Rehab PT Goals Patient Stated Goal: regain plof. less pain. Progress towards PT goals: Progressing toward goals    Frequency    7X/week      PT Plan Current plan remains appropriate    Co-evaluation              AM-PAC PT "6 Clicks" Daily Activity  Outcome Measure  Difficulty turning over in bed (including adjusting bedclothes, sheets and blankets)?: A Little Difficulty moving from lying on  back to sitting on the side of the bed? : A Little Difficulty sitting down on and standing up from a chair with arms (e.g., wheelchair, bedside commode, etc,.)?: A Little Help needed moving to and from a bed to chair (including a wheelchair)?: A Little Help needed walking in hospital room?: A Little Help needed climbing 3-5 steps with a railing? : A Little 6 Click Score: 18    End of Session Equipment Utilized During Treatment: Gait belt Activity Tolerance: Patient tolerated treatment well Patient left: in chair;with call bell/phone within reach   PT Visit Diagnosis: Muscle weakness (generalized) (M62.81);Difficulty in walking, not elsewhere classified (R26.2)      Time: [id number] PT Time Calculation (min) (ACUTE ONLY): 25 min  Charges:  $Gait Training: 23-37 mins $Therapeutic Exercise: 8-22 mins                    G Codes:          Rebeca AlertJannie Shawnte Demarest, MPT Pager: [phone]

## 2017-07-16 NOTE — Progress Notes (Signed)
   07/15/17 1821  PT Time Calculation  PT Start Time (ACUTE ONLY) 1643  PT Stop Time (ACUTE ONLY) 1659  PT Time Calculation (min) (ACUTE ONLY) 16 min  PT G-Codes **NOT FOR INPATIENT CLASS**  Functional Assessment Tool Used AM-PAC 6 Clicks Basic Mobility;Clinical judgement  Functional Limitation Mobility: Walking and moving around  Mobility: Walking and Moving Around Current Status (Z6109(G8978) CJ  Mobility: Walking and Moving Around Goal Status (U0454(G8979) CI  PT General Charges  $$ ACUTE PT VISIT 1 Procedure  PT Evaluation  $PT Eval Low Complexity 1 Procedure   Luis Tucker, MPT [phone]

## 2017-07-17 ENCOUNTER — Encounter (HOSPITAL_COMMUNITY): Payer: Self-pay | Admitting: Orthopedic Surgery

## 2017-07-22 NOTE — Discharge Summary (Signed)
Physician Discharge Summary  Patient ID: Luis Tucker MRN: [id number] DOB/AGE: 64/03/1951 64 y.o.  Admit date: 07/15/2017 Discharge date: 07/16/2017   Procedures:  Procedure(s) (LRB): LEFT TOTAL KNEE ARTHROPLASTY (Left)  Attending Physician:  Dr. Durene RomansMatthew Olin   Admission Diagnoses:   Left knee primary OA / pain  Discharge Diagnoses:  Active Problems:   S/P total knee replacement  Past Medical History:  Diagnosis Date  . Arthritis    "little in my knees" (12/29/2015)  . CAD (coronary artery disease)    a. h/o stent to Cx 2008. b. cath 03/2009 demonstrated patent stent to circumflex, 60% LAD stenosis. c. [place] 06/2015: severe ostial LCx disease s/p angioplasty/Synergy stent placement. Moderate prox LAD disease, unchanged from prior, patent LCx stent, normal EF.  . CKD (chronic kidney disease), stage III   . Dyslipidemia   . GERD (gastroesophageal reflux disease)   . Hepatitis    "noncontagious type"  . History of non-ST elevation myocardial infarction (NSTEMI) 03/2007   4.0 x 15 mm Vision BMS to mCFX  . History of nuclear stress test 2014   exercise myoview; perfusion defect consistent with infarct/scar (basal/mid/apical inferior regions); low risk scan   . Hypertension   . Kidney stones   . Metabolic syndrome   . Pre-diabetes   . 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 male, has a history of pain and functional disability in the left knee due to arthritis and has failed non-surgical conservative treatments for greater than 12 weeks to includeNSAID's and/or analgesics, corticosteriod injections, viscosupplementation injections, use of assistive devices and activity modification.  Onset of symptoms was gradual, starting 40+ years ago with gradually worsening course since that time. The patient noted prior procedures on the knee to include  arthroscopy and menisectomy x2 on the left knee(s).  Patient currently rates pain in the left knee(s) at  10 out of 10 with activity. Patient has worsening of pain with activity and weight bearing, pain that interferes with activities of daily living, pain with passive range of motion, crepitus and joint swelling.  Patient has evidence of periarticular osteophytes and joint space narrowing by imaging studies.  There is no active infection.   Risks, benefits and expectations were discussed with the patient.  Risks including but not limited to the risk of anesthesia, blood clots, nerve damage, blood vessel damage, failure of the prosthesis, infection and up to and including death.  Patient understand the risks, benefits and expectations and wishes to proceed with surgery.   PCP: Arlan OrganManning, James S, MD   Discharged Condition: good  Hospital Course:  Patient underwent the above stated procedure on 07/15/2017. Patient tolerated the procedure well and brought to the recovery room in good condition and subsequently to the floor.  POD #1 BP: 121/75 ; Pulse: 69 ; Temp: 98.2 F (36.8 C) ; Resp: 17 Patient reports pain as mild, pain controlled. No events throughout the night.  Ready to work with PT.  Ready to be discharged home if he does well with PT and pain is controlled.  Dorsiflexion/plantar flexion intact, incision: dressing C/D/I, no cellulitis present and compartment soft.   LABS  Basename    HGB     12.4  HCT     37.6    Discharge Exam: General appearance: alert, cooperative and no distress Extremities: Homans sign is negative, no sign of DVT, no edema, redness or tenderness  in the calves or thighs and no ulcers, gangrene or trophic changes  Disposition: Home with follow up in 2 weeks   Follow-up Information    Durene Romanslin, Shakeema Lippman, MD. Schedule an appointment as soon as possible for a visit in 2 week(s).   Specialty:  Orthopedic Surgery Contact information: [address] [place] [address] [phone]        [facility] - Dme Follow up.   Why:  bedside  commode Contact information: [address] [place] [address] [phone]           Discharge Instructions    Call MD / Call 911    Complete by:  As directed    If you experience chest pain or shortness of breath, CALL 911 and be transported to the hospital emergency room.  If you develope a fever above 101 F, pus (white drainage) or increased drainage or redness at the wound, or calf pain, call your surgeon's office.   Change dressing    Complete by:  As directed    Maintain surgical dressing until follow up in the clinic. If the edges start to pull up, may reinforce with tape. If the dressing is no longer working, may remove and cover with gauze and tape, but must keep the area dry and clean.  Call with any questions or concerns.   Constipation Prevention    Complete by:  As directed    Drink plenty of fluids.  Prune juice may be helpful.  You may use a stool softener, such as Colace (over the counter) 100 mg twice a day.  Use MiraLax (over the counter) for constipation as needed.   Diet - low sodium heart healthy    Complete by:  As directed    Discharge instructions    Complete by:  As directed    Maintain surgical dressing until follow up in the clinic. If the edges start to pull up, may reinforce with tape. If the dressing is no longer working, may remove and cover with gauze and tape, but must keep the area dry and clean.  [facility]. Call with any questions or concerns.   Increase activity slowly as tolerated    Complete by:  As directed    Weight bearing as tolerated with assist device (walker, cane, etc) as directed, use it as long as suggested by your surgeon or therapist, typically at least 4-6 weeks.   TED hose    Complete by:  As directed    Use stockings (TED hose) for 2 weeks on both leg(s).  You may remove them at night for sleeping.      Allergies as of 07/16/2017   No Known Allergies     Medication List    STOP  taking these medications   acetaminophen 500 MG tablet Commonly known as:  TYLENOL   acetaminophen 650 MG CR tablet Commonly known as:  TYLENOL   aspirin EC 325 MG tablet Replaced by:  aspirin 81 MG chewable tablet     TAKE these medications   aspirin 81 MG chewable tablet Chew 1 tablet (81 mg total) by mouth 2 (two) times daily. Take for 4 weeks. Replaces:  aspirin EC 325 MG tablet   atorvastatin 80 MG tablet Commonly known as:  LIPITOR TAKE 1 TABLET (80 MG TOTAL) BY MOUTH DAILY.   carvedilol 12.5 MG tablet Commonly known as:  COREG TAKE 1 TABLET BY MOUTH TWICE A DAY  WITH MEALS   docusate sodium 100 MG capsule Commonly known as:  COLACE Take 1 capsule (100 mg total) by mouth 2 (two) times daily.   ferrous sulfate 325 (65 FE) MG tablet Commonly known as:  FERROUSUL Take 1 tablet (325 mg total) by mouth 3 (three) times daily with meals.   furosemide 20 MG tablet Commonly known as:  LASIX TAKE 1 TABLET BY MOUTH DAILY. MAY TAKE EXTRA TABLET DAILY AS NEEDED FOR SWELLING. What changed:  See the new instructions.   HYDROcodone-acetaminophen 7.5-325 MG tablet Commonly known as:  NORCO Take 1-2 tablets by mouth every 4 (four) hours as needed for moderate pain or severe pain.   losartan 25 MG tablet Commonly known as:  COZAAR TAKE 1 TABLET BY MOUTH EVERY DAY   methocarbamol 500 MG tablet Commonly known as:  ROBAXIN Take 1 tablet (500 mg total) by mouth every 6 (six) hours as needed for muscle spasms.   nitroGLYCERIN 0.4 MG SL tablet Commonly known as:  NITROSTAT Place 1 tablet under the tongue every 5 (five) minutes as needed for chest pain. Reported on 02/06/2016   pantoprazole 40 MG tablet Commonly known as:  PROTONIX TAKE 1 TABLET (40 MG TOTAL) BY MOUTH DAILY AS NEEDED (ACID REFLUX).   polyethylene glycol packet Commonly known as:  MIRALAX / GLYCOLAX Take 17 g by mouth daily.   triamterene-hydrochlorothiazide 37.5-25 MG capsule Commonly known as:  DYAZIDE TAKE  1 EACH (1 CAPSULE TOTAL) BY MOUTH DAILY.   vardenafil 10 MG tablet Commonly known as:  LEVITRA Take 1 tablet (10 mg total) by mouth daily as needed for erectile dysfunction.        Signed: Anastasio Auerbach. Yue Flanigan   PA-C  07/22/2017, 4:51 PM

## 2017-09-08 ENCOUNTER — Other Ambulatory Visit: Payer: Self-pay | Admitting: Nurse Practitioner

## 2017-11-12 ENCOUNTER — Ambulatory Visit: Payer: 59 | Admitting: Internal Medicine

## 2017-12-08 ENCOUNTER — Other Ambulatory Visit: Payer: Self-pay | Admitting: Internal Medicine

## 2017-12-09 NOTE — Telephone Encounter (Signed)
REFILL 

## 2017-12-28 ENCOUNTER — Other Ambulatory Visit: Payer: Self-pay | Admitting: Internal Medicine

## 2018-01-28 ENCOUNTER — Other Ambulatory Visit: Payer: Self-pay | Admitting: Internal Medicine

## 2018-03-02 ENCOUNTER — Other Ambulatory Visit: Payer: Self-pay | Admitting: Internal Medicine

## 2018-03-03 NOTE — Telephone Encounter (Signed)
REFILL 

## 2018-03-09 ENCOUNTER — Other Ambulatory Visit: Payer: Self-pay | Admitting: Internal Medicine

## 2018-04-04 ENCOUNTER — Other Ambulatory Visit: Payer: Self-pay | Admitting: Nurse Practitioner

## 2018-04-24 ENCOUNTER — Other Ambulatory Visit: Payer: Self-pay | Admitting: Internal Medicine

## 2018-04-24 NOTE — Telephone Encounter (Signed)
REFILL 

## 2018-04-29 ENCOUNTER — Other Ambulatory Visit: Payer: Self-pay | Admitting: Internal Medicine

## 2018-04-29 NOTE — Telephone Encounter (Signed)
REFILL 

## 2018-05-20 ENCOUNTER — Other Ambulatory Visit: Payer: Self-pay | Admitting: Internal Medicine

## 2018-05-21 ENCOUNTER — Other Ambulatory Visit: Payer: Self-pay | Admitting: Internal Medicine

## 2018-06-03 ENCOUNTER — Other Ambulatory Visit: Payer: Self-pay | Admitting: Internal Medicine

## 2018-06-21 ENCOUNTER — Other Ambulatory Visit: Payer: Self-pay | Admitting: Internal Medicine

## 2018-07-14 ENCOUNTER — Telehealth: Payer: Self-pay | Admitting: Internal Medicine

## 2018-07-14 ENCOUNTER — Other Ambulatory Visit: Payer: Self-pay | Admitting: Internal Medicine

## 2018-07-14 MED ORDER — LOSARTAN POTASSIUM 25 MG PO TABS: 25.0000 mg | ORAL_TABLET | Freq: Every day | ORAL | 0 refills | Status: DC

## 2018-07-14 NOTE — Telephone Encounter (Signed)
New Message        *STAT* If patient is at the pharmacy, call can be transferred to refill team.   1. Which medications need to be refilled? (please list name of each medication and dose if known) Losartan 25 mg  2. Which pharmacy/location (including street and city if local pharmacy) is medication to be sent to?[facility] [address]  3. Do they need a 30 day or 90 day supply? 90   Patient is currently out of meds.

## 2018-07-19 ENCOUNTER — Other Ambulatory Visit: Payer: Self-pay | Admitting: Internal Medicine

## 2018-08-25 ENCOUNTER — Other Ambulatory Visit: Payer: Self-pay | Admitting: Internal Medicine

## 2018-08-27 NOTE — Telephone Encounter (Signed)
Rx request sent to pharmacy.  

## 2018-08-31 ENCOUNTER — Other Ambulatory Visit: Payer: Self-pay | Admitting: Internal Medicine

## 2018-09-08 ENCOUNTER — Telehealth: Payer: Self-pay | Admitting: Internal Medicine

## 2018-09-08 NOTE — Telephone Encounter (Signed)
New Message:      *STAT* If patient is at the pharmacy, call can be transferred to refill team.   1. Which medications need to be refilled? (please list name of each medication and dose if known) triamterene-hydrochlorothiazide (DYAZIDE) 37.5-25 MG capsule  2. Which pharmacy/location (including street and city if local pharmacy) is medication to be sent to? [id number]- [place], [place] - [address]  3. Do they need a 30 day or 90 day supply? 90 Days

## 2018-09-09 MED ORDER — TRIAMTERENE-HCTZ 37.5-25 MG PO CAPS: 1.0000 | ORAL_CAPSULE | Freq: Every day | ORAL | 1 refills | Status: DC

## 2018-10-01 ENCOUNTER — Other Ambulatory Visit: Payer: Self-pay | Admitting: Internal Medicine

## 2018-10-07 ENCOUNTER — Other Ambulatory Visit: Payer: Self-pay | Admitting: Internal Medicine

## 2018-10-14 ENCOUNTER — Other Ambulatory Visit: Payer: Self-pay | Admitting: Internal Medicine

## 2018-10-14 NOTE — Telephone Encounter (Signed)
Rx request sent to pharmacy.  

## 2018-10-15 ENCOUNTER — Other Ambulatory Visit: Payer: Self-pay | Admitting: Internal Medicine

## 2018-10-30 ENCOUNTER — Other Ambulatory Visit: Payer: Self-pay | Admitting: Internal Medicine

## 2018-10-31 NOTE — Telephone Encounter (Signed)
Rx request sent to pharmacy.  

## 2018-11-14 ENCOUNTER — Telehealth: Payer: Self-pay | Admitting: Internal Medicine

## 2018-11-14 ENCOUNTER — Ambulatory Visit: Payer: 59 | Admitting: Internal Medicine

## 2018-11-14 ENCOUNTER — Encounter: Payer: Self-pay | Admitting: Internal Medicine

## 2018-11-14 VITALS — BP 117/75 | HR 64 | Ht 71.0 in | Wt 246.0 lb

## 2018-11-14 DIAGNOSIS — Z951 Presence of aortocoronary bypass graft: Secondary | ICD-10-CM

## 2018-11-14 DIAGNOSIS — I2510 Atherosclerotic heart disease of native coronary artery without angina pectoris: Secondary | ICD-10-CM | POA: Diagnosis not present

## 2018-11-14 DIAGNOSIS — N528 Other male erectile dysfunction: Secondary | ICD-10-CM

## 2018-11-14 DIAGNOSIS — I2583 Coronary atherosclerosis due to lipid rich plaque: Secondary | ICD-10-CM

## 2018-11-14 DIAGNOSIS — E785 Hyperlipidemia, unspecified: Secondary | ICD-10-CM | POA: Diagnosis not present

## 2018-11-14 MED ORDER — NITROGLYCERIN 0.4 MG SL SUBL: 0.4000 mg | SUBLINGUAL_TABLET | SUBLINGUAL | 3 refills | Status: DC | PRN

## 2018-11-14 MED ORDER — VARDENAFIL HCL 10 MG PO TABS: 10.0000 mg | ORAL_TABLET | Freq: Every day | ORAL | 0 refills | Status: DC | PRN

## 2018-11-14 NOTE — Patient Instructions (Signed)
Medication Instructions:  Continue current medications DO NOT USE levitra with nitroglycerin If you need a refill on your cardiac medications before your next appointment, please call your pharmacy.   Lab work: FASTING lab work to check cholesterol  Testing/Procedures: NONE  Follow-Up: At [facility], you and your health needs are our priority.  As part of our continuing mission to provide you with exceptional heart care, we have created designated Provider Care Teams.  These Care Teams include your primary Cardiologist (physician) and Advanced Practice Providers (APPs -  Physician Assistants and Nurse Practitioners) who all work together to provide you with the care you need, when you need it. You will need a follow up appointment in 12 months.  Please call our office 2 months in advance to schedule this appointment.  You may see Dr. Rennis GoldenHilty or one of the following Advanced Practice Providers on your designated Care Team: Azalee CourseHao Meng, [place]-C . Micah FlesherAngela Duke, PA-C  Any Other Special Instructions Will Be Listed Below (If Applicable).

## 2018-11-14 NOTE — Progress Notes (Signed)
OFFICE NOTE  Chief Complaint:  Routine follow-up  Primary Care Physician: Arlan Organ, MD  HPI:  Luis Tucker is a pleasant 67 year old male with a history of coronary artery disease. He had a stent to the circumflex in 2008. 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 He also has hypertension dyslipidemia and metabolic syndrome. Is not very physically active as been unable to lose weight recently. 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 He denies any chest pain worsening shortness of breath over the past year and recently had laboratory work provided by Dr. Kathrynn Running at [facility] care which showed a well-controlled lipid profile.  He presented last week to [facility] with worsening chest pain concerning for angina.   His EKG showed SR, J point elevation and inferolateral T waves different from previous ECGs. He was admitted for further evaluation and placed on IV heparin. His home triamterene/HCTZ and losartan were held on admission due to Cr 1.29. Upon further review it appears prior Cr in 2011 was 1.46 so he likely has CKD stage III. Troponins remained negative. LDL 69. Had hypokalemia which required repletion (likely r/t prior HCTZ use). He underwent cardiac cath showing severe ostial LCx disease s/p angioplasty/Synergy stent placement with moderate prox LAD stenosis. LAD disease did not appear to be different than recent path. Patent mid LCx stent. Normal EF and LVEDP. DAPT for at least 1 year recommended - started on Effient.    [name] returns for follow-up. He was recently seen by Norma Fredrickson, NP, who felt that he was doing well. No medication changes were made. He subsequently has seen his primary care provider and was advised that he needed at DOT physical. He denies any further angina or shortness of breath.  [name] is seen today in hospital follow-up. He reports feeling fairly well although  noted his heart rate is been elevated. We went through his extensive medication list and it seems like he is taking several medicines that he probably should not be on. He was taking aspirin 325 and 81 mg as well as Effient. He's also taking HCTZ and Dyazide. In addition he still on indoor which is likely not necessary post bypass. He reports some soreness to his chest wall and numbness but he denies any cardiac chest pain. He does do some walking and gets some mild shortness of breath. EKG shows sinus tachycardia 105.  [name] returns today for follow-up. He was last seen 3 months ago. We had to correct some medication irregularities. He since has been taking his medications as we prescribed. He denies any chest pain or worsening shortness of breath. He is unfortunately gained some weight due to inactivity. He is very interested in going back to work. His surgeon Dr. Cornelius Moras feels that it's okay for him to go back from a surgical standpoint as of tomorrow, 04/03/2016. His DOT evaluator is recommending either stress test or echocardiography and as his perioperative TEE indicated an EF of 50%, I would like to repeat echocardiogram to see if LV function has improved. From a clinical standpoint he's doing very well.  09/28/2016  [name] is seen today in follow-up. Overall he is doing well however he has had weight gain since his last office visit. He was seen in the interim for evaluation of some hand swelling and his Dyazide was stopped and he was put on Lasix. He says  that Lasix is intolerable doesn't seem to help his swelling much. He wants to go back to Dyazide. He's also reported some problems with erectile dysfunction. He is interested in Levitra. He's taken it previously with some benefit. He denies any chest pain or worsening shortness of breath.  05/10/2017  Mr. Clelia Luis Tucker is seen today in follow-up. He is contemplating left knee surgery. He denies any new chest pain or worsening shortness of breath however  has had increasing weight gain. 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 He requires DOT stress testing every other year. He underwent recent LIMA to LAD and free radial graft to the OM 2 and January 2017. He seems to be doing well with this. An EKG in the office today however shows new inferior and lateral T-wave inversions. Etiology of this is unclear.   11/14/2018  Mr. Clelia Luis Tucker is seen today in routine follow-up.  He did undergo left knee surgery last year.  He said his ED where she would have done it sooner.  Unfortunately he continued to gain some weight.  Recently he is lost 20 pounds but overall he is up quite a bit.  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  He denies any chest pain.  He has not had a recent repeat lipid profile.  He is still having issues with ED and is requesting Levitra today.  PMHx:  Past Medical History:  Diagnosis Date  . Arthritis    "little in my knees" (12/29/2015)  . CAD (coronary artery disease)    a. h/o stent to Cx 2008. b. cath 03/2009 demonstrated patent stent to circumflex, 60% LAD stenosis. c[place] 06[place]: severe ostial LCx disease s/p angioplasty/Synergy stent placement. Moderate prox LAD disease, unchanged from prior, patent LCx stent, normal EF.  . CKD (chronic kidney disease), stage III (HCC)   . Dyslipidemia   . GERD (gastroesophageal reflux disease)   . Hepatitis    "noncontagious type"  . History of non-ST elevation myocardial infarction (NSTEMI) 03/2007   4.0 x 15 mm Vision BMS to mCFX  . History of nuclear stress test 2014   exercise myoview; perfusion defect consistent with infarct/scar (basal/mid/apical inferior regions); low risk scan   . Hypertension   . Kidney stones   . Metabolic syndrome   . Pre-diabetes   . 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 CARDIAC  CATHETERIZATION  2010   [place], LAD 60%, CFX 60-70%, stent OK, RCA OK, EF 60%  . CARDIAC CATHETERIZATION N/A 07/20/2015   Procedure: Left Heart Cath and Coronary Angiography;  Surgeon: Iran OuchMuhammad A Arida, MD;  Location: [facility] INVASIVE CV LAB;  Service: Cardiovascular;  Laterality: N/A;  . CARDIAC CATHETERIZATION N/A 07/20/2015   Procedure: Coronary Stent Intervention;  Surgeon: Iran OuchMuhammad A Arida, MD;  Location: [facility] INVASIVE CV LAB;  Service: Cardiovascular;  Laterality: N/A;  . CARDIAC CATHETERIZATION N/A 12/29/2015   Procedure: Left Heart Cath and Coronary Angiography;  Surgeon: Runell GessJonathan J Berry, MD;  [facility];  Service: Cardiovascular;  Laterality: N/A;  . CORONARY ANGIOPLASTY WITH STENT PLACEMENT  03/2007   4.0 x 15-mm Vision BMS mCFX; 60% residual LAD  . CORONARY ARTERY BYPASS GRAFT N/A 01/04/2016   Procedure: CORONARY ARTERY BYPASS GRAFTING (CABG) x two using left internal  mammary artery and left radial artery ;  Surgeon: Purcell Nails, MD;  Location: [facility] OR;  Service: Open Heart Surgery;  Laterality: N/A;  . KNEE ARTHROSCOPY Left 2004; 2011  . LYMPH NODE DISSECTION Right 1973   "groin"  . RADIAL ARTERY HARVEST Left 01/04/2016   Procedure: RADIAL ARTERY HARVEST;  Surgeon: Purcell Nails, MD;  Location: [facility];  Service: Open Heart Surgery;  Laterality: Left;  . TEE WITHOUT CARDIOVERSION N/A 01/04/2016   Procedure: TRANSESOPHAGEAL ECHOCARDIOGRAM (TEE);  Surgeon: Purcell Nails, MD;  Location: [facility] OR;  Service: Open Heart Surgery;  Laterality: N/A;  . TOTAL KNEE ARTHROPLASTY Left 07/15/2017   Procedure: LEFT TOTAL KNEE ARTHROPLASTY;  Surgeon: Durene Romans, MD;  Location: [facility] ORS;  Service: Orthopedics;  Laterality: Left;  90 mins   . TRANSTHORACIC ECHOCARDIOGRAM  04/2007   EF >55%; mild concentric LVH; borderline RV enlargement; RA & LA mildly dilated; mild MR, mild TR, mild PV regurg    FAMHx:  Family History  Problem Relation Age of Onset  . Cancer - Prostate Father      SOCHx:   reports that he quit smoking about 11 years ago. His smoking use included cigarettes. He has a 35.00 pack-year smoking history. He has never used smokeless tobacco. He reports that he drinks about 1.0 standard drinks of alcohol per week. He reports that he has current or past drug history. Drug: Marijuana.  ALLERGIES:  No Known Allergies  ROS: Pertinent items noted in HPI and remainder of comprehensive ROS otherwise negative.  HOME MEDS: Current Outpatient Medications  Medication Sig Dispense Refill  . atorvastatin (LIPITOR) 80 MG tablet TAKE 1 TABLET (80 MG TOTAL) BY MOUTH DAILY. NEED OV. 30 tablet 5  . carvedilol (COREG) 12.5 MG tablet TAKE 1 TABLET BY MOUTH TWICE A DAY WITH MEALS 60 tablet 8  . furosemide (LASIX) 20 MG tablet TAKE 1 TABLET BY MOUTH DAILY. MAY TAKE EXTRA TABLET DAILY AS NEEDED FOR SWELLING. (Patient taking differently: No sig reported) 60 tablet 5  . losartan (COZAAR) 25 MG tablet TAKE 1 TABLET (25 MG TOTAL) BY MOUTH DAILY. KEEP OV. 30 tablet 2  . nitroGLYCERIN (NITROSTAT) 0.4 MG SL tablet Place 1 tablet under the tongue every 5 (five) minutes as needed for chest pain. Reported on 02/06/2016  3  . pantoprazole (PROTONIX) 40 MG tablet TAKE 1 TABLET (40 MG TOTAL) BY MOUTH DAILY AS NEEDED (ACID REFLUX). KEEP OFFICE VISIT 30 tablet 2  . triamterene-hydrochlorothiazide (DYAZIDE) 37.5-25 MG capsule Take 1 each (1 capsule total) by mouth daily. 90 capsule 1   No current facility-administered medications for this visit.     LABS/IMAGING: No results found for this or any previous visit (from the past 48 hour(s)). No results found.  VITALS: Pulse 64   Ht 5\' 11"  (1.803 m)   Wt 246 lb (111.6 kg)   BMI 34.31 kg/m   EXAM: General appearance: alert, no distress and mildly obese Neck: no carotid bruit and no JVD Lungs: clear to auscultation bilaterally Heart: regular rate and rhythm, S1, S2 normal, no murmur, click, rub or gallop and no  Abdomen: soft,  non-tender; bowel sounds normal; no masses,  no organomegaly Extremities: extremities normal, atraumatic, no cyanosis or edema and . Pulses: 2+ and symmetric Skin: Skin color, texture, turgor normal. No rashes or lesions Neurologic: Grossly normal  EKG: Sinus rhythm 64, nonspecific IVCD, inferior T wave changes-personally reviewed  ASSESSMENT: 1. Abnormal EKG with inferior lateral T-wave changes 2. Coronary artery disease  status post PCI to the circumflex in 2008 - and PCI to the ostial left circumflex with a synergy DES (2016) 3. ISR with CABG x 2 with LIMA to LAD and free radial graft to OM2 (12/2015) 4. Metabolic syndrome 5. Hypertension 6. Hyperlipidemia 7. ED  PLAN: 1.   Mr. Riechers is doing well and is asymptomatic.  Unfortunately he has had weight gain and has recently change his diet and is trying to lose some of that.  He is overdue for repeat cholesterol assessment which we will order today.  He continues to have some ED which will go ahead and re-prescribe Levitra for.  He has done well with this in the past. He is advised not to use nitrates with this.  Plan follow-up with me annually or sooner as necessary.  Chrystie Nose, MD, [facility], FACP  [place]  [facility]  Medical Director of the [facility] Lipid Disorders &  Cardiovascular Risk Reduction Clinic Diplomate of the American Board of Clinical Lipidology Attending Cardiologist  Direct Dial: [phone]  Fax: [phone]  Website:  [email or web address] 11/14/2018, 3:51 PM

## 2018-11-14 NOTE — Telephone Encounter (Signed)
Levitra PA submitted via [email or web address] KeyJudd Gaudier: [id number] - PA Case ID: [id number] - Rx #: [id number]  Approved today  Case ID: [id number] Status: Approved Review Type: Prior Auth Coverage Start Date:10/15/2018 Coverage End Date:11/14/2019

## 2019-01-09 ENCOUNTER — Other Ambulatory Visit: Payer: Self-pay | Admitting: Internal Medicine

## 2019-01-11 ENCOUNTER — Other Ambulatory Visit: Payer: Self-pay | Admitting: Internal Medicine

## 2019-02-20 ENCOUNTER — Other Ambulatory Visit: Payer: Self-pay | Admitting: Internal Medicine

## 2019-05-08 ENCOUNTER — Encounter: Payer: Self-pay | Admitting: Gastroenterology

## 2019-05-08 ENCOUNTER — Other Ambulatory Visit: Payer: Self-pay | Admitting: Internal Medicine

## 2019-05-08 NOTE — Telephone Encounter (Signed)
Dyazide refilled. 

## 2019-05-13 ENCOUNTER — Telehealth: Payer: Self-pay | Admitting: *Deleted

## 2019-05-13 NOTE — Telephone Encounter (Signed)
Called pt today to schedule a recall screening colon- last colon 03/2009 with  Dr Christella Hartigan- pt had 2 polyps removed and per surg path was benign colonic mucosa- pt states he had a colonoscopy 1 1/2 years at the [facility] in [place] and that colonoscopy was normal, so he wishes to not RS here for a repeat colon at this time.   Encouraged pt to call with with any GI issues in the future.

## 2019-06-17 ENCOUNTER — Other Ambulatory Visit: Payer: Self-pay | Admitting: Internal Medicine

## 2019-08-24 ENCOUNTER — Telehealth: Payer: Self-pay | Admitting: Internal Medicine

## 2019-08-24 DIAGNOSIS — I213 ST elevation (STEMI) myocardial infarction of unspecified site: Secondary | ICD-10-CM

## 2019-08-24 NOTE — Telephone Encounter (Signed)
Left detailed message as noted for patient (DPR), call back w/any questions Order entered for scheduling to call to schedule appointment

## 2019-08-24 NOTE — Telephone Encounter (Signed)
Ok to authorize stress testing for DOT purposes.  Dr. Lemmie Evens

## 2019-08-24 NOTE — Telephone Encounter (Signed)
LVM for patient to call back to schedule lexiscan.

## 2019-08-24 NOTE — Telephone Encounter (Signed)
New message:    Patient calling stating that some one called hem I did not see a note. Please call patient.

## 2019-08-24 NOTE — Telephone Encounter (Signed)
Pt called back, please try to call again

## 2019-08-24 NOTE — Telephone Encounter (Signed)
° ° °  Patient calling to request  order for stress test for DOT physical

## 2019-08-25 ENCOUNTER — Telehealth: Payer: Self-pay | Admitting: Internal Medicine

## 2019-08-25 DIAGNOSIS — Z951 Presence of aortocoronary bypass graft: Secondary | ICD-10-CM

## 2019-08-25 DIAGNOSIS — Z0289 Encounter for other administrative examinations: Secondary | ICD-10-CM

## 2019-08-25 DIAGNOSIS — I213 ST elevation (STEMI) myocardial infarction of unspecified site: Secondary | ICD-10-CM

## 2019-08-25 NOTE — Telephone Encounter (Signed)
He needs a myoview because his EKG is abnormal at rest and we cannot use it alone to rule out ischemia. He could exercise if he wants, but we need perfusion imaging.  Dr. Lemmie Evens

## 2019-08-25 NOTE — Telephone Encounter (Signed)
Spoke with patient who prefers to have an exercise myocardial perfusion study. He is aware he has to have a COVID19 screening test prior. Test has been ordered. Message sent to scheduler

## 2019-08-25 NOTE — Telephone Encounter (Signed)
Okay to change order?  Patient needs it for DOT, per previous message. Thanks!

## 2019-08-25 NOTE — Telephone Encounter (Signed)
Patient has order for Myocardial perfusion ordered, he does not want that we would rather have a ETT.  He would like to have the order changed.

## 2019-08-27 NOTE — Telephone Encounter (Signed)
RE: exercise myoview appt needed Received: Yesterday Message Contents  [place], Avie Arenas, RN        Reed Breech called this patient yesterday and he said he didn't want to do this test. Please clarify.

## 2019-08-27 NOTE — Telephone Encounter (Signed)
Spoke with patient about stress test. He wants to have exercise nuclear stress test, which is what has been ordered and what he and I discussed previously. Apologized if there was miscommunication between he and I or between myself and the scheduler. He is willing to proceed with exercise myoview and is aware a [phone] test is required prior  Message sent to [place] to arrange

## 2019-09-01 NOTE — Telephone Encounter (Signed)
Exercise myocardial will need approval from [place]: 5 days ago Message Contents  Corinda Gubler, Lexine Baton, RN; [place]es, Ramiro Harvest

## 2019-09-02 ENCOUNTER — Telehealth: Payer: Self-pay | Admitting: Internal Medicine

## 2019-09-02 DIAGNOSIS — Z01812 Encounter for preprocedural laboratory examination: Secondary | ICD-10-CM

## 2019-09-02 NOTE — Telephone Encounter (Signed)
-----   Message from Marcine Matar sent at 09/02/2019  2:05 PM EDT ----- Regarding: COVID test Good Day,  There is an Approve exercise myocardial perfusion scheduled on Friday, September 11, 2019, @ 9:30 am patient of Dr. Debara Pickett name Luis Tucker.  Please call the patient and schedule/order COVID testing  [facility] the patient is aware the nurse will be calling to set up COVID testing and is aware to self-isolate after the COVID testing is performed until the stress echo appointment.  If the patient does not have the COVID testing performed and if we don't have the testing results back prior to the stress echo appointment, we will have to cancel the test.  [name]

## 2019-09-02 NOTE — Telephone Encounter (Signed)
Patient scheduled for COVID19 screening on 09/08/2019 for 09/11/2019 exercise myoview. He is aware to go to [facility] for 11:15am appointment and take ID/insurance. He should proceed to far right lane. He will quarantine from time of COVID screening until stress test.

## 2019-09-08 ENCOUNTER — Other Ambulatory Visit (HOSPITAL_COMMUNITY)
Admission: RE | Admit: 2019-09-08 | Discharge: 2019-09-08 | Disposition: A | Discharge status: Home / Self Care | Payer: Medicare HMO | Source: Ambulatory Visit | Attending: Internal Medicine | Admitting: Internal Medicine

## 2019-09-08 DIAGNOSIS — Z20828 Contact with and (suspected) exposure to other viral communicable diseases: Secondary | ICD-10-CM | POA: Diagnosis present

## 2019-09-08 DIAGNOSIS — Z01812 Encounter for preprocedural laboratory examination: Secondary | ICD-10-CM | POA: Insufficient documentation

## 2019-09-09 ENCOUNTER — Telehealth (HOSPITAL_COMMUNITY): Payer: Self-pay

## 2019-09-09 LAB — NOVEL CORONAVIRUS, NAA (HOSP ORDER, SEND-OUT TO REF LAB; TAT 18-24 HRS): SARS-CoV-2, NAA: NOT DETECTED

## 2019-09-09 NOTE — Telephone Encounter (Signed)
Encounter complete. 

## 2019-09-11 ENCOUNTER — Other Ambulatory Visit: Payer: Self-pay

## 2019-09-11 ENCOUNTER — Ambulatory Visit (HOSPITAL_COMMUNITY)
Admission: RE | Admit: 2019-09-11 | Discharge: 2019-09-11 | Disposition: A | Discharge status: Home / Self Care | Payer: Medicare HMO | Source: Ambulatory Visit | Attending: Cardiology | Admitting: Cardiology

## 2019-09-11 DIAGNOSIS — Z951 Presence of aortocoronary bypass graft: Secondary | ICD-10-CM | POA: Diagnosis not present

## 2019-09-11 DIAGNOSIS — Z0289 Encounter for other administrative examinations: Secondary | ICD-10-CM | POA: Diagnosis present

## 2019-09-11 LAB — MYOCARDIAL PERFUSION IMAGING
Estimated workload: 6.6 METS
Exercise duration (min): 6 min
Exercise duration (sec): 1 s
LV dias vol: 165 mL (ref 62–150)
LV sys vol: 87 mL
MPHR: 154 {beats}/min
Peak HR: 141 {beats}/min
Percent HR: 91 %
Rest HR: 53 {beats}/min
SDS: 1
SRS: 8
SSS: 9
TID: 1.09

## 2019-09-11 MED ORDER — REGADENOSON 0.4 MG/5ML IV SOLN: 0.4000 mg | Freq: Once | INTRAVENOUS | Status: DC

## 2019-09-11 MED ORDER — TECHNETIUM TC 99M TETROFOSMIN IV KIT
29.2000 | PACK | Freq: Once | INTRAVENOUS | Status: AC | PRN
  Administered 2019-09-11: 29.2 via INTRAVENOUS
  Filled 2019-09-11: qty 30

## 2019-09-11 MED ORDER — TECHNETIUM TC 99M TETROFOSMIN IV KIT
9.6000 | PACK | Freq: Once | INTRAVENOUS | Status: AC | PRN
  Administered 2019-09-11: 9.6 via INTRAVENOUS
  Filled 2019-09-11: qty 10

## 2019-11-11 ENCOUNTER — Other Ambulatory Visit: Payer: Self-pay | Admitting: Internal Medicine

## 2019-11-12 ENCOUNTER — Other Ambulatory Visit: Payer: Self-pay

## 2019-11-12 ENCOUNTER — Ambulatory Visit (INDEPENDENT_AMBULATORY_CARE_PROVIDER_SITE_OTHER): Payer: Medicare HMO | Admitting: Internal Medicine

## 2019-11-12 ENCOUNTER — Encounter: Payer: Self-pay | Admitting: Internal Medicine

## 2019-11-12 VITALS — BP 141/97 | HR 66 | Temp 97.0°F | Ht 71.0 in | Wt 247.0 lb

## 2019-11-12 DIAGNOSIS — E785 Hyperlipidemia, unspecified: Secondary | ICD-10-CM

## 2019-11-12 DIAGNOSIS — N528 Other male erectile dysfunction: Secondary | ICD-10-CM | POA: Diagnosis not present

## 2019-11-12 DIAGNOSIS — Z951 Presence of aortocoronary bypass graft: Secondary | ICD-10-CM

## 2019-11-12 DIAGNOSIS — I2510 Atherosclerotic heart disease of native coronary artery without angina pectoris: Secondary | ICD-10-CM

## 2019-11-12 DIAGNOSIS — I2583 Coronary atherosclerosis due to lipid rich plaque: Secondary | ICD-10-CM

## 2019-11-12 MED ORDER — VARDENAFIL HCL 20 MG PO TABS: 20.0000 mg | ORAL_TABLET | Freq: Every day | ORAL | 1 refills | Status: DC | PRN

## 2019-11-12 NOTE — Patient Instructions (Signed)
Medication Instructions:  Dr. Debara Pickett has increase levitra to 20mg  - you can take NO MORE than 1 tablet in 24 hours *If you need a refill on your cardiac medications before your next appointment, please call your pharmacy*  Lab Work: LIPID PANEL before next visit If you have labs (blood work) drawn today and your tests are completely normal, you will receive your results only by: Marland Kitchen MyChart Message (if you have MyChart) OR . A paper copy in the mail If you have any lab test that is abnormal or we need to change your treatment, we will call you to review the results.  Testing/Procedures: NONE  Follow-Up: At [facility], you and your health needs are our priority.  As part of our continuing mission to provide you with exceptional heart care, we have created designated Provider Care Teams.  These Care Teams include your primary Cardiologist (physician) and Advanced Practice Providers (APPs -  Physician Assistants and Nurse Practitioners) who all work together to provide you with the care you need, when you need it.  Your next appointment:   12 month(s)  The format for your next appointment:   In Person  Provider:   You may see Dr. Debara Pickett or one of the following Advanced Practice Providers on your designated Care Team:    Almyra Deforest, PA-C  Fabian Sharp, [place] or   Roby Lofts, [place]   Other Instructions

## 2019-11-12 NOTE — Progress Notes (Signed)
OFFICE NOTE  Chief Complaint:  Routine follow-up  Primary Care Physician: Arlan Organ, MD  HPI:  TAHMID Tucker is a pleasant 68 year old male with a history of coronary artery disease. He had a stent to the circumflex in 2008. 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 He also has hypertension dyslipidemia and metabolic syndrome. Is not very physically active as been unable to lose weight recently. 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 He denies any chest pain worsening shortness of breath over the past year and recently had laboratory work provided by Dr. Kathrynn Running at [facility] care which showed a well-controlled lipid profile.  He presented last week to [facility] with worsening chest pain concerning for angina.   His EKG showed SR, J point elevation and inferolateral T waves different from previous ECGs. He was admitted for further evaluation and placed on IV heparin. His home triamterene/HCTZ and losartan were held on admission due to Cr 1.29. Upon further review it appears prior Cr in 2011 was 1.46 so he likely has CKD stage III. Troponins remained negative. LDL 69. Had hypokalemia which required repletion (likely r/t prior HCTZ use). He underwent cardiac cath showing severe ostial LCx disease s/p angioplasty/Synergy stent placement with moderate prox LAD stenosis. LAD disease did not appear to be different than recent path. Patent mid LCx stent. Normal EF and LVEDP. DAPT for at least 1 year recommended - started on Effient.    [name] returns for follow-up. He was recently seen by Norma Fredrickson, NP, who felt that he was doing well. No medication changes were made. He subsequently has seen his primary care provider and was advised that he needed at DOT physical. He denies any further angina or shortness of breath.  [name] is seen today in hospital follow-up. He reports feeling fairly well although  noted his heart rate is been elevated. We went through his extensive medication list and it seems like he is taking several medicines that he probably should not be on. He was taking aspirin 325 and 81 mg as well as Effient. He's also taking HCTZ and Dyazide. In addition he still on indoor which is likely not necessary post bypass. He reports some soreness to his chest wall and numbness but he denies any cardiac chest pain. He does do some walking and gets some mild shortness of breath. EKG shows sinus tachycardia 105.  [name] returns today for follow-up. He was last seen 3 months ago. We had to correct some medication irregularities. He since has been taking his medications as we prescribed. He denies any chest pain or worsening shortness of breath. He is unfortunately gained some weight due to inactivity. He is very interested in going back to work. His surgeon Dr. Cornelius Moras feels that it's okay for him to go back from a surgical standpoint as of tomorrow, 04/03/2016. His DOT evaluator is recommending either stress test or echocardiography and as his perioperative TEE indicated an EF of 50%, I would like to repeat echocardiogram to see if LV function has improved. From a clinical standpoint he's doing very well.  09/28/2016  [name] is seen today in follow-up. Overall he is doing well however he has had weight gain since his last office visit. He was seen in the interim for evaluation of some hand swelling and his Dyazide was stopped and he was put on Lasix. He says  that Lasix is intolerable doesn't seem to help his swelling much. He wants to go back to Dyazide. He's also reported some problems with erectile dysfunction. He is interested in Levitra. He's taken it previously with some benefit. He denies any chest pain or worsening shortness of breath.  05/10/2017  [name] is seen today in follow-up. He is contemplating left knee surgery. He denies any new chest pain or worsening shortness of breath however  has had increasing weight gain. 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 He requires DOT stress testing every other year. He underwent recent LIMA to LAD and free radial graft to the OM 2 and January 2017. He seems to be doing well with this. An EKG in the office today however shows new inferior and lateral T-wave inversions. Etiology of this is unclear.   11/14/2018  [name] is seen today in routine follow-up.  He did undergo left knee surgery last year.  He said his ED where she would have done it sooner.  Unfortunately he continued to gain some weight.  Recently he is lost 20 pounds but overall he is up quite a bit.  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  He denies any chest pain.  He has not had a recent repeat lipid profile.  He is still having issues with ED and is requesting Levitra today.  11/12/2019  [name] is seen for annual follow-up.  He recently underwent stress testing which was negative for ischemia however showed an LVEF of 48%.  This could have been artifactual or might be related to some degree of uncontrolled hypertension.  Blood pressure initially today was 141/97 however recheck came down to 124/80.  Weight has stayed elevated although he exercises regularly.  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  He wants to maintain his DOT just in case he would consider working again.  He has no chest pain or worsening shortness of breath.  He continues to have some issues with erectile dysfunction and was on Levitra 10mg  as needed which she says has not been effective.  PMHx:  Past Medical History:  Diagnosis Date  . Arthritis    "little in my knees" (12/29/2015)  . CAD (coronary artery disease)    a. h/o stent to Cx 2008. b. cath 03/2009 demonstrated patent stent to circumflex, 60% LAD stenosis. c. [place] 06[place]:  severe ostial LCx disease s/p angioplasty/Synergy stent placement. Moderate prox LAD disease, unchanged from prior, patent LCx stent, normal EF.  . CKD (chronic kidney disease), stage III   . Dyslipidemia   . GERD (gastroesophageal reflux disease)   . Hepatitis    "noncontagious type"  . History of non-ST elevation myocardial infarction (NSTEMI) 03/2007   4.0 x 15 mm Vision BMS to mCFX  . History of nuclear stress test 2014   exercise myoview; perfusion defect consistent with infarct/scar (basal/mid/apical inferior regions); low risk scan   . Hypertension   . Kidney stones   . Metabolic syndrome   . Pre-diabetes   . 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 CARDIAC CATHETERIZATION  2010   [place], LAD 60%, CFX 60-70%, stent OK, RCA OK, EF 60%  .  CARDIAC CATHETERIZATION N/A 07/20/2015   Procedure: Left Heart Cath and Coronary Angiography;  Surgeon: Iran Ouch, MD;  Location: [facility] INVASIVE CV LAB;  Service: Cardiovascular;  Laterality: N/A;  . CARDIAC CATHETERIZATION N/A 07/20/2015   Procedure: Coronary Stent Intervention;  Surgeon: Iran Ouch, MD;  Location: [facility] INVASIVE CV LAB;  Service: Cardiovascular;  Laterality: N/A;  . CARDIAC CATHETERIZATION N/A 12/29/2015   Procedure: Left Heart Cath and Coronary Angiography;  Surgeon: Runell Gess, MD;  Location: [facility] INVASIVE CV LAB;  Service: Cardiovascular;  Laterality: N/A;  . CORONARY ANGIOPLASTY WITH STENT PLACEMENT  03/2007   4.0 x 15-mm Vision BMS mCFX; 60% residual LAD  . CORONARY ARTERY BYPASS GRAFT N/A 01/04/2016   Procedure: CORONARY ARTERY BYPASS GRAFTING (CABG) x two using left internal mammary artery and left radial artery ;  Surgeon: Purcell Nails, MD;  Location: [facility] OR;  Service: Open Heart Surgery;  Laterality: N/A;  . KNEE ARTHROSCOPY Left 2004; 2011  . LYMPH NODE DISSECTION Right 1973   "groin"  . RADIAL ARTERY HARVEST Left 01/04/2016   Procedure:  RADIAL ARTERY HARVEST;  Surgeon: Purcell Nails, MD;  Location: [facility] OR;  Service: Open Heart Surgery;  Laterality: Left;  . TEE WITHOUT CARDIOVERSION N/A 01/04/2016   Procedure: TRANSESOPHAGEAL ECHOCARDIOGRAM (TEE);  Surgeon: Purcell Nails, MD;  Location: [facility] OR;  Service: Open Heart Surgery;  Laterality: N/A;  . TOTAL KNEE ARTHROPLASTY Left 07/15/2017   Procedure: LEFT TOTAL KNEE ARTHROPLASTY;  Surgeon: Durene Romans, MD;  Location: [facility] ORS;  Service: Orthopedics;  Laterality: Left;  90 mins   . TRANSTHORACIC ECHOCARDIOGRAM  04/2007   EF >55%; mild concentric LVH; borderline RV enlargement; RA & LA mildly dilated; mild MR, mild TR, mild PV regurg    FAMHx:  Family History  Problem Relation Age of Onset  . Cancer - Prostate Father     SOCHx:   reports that he quit smoking about 12 years ago. His smoking use included cigarettes. He has a 35.00 pack-year smoking history. He has never used smokeless tobacco. He reports current alcohol use of about 1.0 standard drinks of alcohol per week. He reports current drug use. Drug: Marijuana.  ALLERGIES:  No Known Allergies  ROS: Pertinent items noted in HPI and remainder of comprehensive ROS otherwise negative.  HOME MEDS: Current Outpatient Medications  Medication Sig Dispense Refill  . atorvastatin (LIPITOR) 80 MG tablet Take 1 tablet (80 mg total) by mouth daily. KEEP OV. 90 tablet 2  . carvedilol (COREG) 12.5 MG tablet TAKE 1 TABLET BY MOUTH TWICE A DAY WITH MEALS 60 tablet 5  . furosemide (LASIX) 20 MG tablet TAKE 1 TABLET BY MOUTH DAILY. MAY TAKE EXTRA TABLET DAILY AS NEEDED FOR SWELLING. (Patient taking differently: No sig reported) 60 tablet 5  . losartan (COZAAR) 25 MG tablet TAKE 1 TABLET (25 MG TOTAL) BY MOUTH DAILY. KEEP OV. 30 tablet 11  . nitroGLYCERIN (NITROSTAT) 0.4 MG SL tablet Place 1 tablet (0.4 mg total) under the tongue every 5 (five) minutes as needed for chest pain. 25 tablet 3  . pantoprazole (PROTONIX) 40 MG tablet TAKE 1  TABLET (40 MG TOTAL) BY MOUTH DAILY AS NEEDED (ACID REFLUX). KEEP OFFICE VISIT 30 tablet 11  . triamterene-hydrochlorothiazide (DYAZIDE) 37.5-25 MG capsule TAKE 1 EACH (1 CAPSULE TOTAL) BY MOUTH DAILY. 30 capsule 5  . vardenafil (LEVITRA) 10 MG tablet Take 1 tablet (10 mg total) by mouth daily as needed for erectile dysfunction. 10 tablet 0  No current facility-administered medications for this visit.     LABS/IMAGING: No results found for this or any previous visit (from the past 48 hour(s)). No results found.  VITALS: BP (!) 141/97   Pulse 66   Temp (!) 97 F (36.1 C)   Ht 5\' 11"  (1.803 m)   Wt 247 lb (112 kg)   SpO2 99%   BMI 34.45 kg/m   EXAM: General appearance: alert, no distress and mildly obese Neck: no carotid bruit and no JVD Lungs: clear to auscultation bilaterally Heart: regular rate and rhythm, S1, S2 normal, no murmur, click, rub or gallop and no  Abdomen: soft, non-tender; bowel sounds normal; no masses,  no organomegaly Extremities: extremities normal, atraumatic, no cyanosis or edema and . Pulses: 2+ and symmetric Skin: Skin color, texture, turgor normal. No rashes or lesions Neurologic: Grossly normal  EKG: Deferred  ASSESSMENT: 1. Abnormal EKG with inferior lateral T-wave changes 2. Coronary artery disease status post PCI to the circumflex in 2008 - and PCI to the ostial left circumflex with a synergy DES (2016) 3. ISR with CABG x 2 with LIMA to LAD and free radial graft to OM2 (12/2015) 4. Metabolic syndrome 5. Hypertension 6. Hyperlipidemia 7. ED  PLAN: 1.   Mr. Schirtzinger repeat Myoview stress test which was negative for ischemia.  EF is 48% which might be artifactual.  He has no heart failure symptoms.  Blood pressure is well controlled.  He is exercising very regularly now since he retired.  I have no concerns about him undergoing DOT driving if he chooses to do so.  With regards to erectile dysfunction, will try to increase his Levitra to 20 mg as  needed.  If this is not successful, he may need a referral to urology.  He is advised not to use this with nitrates.  Plan follow-up with me annually or sooner as necessary.  Pixie Casino, MD, [facility], [place] Director of the Advanced Lipid Disorders &  Cardiovascular Risk Reduction Clinic Diplomate of the American Board of Clinical Lipidology Attending Cardiologist  Direct Dial: [phone]  Fax: [phone]  Website:  [email or web address] 11/12/2019, 9:08 AM

## 2019-11-26 ENCOUNTER — Other Ambulatory Visit: Payer: Self-pay | Admitting: Internal Medicine

## 2019-12-09 ENCOUNTER — Other Ambulatory Visit: Payer: Self-pay | Admitting: Internal Medicine

## 2019-12-12 ENCOUNTER — Other Ambulatory Visit: Payer: Self-pay | Admitting: Internal Medicine

## 2019-12-14 NOTE — Telephone Encounter (Signed)
Rx(s) sent to pharmacy electronically.  

## 2020-01-08 ENCOUNTER — Other Ambulatory Visit: Payer: Self-pay | Admitting: Internal Medicine

## 2020-05-16 ENCOUNTER — Other Ambulatory Visit (HOSPITAL_COMMUNITY): Payer: Self-pay | Admitting: Orthopedic Surgery

## 2020-05-16 ENCOUNTER — Other Ambulatory Visit: Payer: Self-pay | Admitting: Orthopedic Surgery

## 2020-05-16 DIAGNOSIS — Z96652 Presence of left artificial knee joint: Secondary | ICD-10-CM

## 2020-07-22 ENCOUNTER — Other Ambulatory Visit (HOSPITAL_COMMUNITY): Payer: Self-pay | Admitting: Orthopedic Surgery

## 2020-07-22 DIAGNOSIS — Z96652 Presence of left artificial knee joint: Secondary | ICD-10-CM

## 2020-07-29 ENCOUNTER — Encounter (HOSPITAL_COMMUNITY)
Admission: RE | Admit: 2020-07-29 | Discharge: 2020-07-29 | Disposition: A | Discharge status: Home / Self Care | Payer: Medicare HMO | Source: Ambulatory Visit | Attending: Orthopedic Surgery | Admitting: Orthopedic Surgery

## 2020-07-29 ENCOUNTER — Other Ambulatory Visit: Payer: Self-pay

## 2020-07-29 DIAGNOSIS — Z96652 Presence of left artificial knee joint: Secondary | ICD-10-CM | POA: Insufficient documentation

## 2020-07-29 MED ORDER — TECHNETIUM TC 99M MEDRONATE IV KIT
21.2000 | PACK | Freq: Once | INTRAVENOUS | Status: AC | PRN
  Administered 2020-07-29: 21.2 via INTRAVENOUS

## 2020-08-01 ENCOUNTER — Other Ambulatory Visit: Payer: Self-pay | Admitting: Internal Medicine

## 2020-09-12 ENCOUNTER — Other Ambulatory Visit: Payer: Self-pay | Admitting: Internal Medicine

## 2020-09-12 DIAGNOSIS — E785 Hyperlipidemia, unspecified: Secondary | ICD-10-CM

## 2020-11-16 ENCOUNTER — Other Ambulatory Visit: Payer: Self-pay | Admitting: Internal Medicine

## 2020-12-04 ENCOUNTER — Other Ambulatory Visit: Payer: Self-pay | Admitting: Internal Medicine

## 2020-12-06 ENCOUNTER — Other Ambulatory Visit: Payer: Self-pay | Admitting: Internal Medicine

## 2020-12-15 ENCOUNTER — Other Ambulatory Visit: Payer: Self-pay | Admitting: Internal Medicine

## 2020-12-29 ENCOUNTER — Other Ambulatory Visit: Payer: Self-pay | Admitting: Internal Medicine

## 2020-12-30 ENCOUNTER — Telehealth: Payer: Self-pay | Admitting: Internal Medicine

## 2020-12-30 NOTE — Telephone Encounter (Signed)
°*  STAT* If patient is at the pharmacy, call can be transferred to refill team.   1. Which medications need to be refilled? (please list name of each medication and dose if known) atorvastatin (LIPITOR) 80 MG tablet, carvedilol (COREG) 12.5 MG tablet, losartan (COZAAR) 25 MG tablet, pantoprazole (PROTONIX) 40 MG tablet, triamterene-hydrochlorothiazide (DYAZIDE) 37.5-25 MG capsule, vardenafil (LEVITRA) 20 MG tablet, furosemide (LASIX) 20 MG tablet, nitroGLYCERIN (NITROSTAT) 0.4 MG SL tablet  2. Which pharmacy/location (including street and city if local pharmacy) is medication to be sent to? [id number] [place], [place] - [address]  3. Do they need a 30 day or 90 day supply? 90

## 2021-01-03 MED ORDER — TRIAMTERENE-HCTZ 37.5-25 MG PO CAPS: 1.0000 | ORAL_CAPSULE | Freq: Every day | ORAL | 0 refills | Status: DC

## 2021-01-03 MED ORDER — CARVEDILOL 12.5 MG PO TABS: 12.5000 mg | ORAL_TABLET | Freq: Two times a day (BID) | ORAL | 0 refills | Status: DC

## 2021-01-03 MED ORDER — PANTOPRAZOLE SODIUM 40 MG PO TBEC: 40.0000 mg | DELAYED_RELEASE_TABLET | Freq: Every day | ORAL | 3 refills | Status: DC | PRN

## 2021-01-03 MED ORDER — ATORVASTATIN CALCIUM 80 MG PO TABS: 80.0000 mg | ORAL_TABLET | Freq: Every day | ORAL | 2 refills | Status: DC

## 2021-01-03 MED ORDER — NITROGLYCERIN 0.4 MG SL SUBL: 0.4000 mg | SUBLINGUAL_TABLET | SUBLINGUAL | 3 refills | Status: DC | PRN

## 2021-01-18 ENCOUNTER — Other Ambulatory Visit: Payer: Self-pay | Admitting: Internal Medicine

## 2021-01-24 ENCOUNTER — Other Ambulatory Visit: Payer: Self-pay | Admitting: Internal Medicine

## 2021-02-17 ENCOUNTER — Other Ambulatory Visit: Payer: Self-pay

## 2021-02-17 MED ORDER — CARVEDILOL 12.5 MG PO TABS: 12.5000 mg | ORAL_TABLET | Freq: Two times a day (BID) | ORAL | 0 refills | Status: DC

## 2021-02-18 LAB — LIPID PANEL
Chol/HDL Ratio: 3.8 ratio (ref 0.0–5.0)
Cholesterol, Total: 103 mg/dL (ref 100–199)
HDL: 27 mg/dL — ABNORMAL LOW (ref >39)
LDL Chol Calc (NIH): 55 mg/dL (ref 0–99)
Triglycerides: 112 mg/dL (ref 0–149)
VLDL Cholesterol Cal: 21 mg/dL (ref 5–40)

## 2021-02-21 ENCOUNTER — Other Ambulatory Visit: Payer: Self-pay | Admitting: Internal Medicine

## 2021-02-24 ENCOUNTER — Other Ambulatory Visit: Payer: Self-pay | Admitting: Internal Medicine

## 2021-03-07 ENCOUNTER — Encounter: Payer: Self-pay | Admitting: Internal Medicine

## 2021-03-07 ENCOUNTER — Other Ambulatory Visit: Payer: Self-pay

## 2021-03-07 ENCOUNTER — Ambulatory Visit: Payer: Medicare HMO | Admitting: Internal Medicine

## 2021-03-07 ENCOUNTER — Other Ambulatory Visit: Payer: Self-pay | Admitting: Internal Medicine

## 2021-03-07 VITALS — BP 125/73 | HR 62 | Ht 71.0 in | Wt 252.0 lb

## 2021-03-07 DIAGNOSIS — Z951 Presence of aortocoronary bypass graft: Secondary | ICD-10-CM

## 2021-03-07 DIAGNOSIS — I2510 Atherosclerotic heart disease of native coronary artery without angina pectoris: Secondary | ICD-10-CM

## 2021-03-07 DIAGNOSIS — N528 Other male erectile dysfunction: Secondary | ICD-10-CM

## 2021-03-07 DIAGNOSIS — E785 Hyperlipidemia, unspecified: Secondary | ICD-10-CM | POA: Diagnosis not present

## 2021-03-07 DIAGNOSIS — I2583 Coronary atherosclerosis due to lipid rich plaque: Secondary | ICD-10-CM

## 2021-03-07 MED ORDER — VARDENAFIL HCL 20 MG PO TABS: 20.0000 mg | ORAL_TABLET | Freq: Every day | ORAL | 3 refills | Status: DC | PRN

## 2021-03-07 NOTE — Patient Instructions (Signed)
Medication Instructions:  Your physician recommends that you continue on your current medications as directed. Please refer to the Current Medication list given to you today.  *If you need a refill on your cardiac medications before your next appointment, please call your pharmacy*   Lab Work: NONE If you have labs (blood work) drawn today and your tests are completely normal, you will receive your results only by: Marland Kitchen MyChart Message (if you have MyChart) OR . A paper copy in the mail If you have any lab test that is abnormal or we need to change your treatment, we will call you to review the results.   Testing/Procedures: NONE   Follow-Up: At [facility], you and your health needs are our priority.  As part of our continuing mission to provide you with exceptional heart care, we have created designated Provider Care Teams.  These Care Teams include your primary Cardiologist (physician) and Advanced Practice Providers (APPs -  Physician Assistants and Nurse Practitioners) who all work together to provide you with the care you need, when you need it.  We recommend signing up for the patient portal called "MyChart".  Sign up information is provided on this After Visit Summary.  MyChart is used to connect with patients for Virtual Visits (Telemedicine).  Patients are able to view lab/test results, encounter notes, upcoming appointments, etc.  Non-urgent messages can be sent to your provider as well.   To learn more about what you can do with MyChart, go to [email or web address].    Your next appointment:   6 month(s)  The format for your next appointment:   In Person  Provider:   You may see Dr. Rennis Golden or one of the following Advanced Practice Providers on your designated Care Team:    Azalee Course, PA-C  Micah Flesher, [place] or   Judy Pimple, [place]    Other Instructions

## 2021-03-07 NOTE — Progress Notes (Signed)
OFFICE NOTE  Chief Complaint:  Routine follow-up  Primary Care Physician: Luis Organ, MD  HPI:  Luis Tucker is a pleasant 70 year old male with a history of coronary artery disease. He had a stent to the circumflex in 2008. 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 He also has hypertension dyslipidemia and metabolic syndrome. Is not very physically active as been unable to lose weight recently. 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 He denies any chest pain worsening shortness of breath over the past year and recently had laboratory work provided by Dr. Kathrynn Tucker at [facility] care which showed a well-controlled lipid profile.  He presented last week to [facility] with worsening chest pain concerning for angina.   His EKG showed SR, J point elevation and inferolateral T waves different from previous ECGs. He was admitted for further evaluation and placed on IV heparin. His home triamterene/HCTZ and losartan were held on admission due to Cr 1.29. Upon further review it appears prior Cr in 2011 was 1.46 so he likely has CKD stage III. Troponins remained negative. LDL 69. Had hypokalemia which required repletion (likely r/t prior HCTZ use). He underwent cardiac cath showing severe ostial LCx disease s/p angioplasty/Synergy stent placement with moderate prox LAD stenosis. LAD disease did not appear to be different than recent path. Patent mid LCx stent. Normal EF and LVEDP. DAPT for at least 1 year recommended - started on Effient.    [name] returns for follow-up. He was recently seen by Luis Fredrickson, NP, who felt that he was doing well. No medication changes were made. He subsequently has seen his primary care provider and was advised that he needed at DOT physical. He denies any further angina or shortness of breath.  [name] is seen today in hospital follow-up. He reports feeling fairly well although  noted his heart rate is been elevated. We went through his extensive medication list and it seems like he is taking several medicines that he probably should not be on. He was taking aspirin 325 and 81 mg as well as Effient. He's also taking HCTZ and Dyazide. In addition he still on indoor which is likely not necessary post bypass. He reports some soreness to his chest wall and numbness but he denies any cardiac chest pain. He does do some walking and gets some mild shortness of breath. EKG shows sinus tachycardia 105.  [name] returns today for follow-up. He was last seen 3 months ago. We had to correct some medication irregularities. He since has been taking his medications as we prescribed. He denies any chest pain or worsening shortness of breath. He is unfortunately gained some weight due to inactivity. He is very interested in going back to work. His surgeon Dr. Cornelius Tucker feels that it's okay for him to go back from a surgical standpoint as of tomorrow, 04/03/2016. His DOT evaluator is recommending either stress test or echocardiography and as his perioperative TEE indicated an EF of 50%, I would like to repeat echocardiogram to see if LV function has improved. From a clinical standpoint he's doing very well.  09/28/2016  [name] is seen today in follow-up. Overall he is doing well however he has had weight gain since his last office visit. He was seen in the interim for evaluation of some hand swelling and his Dyazide was stopped and he was put on Lasix. He says  that Lasix is intolerable doesn't seem to help his swelling much. He wants to go back to Dyazide. He's also reported some problems with erectile dysfunction. He is interested in Levitra. He's taken it previously with some benefit. He denies any chest pain or worsening shortness of breath.  05/10/2017  Luis Tucker is seen today in follow-up. He is contemplating left knee surgery. He denies any new chest pain or worsening shortness of breath however  has had increasing weight gain. He is less active than he has been in the past but still continues to work as a Naval architect. He requires DOT stress testing every other year. He underwent recent LIMA to LAD and free radial graft to the OM 2 and January 2017. He seems to be doing well with this. An EKG in the office today however shows new inferior and lateral T-wave inversions. Etiology of this is unclear.   11/14/2018  [name] is seen today in routine follow-up.  He did undergo left knee surgery last year.  He said his ED where she would have done it sooner.  Unfortunately he continued to gain some weight.  Recently he is lost 20 pounds but overall he is up quite a bit.  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  He denies any chest pain.  He has not had a recent repeat lipid profile.  He is still having issues with ED and is requesting Levitra today.  11/12/2019  [name] is seen for annual follow-up.  He recently underwent stress testing which was negative for ischemia however showed an LVEF of 48%.  This could have been artifactual or might be related to some degree of uncontrolled hypertension.  Blood pressure initially today was 141/97 however recheck came down to 124/80.  Weight has stayed elevated although he exercises regularly.  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  He wants to maintain his DOT just in case he would consider working again.  He has no chest pain or worsening shortness of breath.  He continues to have some issues with erectile dysfunction and was on Levitra 10mg  as needed which she says has not been effective.  03/07/2021  [name] returns today for follow-up.  Overall he says he is feeling well except he gained weight recently.  He stopped working 2 years ago and has been having some trouble with his left knee.  He denies any chest pain or shortness of  breath.  Showed total cholesterol 103, triglycerides 112 HDL 27 and LDL 55.  He still reports using Levitra but says that sometimes is not as effective for him.  He is also is being seen at the [facility] hospital.  He said he would be interested in weight management if he cannot get the weight down on his own.  PMHx:  Past Medical History:  Diagnosis Date  . Arthritis    "little in my knees" (12/29/2015)  . CAD (coronary artery disease)    a. h/o stent to Cx 2008. b. cath 03/2009 demonstrated patent stent to circumflex, 60% LAD stenosis. c. 04/2009 06/2015: severe ostial LCx disease s/p angioplasty/Synergy stent placement. Moderate prox LAD disease, unchanged from prior, patent LCx stent, normal EF.  . CKD (chronic kidney disease), stage III (HCC)   . Dyslipidemia   . GERD (gastroesophageal reflux disease)   . Hepatitis    "  noncontagious type"  . History of non-ST elevation myocardial infarction (NSTEMI) 03/2007   4.0 x 15 mm Vision BMS to mCFX  . History of nuclear stress test 2014   exercise myoview; perfusion defect consistent with infarct/scar (basal/mid/apical inferior regions); low risk scan   . Hypertension   . Kidney stones   . Metabolic syndrome   . Pre-diabetes   . 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 CARDIAC CATHETERIZATION  2010   [place], LAD 60%, CFX 60-70%, stent OK, RCA OK, EF 60%  . CARDIAC CATHETERIZATION N/A 07/20/2015   Procedure: Left Heart Cath and Coronary Angiography;  Surgeon: Iran Ouch, MD;  Location: [facility] INVASIVE CV LAB;  Service: Cardiovascular;  Laterality: N/A;  . CARDIAC CATHETERIZATION N/A 07/20/2015   Procedure: Coronary Stent Intervention;  Surgeon: Iran Ouch, MD;  Location: [facility] INVASIVE CV LAB;  Service: Cardiovascular;  Laterality: N/A;  . CARDIAC CATHETERIZATION N/A 12/29/2015   Procedure: Left Heart Cath and Coronary Angiography;  Surgeon: Runell Gess, MD;  Location: [facility] INVASIVE  CV LAB;  Service: Cardiovascular;  Laterality: N/A;  . CORONARY ANGIOPLASTY WITH STENT PLACEMENT  03/2007   4.0 x 15-mm Vision BMS mCFX; 60% residual LAD  . CORONARY ARTERY BYPASS GRAFT N/A 01/04/2016   Procedure: CORONARY ARTERY BYPASS GRAFTING (CABG) x two using left internal mammary artery and left radial artery ;  Surgeon: Purcell Nails, MD;  Location: [facility] OR;  Service: Open Heart Surgery;  Laterality: N/A;  . KNEE ARTHROSCOPY Left 2004; 2011  . LYMPH NODE DISSECTION Right 1973   "groin"  . RADIAL ARTERY HARVEST Left 01/04/2016   Procedure: RADIAL ARTERY HARVEST;  Surgeon: Purcell Nails, MD;  Location: [facility] OR;  Service: Open Heart Surgery;  Laterality: Left;  . TEE WITHOUT CARDIOVERSION N/A 01/04/2016   Procedure: TRANSESOPHAGEAL ECHOCARDIOGRAM (TEE);  Surgeon: Purcell Nails, MD;  Location: [facility] OR;  Service: Open Heart Surgery;  Laterality: N/A;  . TOTAL KNEE ARTHROPLASTY Left 07/15/2017   Procedure: LEFT TOTAL KNEE ARTHROPLASTY;  Surgeon: Durene Romans, MD;  Location: [facility] ORS;  Service: Orthopedics;  Laterality: Left;  90 mins   . TRANSTHORACIC ECHOCARDIOGRAM  04/2007   EF >55%; mild concentric LVH; borderline RV enlargement; RA & LA mildly dilated; mild MR, mild TR, mild PV regurg    FAMHx:  Family History  Problem Relation Age of Onset  . Cancer - Prostate Father     SOCHx:   reports that he quit smoking about 13 years ago. His smoking use included cigarettes. He has a 35.00 pack-year smoking history. He has never used smokeless tobacco. He reports current alcohol use of about 1.0 standard drink of alcohol per week. He reports current drug use. Drug: Marijuana.  ALLERGIES:  No Known Allergies  ROS: Pertinent items noted in HPI and remainder of comprehensive ROS otherwise negative.  HOME MEDS: Current Outpatient Medications  Medication Sig Dispense Refill  . atorvastatin (LIPITOR) 80 MG tablet Take 1 tablet (80 mg total) by mouth daily. KEEP OV. 90 tablet 2  . carvedilol  (COREG) 12.5 MG tablet TAKE 1 TABLET (12.5 MG TOTAL) BY MOUTH 2 (TWO) TIMES DAILY WITH A MEAL. 30 tablet 1  . furosemide (LASIX) 20 MG tablet TAKE 1 TABLET BY MOUTH DAILY. MAY TAKE EXTRA TABLET DAILY AS NEEDED FOR SWELLING. (Patient taking differently: No sig reported) 60 tablet 5  . losartan (COZAAR) 25  MG tablet TAKE 1 TABLET (25 MG TOTAL) BY MOUTH DAILY. KEEP OV. 30 tablet 0  . nitroGLYCERIN (NITROSTAT) 0.4 MG SL tablet Place 1 tablet (0.4 mg total) under the tongue every 5 (five) minutes as needed for chest pain. 25 tablet 3  . pantoprazole (PROTONIX) 40 MG tablet Take 1 tablet (40 mg total) by mouth daily as needed (acid reflux). 90 tablet 3  . triamterene-hydrochlorothiazide (DYAZIDE) 37.5-25 MG capsule Take 1 each (1 capsule total) by mouth daily. 90 capsule 0  . vardenafil (LEVITRA) 20 MG tablet Take 1 tablet (20 mg total) by mouth daily as needed for erectile dysfunction. 20 tablet 1   No current facility-administered medications for this visit.    LABS/IMAGING: No results found for this or any previous visit (from the past 48 hour(s)). No results found.  VITALS: BP 125/73   Pulse 62   Ht 5\' 11"  (1.803 m)   Wt 252 lb (114.3 kg)   SpO2 99%   BMI 35.15 kg/m   EXAM: General appearance: alert, no distress and mildly obese Neck: no carotid bruit and no JVD Lungs: clear to auscultation bilaterally Heart: regular rate and rhythm, S1, S2 normal, no murmur, click, rub or gallop and no  Abdomen: soft, non-tender; bowel sounds normal; no masses,  no organomegaly Extremities: extremities normal, atraumatic, no cyanosis or edema and . Pulses: 2+ and symmetric Skin: Skin color, texture, turgor normal. No rashes or lesions Neurologic: Grossly normal  EKG: Normal sinus rhythm at 62, nonspecific IVCD-personally reviewed  ASSESSMENT: 1. Abnormal EKG with inferior lateral T-wave changes 2. Coronary artery disease status post PCI to the circumflex in 2008 - and PCI to the ostial left  circumflex with a synergy DES (2016) 3. ISR with CABG x 2 with LIMA to LAD and free radial graft to OM2 (12/2015) 4. Metabolic syndrome 5. Hypertension 6. Hyperlipidemia 7. ED  PLAN: 1.   [name] continues to do well denies any chest pain or shortness of breath.  He has had weight gain recently due to inactivity.  He is no longer working.  He is he is getting [facility].  He says he wants to work on the weight loss on his own and would like to see me back in 6 months.  If we have not had a significant improvement by then, would consider referral to weight management.  We will refill his ED medication today.  Plan follow-up with me in 6 months or sooner as necessary.  Chrystie NoseKenneth C. Hilty, MD, Bradford Regional Medical CenterFACC, FACP  [place]  [facility]  Medical Director of the [facility]  Cardiovascular Risk Reduction [facility] Diplomate of the American Board of Clinical Lipidology Attending Cardiologist  Direct Dial: [phone]  Fax: [phone]  Website:  [email or web address]   Kenneth C Hilty 03/07/2021, 10:00 AM

## 2021-03-11 ENCOUNTER — Other Ambulatory Visit: Payer: Self-pay | Admitting: Internal Medicine

## 2021-03-19 ENCOUNTER — Other Ambulatory Visit: Payer: Self-pay | Admitting: Internal Medicine

## 2021-06-07 ENCOUNTER — Other Ambulatory Visit: Payer: Self-pay

## 2021-06-07 MED ORDER — TRIAMTERENE-HCTZ 37.5-25 MG PO CAPS: 1.0000 | ORAL_CAPSULE | Freq: Every day | ORAL | 0 refills | Status: DC

## 2021-09-12 ENCOUNTER — Other Ambulatory Visit: Payer: Self-pay | Admitting: Internal Medicine

## 2021-09-12 ENCOUNTER — Telehealth: Payer: Self-pay | Admitting: Internal Medicine

## 2021-09-12 NOTE — Telephone Encounter (Signed)
*  STAT* If patient is at the pharmacy, call can be transferred to refill team.   1. Which medications need to be refilled? (please list name of each medication and dose if known) triamterene-hydrochlorothiazide (DYAZIDE) 37.5-25 MG capsule  2. Which pharmacy/location (including street and city if local pharmacy) is medication to be sent to? [id number] - [place], [place] - [address]  3. Do they need a 30 day or 90 day supply? 90 ds

## 2021-09-12 NOTE — Telephone Encounter (Signed)
Refills has been sent to the pharmacy. 

## 2021-10-24 ENCOUNTER — Ambulatory Visit: Payer: Medicare HMO | Admitting: Internal Medicine

## 2021-10-24 ENCOUNTER — Other Ambulatory Visit: Payer: Self-pay

## 2021-10-24 ENCOUNTER — Encounter: Payer: Self-pay | Admitting: Internal Medicine

## 2021-10-24 VITALS — BP 136/80 | HR 51 | Ht 72.0 in | Wt 248.4 lb

## 2021-10-24 DIAGNOSIS — N528 Other male erectile dysfunction: Secondary | ICD-10-CM

## 2021-10-24 DIAGNOSIS — I10 Essential (primary) hypertension: Secondary | ICD-10-CM | POA: Diagnosis not present

## 2021-10-24 DIAGNOSIS — E782 Mixed hyperlipidemia: Secondary | ICD-10-CM | POA: Diagnosis not present

## 2021-10-24 DIAGNOSIS — Z951 Presence of aortocoronary bypass graft: Secondary | ICD-10-CM | POA: Diagnosis not present

## 2021-10-24 MED ORDER — TADALAFIL 10 MG PO TABS: ORAL_TABLET | ORAL | 1 refills | Status: DC

## 2021-10-24 MED ORDER — NITROGLYCERIN 0.4 MG SL SUBL: 0.4000 mg | SUBLINGUAL_TABLET | SUBLINGUAL | 3 refills | Status: DC | PRN

## 2021-10-24 NOTE — Progress Notes (Signed)
OFFICE NOTE  Chief Complaint:  Routine follow-up  Primary Care Physician: Jettie Booze, NP  HPI:  Luis Tucker is a pleasant 70 year old male with a history of coronary artery disease. He had a stent to the circumflex in 2008. He works as a Administrator and needs annual DOT visits as well as stress test every 2 years. He also has hypertension dyslipidemia and metabolic syndrome. Is not very physically active as been unable to lose weight recently. 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 He denies any chest pain worsening shortness of breath over the past year and recently had laboratory work provided by Dr. Tammi Klippel at [facility] care which showed a well-controlled lipid profile.  He presented last week to [facility] with worsening chest pain concerning for angina.   His EKG showed SR, J point elevation and inferolateral T waves different from previous ECGs. He was admitted for further evaluation and placed on IV heparin. His home triamterene/HCTZ and losartan were held on admission due to Cr 1.29. Upon further review it appears prior Cr in 2011 was 1.46 so he likely has CKD stage III. Troponins remained negative. LDL 69. Had hypokalemia which required repletion (likely r/t prior HCTZ use). He underwent cardiac cath showing severe ostial LCx disease s/p angioplasty/Synergy stent placement with moderate prox LAD stenosis. LAD disease did not appear to be different than recent path. Patent mid LCx stent. Normal EF and LVEDP. DAPT for at least 1 year recommended - started on Effient.    [name] returns for follow-up. He was recently seen by Truitt Merle, NP, who felt that he was doing well. No medication changes were made. He subsequently has seen his primary care provider and was advised that he needed at DOT physical. He denies any further angina or shortness of breath.  [name] is seen today in hospital follow-up. He reports feeling fairly well although noted  his heart rate is been elevated. We went through his extensive medication list and it seems like he is taking several medicines that he probably should not be on. He was taking aspirin 325 and 81 mg as well as Effient. He's also taking HCTZ and Dyazide. In addition he still on indoor which is likely not necessary post bypass. He reports some soreness to his chest wall and numbness but he denies any cardiac chest pain. He does do some walking and gets some mild shortness of breath. EKG shows sinus tachycardia 105.  [name] returns today for follow-up. He was last seen 3 months ago. We had to correct some medication irregularities. He since has been taking his medications as we prescribed. He denies any chest pain or worsening shortness of breath. He is unfortunately gained some weight due to inactivity. He is very interested in going back to work. His surgeon Dr. Roxy Manns feels that it's okay for him to go back from a surgical standpoint as of tomorrow, 04/03/2016. His DOT evaluator is recommending either stress test or echocardiography and as his perioperative TEE indicated an EF of 50%, I would like to repeat echocardiogram to see if LV function has improved. From a clinical standpoint he's doing very well.  09/28/2016  [name] is seen today in follow-up. Overall he is doing well however he has had weight gain since his last office visit. He was seen in the interim for evaluation of some hand swelling and his Dyazide was stopped and he was put on Lasix. He says  that Lasix is intolerable doesn't seem to help his swelling much. He wants to go back to Dyazide. He's also reported some problems with erectile dysfunction. He is interested in Levitra. He's taken it previously with some benefit. He denies any chest pain or worsening shortness of breath.  05/10/2017  [name] is seen today in follow-up. He is contemplating left knee surgery. He denies any new chest pain or worsening shortness of breath however has had  increasing weight gain. He is less active than he has been in the past but still continues to work as a Naval architect. He requires DOT stress testing every other year. He underwent recent LIMA to LAD and free radial graft to the OM 2 and January 2017. He seems to be doing well with this. An EKG in the office today however shows new inferior and lateral T-wave inversions. Etiology of this is unclear.   11/14/2018  [name] is seen today in routine follow-up.  He did undergo left knee surgery last year.  He said his ED where she would have done it sooner.  Unfortunately he continued to gain some weight.  Recently he is lost 20 pounds but overall he is up quite a bit.  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  He denies any chest pain.  He has not had a recent repeat lipid profile.  He is still having issues with ED and is requesting Levitra today.  11/12/2019  [name] is seen for annual follow-up.  He recently underwent stress testing which was negative for ischemia however showed an LVEF of 48%.  This could have been artifactual or might be related to some degree of uncontrolled hypertension.  Blood pressure initially today was 141/97 however recheck came down to 124/80.  Weight has stayed elevated although he exercises regularly.  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  He wants to maintain his DOT just in case he would consider working again.  He has no chest pain or worsening shortness of breath.  He continues to have some issues with erectile dysfunction and was on Levitra 10mg  as needed which she says has not been effective.  03/07/2021  [name] returns today for follow-up.  Overall he says he is feeling well except he gained weight recently.  He stopped working 2 years ago and has been having some trouble with his left knee.  He denies any chest pain or shortness of breath.   Showed total cholesterol 103, triglycerides 112 HDL 27 and LDL 55.  He still reports using Levitra but says that sometimes is not as effective for him.  He is also is being seen at the [facility] hospital.  He said he would be interested in weight management if he cannot get the weight down on his own.  10/24/2021  [name] seen today in follow-up.  He seems to be doing well from a cardiac standpoint.  He denies any chest pain or worsening shortness of breath.  He continues to get care through the [facility] hospital in [place].  While he says he has had some recent lipids.  The last numbers I saw were in February 2022 showing total cholesterol 103, triglycerides 112, HDL 27 and LDL 55.  This represents excellent control.  Blood pressure is good today at 136/80.  Weight is still more than ideal.  He  reports little benefit from Levitra.  He still has some issues with erectile dysfunction and is wondering what other options he may have.  PMHx:  Past Medical History:  Diagnosis Date   Arthritis    "little in my knees" (12/29/2015)   CAD (coronary artery disease)    a. h/o stent to Cx 2008. b. cath 03/2009 demonstrated patent stent to circumflex, 60% LAD stenosis. c. [place] 06/2015: severe ostial LCx disease s/p angioplasty/Synergy stent placement. Moderate prox LAD disease, unchanged from prior, patent LCx stent, normal EF.   CKD (chronic kidney disease), stage III (HCC)    Dyslipidemia    GERD (gastroesophageal reflux disease)    Hepatitis    "noncontagious type"   History of non-ST elevation myocardial infarction (NSTEMI) 03/2007   4.0 x 15 mm Vision BMS to mCFX   History of nuclear stress test 2014   exercise myoview; perfusion defect consistent with infarct/scar (basal/mid/apical inferior regions); low risk scan    Hypertension    Kidney stones    Metabolic syndrome    Pre-diabetes    S/P CABG x 2 01/04/2016   LIMA to LAD, LRA to OM2    Past Surgical History:  Procedure Laterality Date   CARDIAC  CATHETERIZATION  2010   [place], LAD 60%, CFX 60-70%, stent OK, RCA OK, EF 60%   CARDIAC CATHETERIZATION N/A 07/20/2015   Procedure: Left Heart Cath and Coronary Angiography;  Surgeon: Iran Ouch, MD;  Location: [facility] INVASIVE CV LAB;  Service: Cardiovascular;  Laterality: N/A;   CARDIAC CATHETERIZATION N/A 07/20/2015   Procedure: Coronary Stent Intervention;  Surgeon: Iran Ouch, MD;  Location: [facility] INVASIVE CV LAB;  Service: Cardiovascular;  Laterality: N/A;   CARDIAC CATHETERIZATION N/A 12/29/2015   Procedure: Left Heart Cath and Coronary Angiography;  Surgeon: Runell Gess, MD;  Location: [facility] INVASIVE CV LAB;  Service: Cardiovascular;  Laterality: N/A;   CORONARY ANGIOPLASTY WITH STENT PLACEMENT  03/2007   4.0 x 15-mm Vision BMS mCFX; 60% residual LAD   CORONARY ARTERY BYPASS GRAFT N/A 01/04/2016   Procedure: CORONARY ARTERY BYPASS GRAFTING (CABG) x two using left internal mammary artery and left radial artery ;  Surgeon: Purcell Nails, MD;  Location: [facility] OR;  Service: Open Heart Surgery;  Laterality: N/A;   KNEE ARTHROSCOPY Left 2004; 2011   LYMPH NODE DISSECTION Right 1973   "groin"   RADIAL ARTERY HARVEST Left 01/04/2016   Procedure: RADIAL ARTERY HARVEST;  Surgeon: Purcell Nails, MD;  Location: [facility] OR;  Service: Open Heart Surgery;  Laterality: Left;   TEE WITHOUT CARDIOVERSION N/A 01/04/2016   Procedure: TRANSESOPHAGEAL ECHOCARDIOGRAM (TEE);  Surgeon: Purcell Nails, MD;  Location: [facility] OR;  Service: Open Heart Surgery;  Laterality: N/A;   TOTAL KNEE ARTHROPLASTY Left 07/15/2017   Procedure: LEFT TOTAL KNEE ARTHROPLASTY;  Surgeon: Durene Romans, MD;  Location: [facility] ORS;  Service: Orthopedics;  Laterality: Left;  90 mins    TRANSTHORACIC ECHOCARDIOGRAM  04/2007   EF >55%; mild concentric LVH; borderline RV enlargement; RA & LA mildly dilated; mild MR, mild TR, mild PV regurg    FAMHx:  Family History  Problem Relation Age of Onset   Cancer - Prostate Father     SOCHx:    reports that he quit smoking about 14 years ago. His smoking use included cigarettes. He has a 35.00 pack-year smoking history. He has never used smokeless tobacco. He reports current alcohol use of about 1.0 standard drink per week. He reports current  drug use. Drug: Marijuana.  ALLERGIES:  No Known Allergies  ROS: Pertinent items noted in HPI and remainder of comprehensive ROS otherwise negative.  HOME MEDS: Current Outpatient Medications  Medication Sig Dispense Refill   atorvastatin (LIPITOR) 80 MG tablet Take 1 tablet (80 mg total) by mouth daily. KEEP OV. 90 tablet 2   carvedilol (COREG) 12.5 MG tablet TAKE 1 TABLET (12.5 MG TOTAL) BY MOUTH 2 (TWO) TIMES DAILY WITH A MEAL. 180 tablet 3   furosemide (LASIX) 20 MG tablet TAKE 1 TABLET BY MOUTH DAILY. MAY TAKE EXTRA TABLET DAILY AS NEEDED FOR SWELLING. (Patient taking differently: No sig reported) 60 tablet 5   losartan (COZAAR) 25 MG tablet Take 1 tablet (25 mg total) by mouth daily. 30 tablet 10   nitroGLYCERIN (NITROSTAT) 0.4 MG SL tablet Place 1 tablet (0.4 mg total) under the tongue every 5 (five) minutes as needed for chest pain. 25 tablet 3   pantoprazole (PROTONIX) 40 MG tablet Take 1 tablet (40 mg total) by mouth daily as needed (acid reflux). 90 tablet 3   triamterene-hydrochlorothiazide (DYAZIDE) 37.5-25 MG capsule TAKE 1 EACH (1 CAPSULE TOTAL) BY MOUTH DAILY. 90 capsule 1   vardenafil (LEVITRA) 20 MG tablet Take 1 tablet (20 mg total) by mouth daily as needed for erectile dysfunction. 20 tablet 3   No current facility-administered medications for this visit.    LABS/IMAGING: No results found for this or any previous visit (from the past 48 hour(s)). No results found.  VITALS: BP 136/80   Pulse (!) 51   Ht 6' (1.829 m)   Wt 248 lb 6.4 oz (112.7 kg)   SpO2 97%   BMI 33.69 kg/m   EXAM: General appearance: alert, no distress and mildly obese Neck: no carotid bruit and no JVD Lungs: clear to auscultation  bilaterally Heart: regular rate and rhythm, S1, S2 normal, no murmur, click, rub or gallop and no  Abdomen: soft, non-tender; bowel sounds normal; no masses,  no organomegaly Extremities: extremities normal, atraumatic, no cyanosis or edema and . Pulses: 2+ and symmetric Skin: Skin color, texture, turgor normal. No rashes or lesions Neurologic: Grossly normal  EKG: Sinus bradycardia 51, nonspecific IVCD, inferior and anterolateral T wave inversions, suspect repolarization abnormality-personally reviewed  ASSESSMENT: Abnormal EKG with inferior lateral T-wave changes Coronary artery disease status post PCI to the circumflex in 2008 - and PCI to the ostial left circumflex with a synergy DES (2016) ISR with CABG x 2 with LIMA to LAD and free radial graft to OM2 (12/2015) Metabolic syndrome Hypertension Hyperlipidemia ED  PLAN: 1.   [name] feels well and has good blood pressure and cholesterol control.  He continues to have issues with ED.  Levitra was not helpful.  He says he did not have good success with Viagra.  He was wishing to try Cialis.  We will go ahead with a 10 mg dose to use 30 to 60 minutes prior to sexual activity.  He understands this last for approximately 36 hours and he is not to use any nitrates at the same time.  Plan follow-up with me annually or sooner as necessary.  Chrystie Nose, MD, [facility], FACP  [place]  [facility]  Medical Director of the Advanced Lipid Disorders &  Cardiovascular Risk Reduction Clinic Diplomate of the American Board of Clinical Lipidology Attending Cardiologist  Direct Dial: [phone]  Fax: [phone]  Website:  [email or web address] Nicely Kirston Luty 10/24/2021, 9:26 AM

## 2021-10-24 NOTE — Patient Instructions (Addendum)
Medication Instructions:  STOP Levitra    Take Cialis 10 mg 30-60 minutes before sexual relations.    I refilled your nitroglycerin  *If you need a refill on your cardiac medications before your next appointment, please call your pharmacy*   Lab Work: None today  If you have labs (blood work) drawn today and your tests are completely normal, you will receive your results only by: MyChart Message (if you have MyChart) OR A paper copy in the mail If you have any lab test that is abnormal or we need to change your treatment, we will call you to review the results.   Testing/Procedures: None today    Follow-Up: At [facility], you and your health needs are our priority.  As part of our continuing mission to provide you with exceptional heart care, we have created designated Provider Care Teams.  These Care Teams include your primary Cardiologist (physician) and Advanced Practice Providers (APPs -  Physician Assistants and Nurse Practitioners) who all work together to provide you with the care you need, when you need it.  We recommend signing up for the patient portal called "MyChart".  Sign up information is provided on this After Visit Summary.  MyChart is used to connect with patients for Virtual Visits (Telemedicine).  Patients are able to view lab/test results, encounter notes, upcoming appointments, etc.  Non-urgent messages can be sent to your provider as well.   To learn more about what you can do with MyChart, go to [email or web address].    Your next appointment:   12 month(s)  The format for your next appointment:   In Person  Provider:   Dr.Kj Imbert   Other Instructions None

## 2021-11-27 ENCOUNTER — Telehealth: Payer: Self-pay

## 2021-11-27 MED ORDER — ATORVASTATIN CALCIUM 80 MG PO TABS: 80.0000 mg | ORAL_TABLET | Freq: Every day | ORAL | 2 refills | Status: DC

## 2021-11-27 NOTE — Telephone Encounter (Signed)
Faxed refill request received from [facility] for Atorvastatin 80 mg, one tab daily. Refill sent to pts pharmacy per request.

## 2021-11-29 ENCOUNTER — Other Ambulatory Visit: Payer: Self-pay

## 2021-11-29 MED ORDER — ATORVASTATIN CALCIUM 80 MG PO TABS: 80.0000 mg | ORAL_TABLET | Freq: Every day | ORAL | 3 refills | Status: DC

## 2021-11-30 ENCOUNTER — Other Ambulatory Visit: Payer: Self-pay | Admitting: Internal Medicine

## 2022-02-28 ENCOUNTER — Other Ambulatory Visit: Payer: Self-pay | Admitting: Internal Medicine

## 2022-03-02 ENCOUNTER — Other Ambulatory Visit: Payer: Self-pay

## 2022-03-02 MED ORDER — PANTOPRAZOLE SODIUM 40 MG PO TBEC: 40.0000 mg | DELAYED_RELEASE_TABLET | Freq: Every day | ORAL | 3 refills | Status: DC | PRN

## 2022-03-03 ENCOUNTER — Other Ambulatory Visit: Payer: Self-pay | Admitting: Internal Medicine

## 2022-03-23 ENCOUNTER — Ambulatory Visit: Payer: Medicare HMO | Admitting: Internal Medicine

## 2022-05-31 ENCOUNTER — Other Ambulatory Visit: Payer: Self-pay | Admitting: Internal Medicine

## 2022-08-24 ENCOUNTER — Other Ambulatory Visit: Payer: Self-pay | Admitting: Internal Medicine

## 2023-01-01 ENCOUNTER — Ambulatory Visit (HOSPITAL_BASED_OUTPATIENT_CLINIC_OR_DEPARTMENT_OTHER): Payer: Medicare HMO | Admitting: Internal Medicine

## 2023-01-01 VITALS — BP 126/78 | HR 74 | Ht 71.5 in | Wt 260.0 lb

## 2023-01-01 DIAGNOSIS — E8881 Metabolic syndrome: Secondary | ICD-10-CM | POA: Diagnosis not present

## 2023-01-01 DIAGNOSIS — E668 Other obesity: Secondary | ICD-10-CM

## 2023-01-01 DIAGNOSIS — Z951 Presence of aortocoronary bypass graft: Secondary | ICD-10-CM | POA: Diagnosis not present

## 2023-01-01 DIAGNOSIS — I2510 Atherosclerotic heart disease of native coronary artery without angina pectoris: Secondary | ICD-10-CM

## 2023-01-01 DIAGNOSIS — I10 Essential (primary) hypertension: Secondary | ICD-10-CM

## 2023-01-01 DIAGNOSIS — I2583 Coronary atherosclerosis due to lipid rich plaque: Secondary | ICD-10-CM

## 2023-01-01 NOTE — Patient Instructions (Signed)
Medication Instructions:  NO CHANGES  *If you need a refill on your cardiac medications before your next appointment, please call your pharmacy*   Follow-Up: At [facility] you and your health needs are our priority.  As part of our continuing mission to provide you with exceptional heart care, we have created designated Provider Care Teams.  These Care Teams include your primary Cardiologist (physician) and Advanced Practice Providers (APPs -  Physician Assistants and Nurse Practitioners) who all work together to provide you with the care you need, when you need it.  We recommend signing up for the patient portal called "MyChart".  Sign up information is provided on this After Visit Summary.  MyChart is used to connect with patients for Virtual Visits (Telemedicine).  Patients are able to view lab/test results, encounter notes, upcoming appointments, etc.  Non-urgent messages can be sent to your provider as well.   To learn more about what you can do with MyChart, go to [email or web address].    Your next appointment:    12 months with Dr. Theresa Duty have been referred to Medical Weight Management

## 2023-01-01 NOTE — Progress Notes (Signed)
OFFICE NOTE  Chief Complaint:  Routine follow-up  Primary Care Physician: Jettie Booze, NP  HPI:  Luis Tucker is a pleasant 72 year old male with a history of coronary artery disease. He had a stent to the circumflex in 2008. He works as a Administrator and needs annual DOT visits as well as stress test every 2 years. He also has hypertension dyslipidemia and metabolic syndrome. Is not very physically active as been unable to lose weight recently. 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 He denies any chest pain worsening shortness of breath over the past year and recently had laboratory work provided by Dr. Tammi Klippel at [facility] care which showed a well-controlled lipid profile.  He presented last week to [facility] with worsening chest pain concerning for angina.   His EKG showed SR, J point elevation and inferolateral T waves different from previous ECGs. He was admitted for further evaluation and placed on IV heparin. His home triamterene/HCTZ and losartan were held on admission due to Cr 1.29. Upon further review it appears prior Cr in 2011 was 1.46 so he likely has CKD stage III. Troponins remained negative. LDL 69. Had hypokalemia which required repletion (likely r/t prior HCTZ use). He underwent cardiac cath showing severe ostial LCx disease s/p angioplasty/Synergy stent placement with moderate prox LAD stenosis. LAD disease did not appear to be different than recent path. Patent mid LCx stent. Normal EF and LVEDP. DAPT for at least 1 year recommended - started on Effient.    Luis Tucker returns for follow-up. He was recently seen by Truitt Merle, NP, who felt that he was doing well. No medication changes were made. He subsequently has seen his primary care provider and was advised that he needed at DOT physical. He denies any further angina or shortness of breath.  Luis Tucker is seen today in hospital follow-up. He reports feeling fairly well although noted  his heart rate is been elevated. We went through his extensive medication list and it seems like he is taking several medicines that he probably should not be on. He was taking aspirin 325 and 81 mg as well as Effient. He's also taking HCTZ and Dyazide. In addition he still on indoor which is likely not necessary post bypass. He reports some soreness to his chest wall and numbness but he denies any cardiac chest pain. He does do some walking and gets some mild shortness of breath. EKG shows sinus tachycardia 105.  Luis Tucker returns today for follow-up. He was last seen 3 months ago. We had to correct some medication irregularities. He since has been taking his medications as we prescribed. He denies any chest pain or worsening shortness of breath. He is unfortunately gained some weight due to inactivity. He is very interested in going back to work. His surgeon Dr. Roxy Manns feels that it's okay for him to go back from a surgical standpoint as of tomorrow, 04/03/2016. His DOT evaluator is recommending either stress test or echocardiography and as his perioperative TEE indicated an EF of 50%, I would like to repeat echocardiogram to see if LV function has improved. From a clinical standpoint he's doing very well.  09/28/2016  Luis Tucker is seen today in follow-up. Overall he is doing well however he has had weight gain since his last office visit. He was seen in the interim for evaluation of some hand swelling and his Dyazide was stopped and he was put on Lasix. He says  that Lasix is intolerable doesn't seem to help his swelling much. He wants to go back to Dyazide. He's also reported some problems with erectile dysfunction. He is interested in Levitra. He's taken it previously with some benefit. He denies any chest pain or worsening shortness of breath.  05/10/2017  Luis Tucker is seen today in follow-up. He is contemplating left knee surgery. He denies any new chest pain or worsening shortness of breath however has had  increasing weight gain. He is less active than he has been in the past but still continues to work as a Naval architect. He requires DOT stress testing every other year. He underwent recent LIMA to LAD and free radial graft to the OM 2 and January 2017. He seems to be doing well with this. An EKG in the office today however shows new inferior and lateral T-wave inversions. Etiology of this is unclear.   11/14/2018  Luis Tucker is seen today in routine follow-up.  He did undergo left knee surgery last year.  He said his ED where she would have done it sooner.  Unfortunately he continued to gain some weight.  Recently he is lost 20 pounds but overall he is up quite a bit.  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  He denies any chest pain.  He has not had a recent repeat lipid profile.  He is still having issues with ED and is requesting Levitra today.  11/12/2019  Luis Tucker is seen for annual follow-up.  He recently underwent stress testing which was negative for ischemia however showed an LVEF of 48%.  This could have been artifactual or might be related to some degree of uncontrolled hypertension.  Blood pressure initially today was 141/97 however recheck came down to 124/80.  Weight has stayed elevated although he exercises regularly.  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  He wants to maintain his DOT just in case he would consider working again.  He has no chest pain or worsening shortness of breath.  He continues to have some issues with erectile dysfunction and was on Levitra 10mg  as needed which she says has not been effective.  03/07/2021  Luis Tucker returns today for follow-up.  Overall he says he is feeling well except he gained weight recently.  He stopped working 2 years ago and has been having some trouble with his left knee.  He denies any chest pain or shortness of breath.   Showed total cholesterol 103, triglycerides 112 HDL 27 and LDL 55.  He still reports using Levitra but says that sometimes is not as effective for him.  He is also is being seen at the [facility] hospital.  He said he would be interested in weight management if he cannot get the weight down on his own.  10/24/2021  Luis Tucker seen today in follow-up.  He seems to be doing well from a cardiac standpoint.  He denies any chest pain or worsening shortness of breath.  He continues to get care through the [facility] hospital in [place].  While he says he has had some recent lipids.  The last numbers I saw were in February 2022 showing total cholesterol 103, triglycerides 112, HDL 27 and LDL 55.  This represents excellent control.  Blood pressure is good today at 136/80.  Weight is still more than ideal.  He  reports little benefit from Levitra.  He still has some issues with erectile dysfunction and is wondering what other options he may have.  01/01/2023  Luis Tucker is seen today in follow-up.  Overall it seems he is doing pretty well.  He has had no chest pain or worsening shortness of breath.  He is now 7 years out of his bypass surgery.  EKG today again is abnormal but not changed since his prior EKG.  Blood pressure is well-controlled.  His last lipid profile showed total cholesterol 113, triglycerides 93, HDL 29 and LDL 66.  His main concern today is his weight.  He says this has been fluctuating and he really wants to try to lose more weight.  He struggled with this and feels that he would benefit from a structured weight loss program.  PMHx:  Past Medical History:  Diagnosis Date   Arthritis    "little in my knees" (12/29/2015)   CAD (coronary artery disease)    a. h/o stent to Cx 2008. b. cath 03/2009 demonstrated patent stent to circumflex, 60% LAD stenosis. c. [place] 06/2015: severe ostial LCx disease s/p angioplasty/Synergy stent placement. Moderate prox LAD disease, unchanged from prior, patent LCx stent, normal EF.    CKD (chronic kidney disease), stage III (HCC)    Dyslipidemia    GERD (gastroesophageal reflux disease)    Hepatitis    "noncontagious type"   History of non-ST elevation myocardial infarction (NSTEMI) 03/2007   4.0 x 15 mm Vision BMS to mCFX   History of nuclear stress test 2014   exercise myoview; perfusion defect consistent with infarct/scar (basal/mid/apical inferior regions); low risk scan    Hypertension    Kidney stones    Metabolic syndrome    Pre-diabetes    S/P CABG x 2 01/04/2016   LIMA to LAD, LRA to OM2    Past Surgical History:  Procedure Laterality Date   CARDIAC CATHETERIZATION  2010   [place], LAD 60%, CFX 60-70%, stent OK, RCA OK, EF 60%   CARDIAC CATHETERIZATION N/A 07/20/2015   Procedure: Left Heart Cath and Coronary Angiography;  Surgeon: Iran Ouch, MD;  Location: [facility] INVASIVE CV LAB;  Service: Cardiovascular;  Laterality: N/A;   CARDIAC CATHETERIZATION N/A 07/20/2015   Procedure: Coronary Stent Intervention;  Surgeon: Iran Ouch, MD;  Location: [facility] INVASIVE CV LAB;  Service: Cardiovascular;  Laterality: N/A;   CARDIAC CATHETERIZATION N/A 12/29/2015   Procedure: Left Heart Cath and Coronary Angiography;  Surgeon: Runell Gess, MD;  Location: [facility] INVASIVE CV LAB;  Service: Cardiovascular;  Laterality: N/A;   CORONARY ANGIOPLASTY WITH STENT PLACEMENT  03/2007   4.0 x 15-mm Vision BMS mCFX; 60% residual LAD   CORONARY ARTERY BYPASS GRAFT N/A 01/04/2016   Procedure: CORONARY ARTERY BYPASS GRAFTING (CABG) x two using left internal mammary artery and left radial artery ;  Surgeon: Purcell Nails, MD;  Location: [facility] OR;  Service: Open Heart Surgery;  Laterality: N/A;   KNEE ARTHROSCOPY Left 2004; 2011   LYMPH NODE DISSECTION Right 1973   "groin"   RADIAL ARTERY HARVEST Left 01/04/2016   Procedure: RADIAL ARTERY HARVEST;  Surgeon: Purcell Nails, MD;  Location: [facility] OR;  Service: Open Heart Surgery;  Laterality: Left;   TEE WITHOUT CARDIOVERSION N/A 01/04/2016    Procedure: TRANSESOPHAGEAL ECHOCARDIOGRAM (TEE);  Surgeon: Purcell Nails, MD;  Location: [facility] OR;  Service: Open Heart Surgery;  Laterality: N/A;   TOTAL KNEE ARTHROPLASTY Left 07/15/2017   Procedure: LEFT  TOTAL KNEE ARTHROPLASTY;  Surgeon: Durene Romans, MD;  Location: [facility] ORS;  Service: Orthopedics;  Laterality: Left;  90 mins    TRANSTHORACIC ECHOCARDIOGRAM  04/2007   EF >55%; mild concentric LVH; borderline RV enlargement; RA & LA mildly dilated; mild MR, mild TR, mild PV regurg    FAMHx:  Family History  Problem Relation Age of Onset   Cancer - Prostate Father     SOCHx:   reports that he quit smoking about 15 years ago. His smoking use included cigarettes. He has a 35.00 pack-year smoking history. He has never used smokeless tobacco. He reports current alcohol use of about 1.0 standard drink of alcohol per week. He reports current drug use. Drug: Marijuana.  ALLERGIES:  No Known Allergies  ROS: Pertinent items noted in HPI and remainder of comprehensive ROS otherwise negative.  HOME MEDS: Current Outpatient Medications  Medication Sig Dispense Refill   atorvastatin (LIPITOR) 80 MG tablet Take 1 tablet (80 mg total) by mouth daily. 90 tablet 3   carvedilol (COREG) 12.5 MG tablet TAKE 1 TABLET (12.5 MG TOTAL) BY MOUTH 2 (TWO) TIMES DAILY WITH A MEAL. 180 tablet 3   furosemide (LASIX) 20 MG tablet TAKE 1 TABLET BY MOUTH DAILY. MAY TAKE EXTRA TABLET DAILY AS NEEDED FOR SWELLING. (Patient taking differently: No sig reported) 60 tablet 5   losartan (COZAAR) 25 MG tablet TAKE 1 TABLET (25 MG TOTAL) BY MOUTH DAILY. 90 tablet 3   nitroGLYCERIN (NITROSTAT) 0.4 MG SL tablet Place 1 tablet (0.4 mg total) under the tongue every 5 (five) minutes as needed for chest pain. 25 tablet 3   pantoprazole (PROTONIX) 40 MG tablet Take 1 tablet (40 mg total) by mouth daily as needed (acid reflux). 90 tablet 3   tadalafil (CIALIS) 10 MG tablet Take 10 mg 30 minutes to 1 hour before sexual relations 10  tablet 1   triamterene-hydrochlorothiazide (DYAZIDE) 37.5-25 MG capsule TAKE 1 CAPSULE BY MOUTH EVERY DAY 90 capsule 0   No current facility-administered medications for this visit.    LABS/IMAGING: No results found for this or any previous visit (from the past 48 hour(s)). No results found.  VITALS: BP 126/78   Pulse 74   Ht 5' 11.5" (1.816 m)   Wt 260 lb (117.9 kg)   BMI 35.76 kg/m   EXAM: General appearance: alert, no distress and mildly obese Neck: no carotid bruit and no JVD Lungs: clear to auscultation bilaterally Heart: regular rate and rhythm, S1, S2 normal, no murmur, click, rub or gallop and no  Abdomen: soft, non-tender; bowel sounds normal; no masses,  no organomegaly Extremities: extremities normal, atraumatic, no cyanosis or edema and . Pulses: 2+ and symmetric Skin: Skin color, texture, turgor normal. No rashes or lesions Neurologic: Grossly normal  EKG: Normal sinus rhythm at 74, nonspecific IVCD, voltage criteria for LVH and inferior and lateral T wave inversions-personally reviewed  ASSESSMENT: Abnormal EKG with inferior lateral T-wave changes Coronary artery disease status post PCI to the circumflex in 2008 - and PCI to the ostial left circumflex with a synergy DES (2016) ISR with CABG x 2 with LIMA to LAD and free radial graft to OM2 (12/2015) Metabolic syndrome Moderate obesity Hypertension Hyperlipidemia, goal LDL less than 70 ED  PLAN: 1.   Mr. Seeling continues to do well without any worsening chest pain or shortness of breath now 70 years out of bypass surgery.  EKG is unchanged.  His blood pressure is well-controlled and LDL is at target less  than 70.  He is interested in weight loss and we will plan to refer him to the [facility].  Plan follow-up with me annually or sooner as necessary.  Chrystie Nose, MD, [facility], FACP  [place]  [facility]  Medical Director of the [facility] Lipid Disorders &  Cardiovascular  Risk Reduction Clinic Diplomate of the American Board of Clinical Lipidology Attending Cardiologist  Direct Dial: [phone]  Fax: [phone]  Website:  [email or web address] 01/01/2023, 3:56 PM

## 2023-01-09 ENCOUNTER — Encounter: Payer: Self-pay | Admitting: Nurse Practitioner

## 2023-01-09 ENCOUNTER — Ambulatory Visit (INDEPENDENT_AMBULATORY_CARE_PROVIDER_SITE_OTHER): Payer: Medicare HMO | Admitting: Nurse Practitioner

## 2023-01-09 VITALS — BP 137/73 | HR 64 | Temp 97.9°F | Ht 69.0 in | Wt 257.0 lb

## 2023-01-09 DIAGNOSIS — I10 Essential (primary) hypertension: Secondary | ICD-10-CM

## 2023-01-09 DIAGNOSIS — Z6838 Body mass index (BMI) 38.0-38.9, adult: Secondary | ICD-10-CM

## 2023-01-09 DIAGNOSIS — E785 Hyperlipidemia, unspecified: Secondary | ICD-10-CM

## 2023-01-09 NOTE — Progress Notes (Signed)
Office: [phone]  /  Fax: [phone]   Initial Visit  Luis Tucker was seen in clinic today to evaluate for obesity. He is interested in losing weight to improve overall health and reduce the risk of weight related complications. He presents today to review program treatment options, initial physical assessment, and evaluation.     He was referred by: Specialist  When asked what else they would like to accomplish? He states: Improve existing medical conditions, Improve quality of life, and Lose a target amount of weight : 50 lbs  When asked how has your weight affected you? 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   Weight promoting medications identified: None  Current nutrition plan: None  Current level of physical activity: None  Current or previous pharmacotherapy: None  Response to medication: Never tried medications   Past medical history includes:   Past Medical History:  Diagnosis Date   Arthritis    "little in my knees" (12/29/2015)   CAD (coronary artery disease)    a. h/o stent to Cx 2008. b. cath 03/2009 demonstrated patent stent to circumflex, 60% LAD stenosis. c. [place] 06/2015: severe ostial LCx disease s/p angioplasty/Synergy stent placement. Moderate prox LAD disease, unchanged from prior, patent LCx stent, normal EF.   CKD (chronic kidney disease), stage III (HCC)    Dyslipidemia    GERD (gastroesophageal reflux disease)    Hepatitis    "noncontagious type"   History of non-ST elevation myocardial infarction (NSTEMI) 03/2007   4.0 x 15 mm Vision BMS to [place]   History of nuclear  stress test 2014   exercise myoview; perfusion defect consistent with infarct/scar (basal/mid/apical inferior regions); low risk scan    Hypertension    Kidney stones    Metabolic syndrome    Pre-diabetes    S/P CABG x 2 01/04/2016   LIMA to LAD, LRA to OM2     Objective:   BP 137/73   Pulse 64   Temp 97.9 F (36.6 C)   Ht 5\' 9"  (1.753 m)   Wt 257 lb (116.6 kg)   SpO2 97%   BMI 37.95 kg/m  He was weighed on the bioimpedance scale: Body mass index is 37.95 kg/m.  Peak Weight:270 lbs , Body Fat%:33.4, Visceral Fat Rating:22, Weight trend over the last 12 months: Increasing  General:  Alert, oriented and cooperative. Patient is in no acute distress.  Respiratory: Normal respiratory effort, no problems with respiration noted  Extremities: Normal range of motion.    Mental Status: Normal mood and affect. Normal behavior. Normal judgment and thought content.   DIAGNOSTIC DATA REVIEWED:  BMET    Component Value Date/Time   NA 139 07/16/2017 0515   K 3.8 07/16/2017 0515   CL 104 07/16/2017 0515   CO2 26 07/16/2017 0515   GLUCOSE 124 (H) 07/16/2017 0515   BUN 19 07/16/2017 0515   CREATININE 1.16 07/16/2017 0515   CREATININE 1.16 06/28/2016 1429   CALCIUM 8.5 (L) 07/16/2017 0515   GFRNONAA >60 07/16/2017 0515   GFRAA >60 07/16/2017 0515   Lab Results  Component Value Date   HGBA1C 5.8 (H) 07/08/2017   HGBA1C 5.9 (H) 07/19/2015  No results found for: "INSULIN" CBC    Component Value Date/Time   WBC 17.3 (H) 07/16/2017 0515   RBC 4.58 07/16/2017 0515   HGB 12.4 (L) 07/16/2017 0515   HCT 37.6 (L) 07/16/2017 0515   PLT 159 07/16/2017 0515   MCV 82.1 07/16/2017 0515   MCH 27.1 07/16/2017 0515   MCHC 33.0 07/16/2017 0515   RDW 13.9 07/16/2017 0515   Iron/TIBC/Ferritin/ %Sat No results found for: "IRON", "TIBC", "FERRITIN", "IRONPCTSAT" Lipid Panel     Component Value Date/Time   CHOL 103 02/17/2021 1507   TRIG 112 02/17/2021 1507   HDL 27 (L) 02/17/2021 1507    CHOLHDL 3.8 02/17/2021 1507   CHOLHDL 4.1 12/30/2015 0907   VLDL 17 12/30/2015 0907   LDLCALC 55 02/17/2021 1507   Hepatic Function Panel     Component Value Date/Time   PROT 6.8 12/30/2015 0907   ALBUMIN 3.5 12/30/2015 0907   AST 29 12/30/2015 0907   ALT 32 12/30/2015 0907   ALKPHOS 65 12/30/2015 0907   BILITOT 0.8 12/30/2015 0907   BILIDIR 0.2 12/30/2015 0907   IBILI 0.6 12/30/2015 0907      Component Value Date/Time   TSH 1.072 12/20/2015 0835     Assessment and Plan:  Essential hypertension Continue to follow up with PCP and cardiology.  Continue medications as directed.    Dyslipidemia, goal LDL below 70 Continue to follow up with PCP and cardiology.  Continue medications as directed.   Class 2 severe obesity due to excess calories with serious comorbidity and body mass index (BMI) of 38.0 to 38.9 in adult [facility])       Obesity Treatment / Action Plan:  Patient will work on garnering support from family and friends to begin weight loss journey. Will work on eliminating or reducing the presence of highly palatable, calorie dense foods in the home. Will complete provided nutritional and psychosocial assessment questionnaire before the next appointment. Will be scheduled for indirect calorimetry to determine resting energy expenditure in a fasting state.  This will allow [place] to create a reduced calorie, high-protein meal plan to promote loss of fat mass while preserving muscle mass.  Obesity Education Performed Today:  He was weighed on the bioimpedance scale and results were discussed and documented in the synopsis.  We discussed obesity as a disease and the importance of a more detailed evaluation of all the factors contributing to the disease.  We discussed the importance of long term lifestyle changes which include nutrition, exercise and behavioral modifications as well as the importance of customizing this to his specific health and social needs.  We discussed  the benefits of reaching a healthier weight to alleviate the symptoms of existing conditions and reduce the risks of the biomechanical, metabolic and psychological effects of obesity.  Jae Dire appears to be in the action stage of change and states they are ready to start intensive lifestyle modifications and behavioral modifications.  30+ minutes was spent today on this visit including the above counseling, pre-visit chart review, and post-visit documentation.  Reviewed by clinician on day of visit: allergies, medications, problem list, medical history, surgical history, family history, social history, and previous encounter notes.    I have reviewed the above documentation for accuracy and completeness, and I agree with the above. Everardo Pacific, FNP

## 2023-01-21 ENCOUNTER — Ambulatory Visit (INDEPENDENT_AMBULATORY_CARE_PROVIDER_SITE_OTHER): Payer: Medicare HMO | Admitting: Bariatrics

## 2023-01-21 ENCOUNTER — Encounter: Payer: Self-pay | Admitting: Bariatrics

## 2023-01-21 VITALS — BP 124/71 | HR 62 | Temp 98.0°F | Ht 69.0 in | Wt 259.0 lb

## 2023-01-21 DIAGNOSIS — Z951 Presence of aortocoronary bypass graft: Secondary | ICD-10-CM

## 2023-01-21 DIAGNOSIS — I10 Essential (primary) hypertension: Secondary | ICD-10-CM

## 2023-01-21 DIAGNOSIS — E785 Hyperlipidemia, unspecified: Secondary | ICD-10-CM | POA: Diagnosis not present

## 2023-01-21 DIAGNOSIS — E8881 Metabolic syndrome: Secondary | ICD-10-CM | POA: Diagnosis not present

## 2023-01-21 DIAGNOSIS — Z6837 Body mass index (BMI) 37.0-37.9, adult: Secondary | ICD-10-CM

## 2023-01-21 DIAGNOSIS — Z1331 Encounter for screening for depression: Secondary | ICD-10-CM | POA: Diagnosis not present

## 2023-01-21 DIAGNOSIS — E559 Vitamin D deficiency, unspecified: Secondary | ICD-10-CM | POA: Insufficient documentation

## 2023-01-21 DIAGNOSIS — R0602 Shortness of breath: Secondary | ICD-10-CM

## 2023-01-21 DIAGNOSIS — E669 Obesity, unspecified: Secondary | ICD-10-CM | POA: Insufficient documentation

## 2023-01-21 DIAGNOSIS — Z0289 Encounter for other administrative examinations: Secondary | ICD-10-CM

## 2023-01-21 DIAGNOSIS — Z6838 Body mass index (BMI) 38.0-38.9, adult: Secondary | ICD-10-CM | POA: Insufficient documentation

## 2023-01-21 DIAGNOSIS — R5383 Other fatigue: Secondary | ICD-10-CM | POA: Diagnosis not present

## 2023-01-23 ENCOUNTER — Encounter (INDEPENDENT_AMBULATORY_CARE_PROVIDER_SITE_OTHER): Payer: Self-pay | Admitting: Bariatrics

## 2023-01-23 DIAGNOSIS — E88819 Insulin resistance, unspecified: Secondary | ICD-10-CM | POA: Insufficient documentation

## 2023-01-23 DIAGNOSIS — R7303 Prediabetes: Secondary | ICD-10-CM | POA: Insufficient documentation

## 2023-01-23 LAB — TSH+T4F+T3FREE
Free T4: 0.96 ng/dL (ref 0.82–1.77)
T3, Free: 2.7 pg/mL (ref 2.0–4.4)
TSH: 1.1 u[IU]/mL (ref 0.450–4.500)

## 2023-01-23 LAB — VITAMIN D 25 HYDROXY (VIT D DEFICIENCY, FRACTURES): Vit D, 25-Hydroxy: 18.4 ng/mL — ABNORMAL LOW (ref 30.0–100.0)

## 2023-01-23 LAB — INSULIN, RANDOM: INSULIN: 71.5 u[IU]/mL — ABNORMAL HIGH (ref 2.6–24.9)

## 2023-01-23 LAB — HEMOGLOBIN A1C
Est. average glucose Bld gHb Est-mCnc: 134 mg/dL
Hgb A1c MFr Bld: 6.3 % — ABNORMAL HIGH (ref 4.8–5.6)

## 2023-02-04 ENCOUNTER — Ambulatory Visit (INDEPENDENT_AMBULATORY_CARE_PROVIDER_SITE_OTHER): Payer: Medicare HMO | Admitting: Bariatrics

## 2023-02-04 ENCOUNTER — Encounter: Payer: Self-pay | Admitting: Bariatrics

## 2023-02-04 VITALS — BP 131/73 | HR 64 | Temp 97.8°F | Ht 69.0 in | Wt 259.0 lb

## 2023-02-04 DIAGNOSIS — E559 Vitamin D deficiency, unspecified: Secondary | ICD-10-CM

## 2023-02-04 DIAGNOSIS — Z6838 Body mass index (BMI) 38.0-38.9, adult: Secondary | ICD-10-CM

## 2023-02-04 DIAGNOSIS — R632 Polyphagia: Secondary | ICD-10-CM

## 2023-02-04 DIAGNOSIS — R7303 Prediabetes: Secondary | ICD-10-CM | POA: Diagnosis not present

## 2023-02-04 DIAGNOSIS — I10 Essential (primary) hypertension: Secondary | ICD-10-CM

## 2023-02-04 DIAGNOSIS — E669 Obesity, unspecified: Secondary | ICD-10-CM

## 2023-02-04 MED ORDER — VITAMIN D (ERGOCALCIFEROL) 1.25 MG (50000 UNIT) PO CAPS: 50000.0000 [IU] | ORAL_CAPSULE | ORAL | 0 refills | Status: AC

## 2023-02-06 NOTE — Progress Notes (Unsigned)
Chief Complaint:   OBESITY Luis Tucker Tucker (MR# [id number]) is a 72 y.o. male who presents for evaluation and treatment of obesity and related comorbidities. Current BMI is Body mass index is 38.25 kg/m. Luis Tucker Tucker has been struggling with his weight for many years and has been unsuccessful in either losing weight, maintaining weight loss, or reaching his healthy weight goal.  Luis Tucker Tucker met with Luis Tucker Tucker on 01/09/2023. He states that he craves sweets. He wants to lose 50 lbs.   Luis Tucker Tucker is currently in the action stage of change and ready to dedicate time achieving and maintaining a healthier weight. Luis Tucker Tucker is interested in becoming our patient and working on intensive lifestyle modifications including (but not limited to) diet and exercise for weight loss.  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  Depression Screen Luis Tucker Tucker's Food and Mood (modified PHQ-9) score was 15.  Subjective:   1. Other fatigue Luis Tucker Tucker admits to daytime somnolence and admits to waking up still tired. Patient has a history of symptoms of daytime fatigue and morning fatigue. Luis Tucker Tucker generally gets 8 or 9 hours of sleep per night, and states that he has nightime awakenings. Snoring is present. Apneic episodes are not present. Epworth Sleepiness Score is 4.   2. SOB (shortness of breath) on exertion Luis Tucker Tucker notes increasing shortness of breath with exercising and seems to be worsening over time with weight gain. He notes getting out of breath sooner with activity than he used to. This has not gotten worse recently. Luis Tucker Tucker denies shortness of breath at rest or orthopnea.  3. Dyslipidemia, goal LDL below 70 Luis Tucker Tucker is taking  atorvastatin.  His last HDL was low, controlled.  4. S/P CABG x 2 Luis Tucker Tucker is taking carvedilol and losartan.  He has CAD.  5. Metabolic syndrome Luis Tucker Tucker is not on specific medications.  6. Essential hypertension Luis Tucker Tucker is taking Dyazide, losartan, and Coreg.  7. Vitamin D deficiency Luis Tucker Tucker is not taking vitamin D currently.  Assessment/Plan:   1. Other fatigue Luis Tucker Tucker does feel that his weight is causing his energy to be lower than it should be. Fatigue may be related to obesity, depression or many other causes. Labs will be ordered, and in the meanwhile, Luis Tucker Tucker will focus on self care including making healthy food choices, increasing physical activity and focusing on stress reduction.  - Hemoglobin A1c - Insulin, random - TSH+T4F+T3Free  2. SOB (shortness of breath) on exertion Luis Tucker Tucker does feel that he gets out of breath more easily that he used to when he exercises. Luis Tucker Tucker's shortness of breath appears to be obesity related and exercise induced. He has agreed to work on weight loss and gradually increase exercise to treat his exercise induced shortness of breath. Will continue to monitor closely.  - Hemoglobin A1c - Insulin, random - TSH+T4F+T3Free  3. Dyslipidemia, goal LDL below 70 Luis Tucker Tucker will continue atorvastatin as directed.   4. S/P CABG x 2 Luis Tucker Tucker will continue to follow-up with his PCP and follow-up with his cardiologist once a year.  5. Metabolic syndrome We will check labs today.  Luis Tucker Tucker will continue to work on his weight loss and exercise.  - Hemoglobin A1c - Insulin, random  6. Essential  hypertension Luis Tucker Tucker will continue his medications as directed.  7. Vitamin D deficiency We will check labs today, and we will follow-up at Luis Tucker Tucker's next visit.  - VITAMIN D 25 Hydroxy (Vit-D Deficiency, Fractures)  8. Depression screening Luis Tucker Tucker had a positive depression screening. Depression is commonly associated with obesity and often results in emotional eating behaviors. We will monitor this  closely and work on CBT to help improve the non-hunger eating patterns. Referral to Psychology may be required if no improvement is seen as he continues in our clinic.  9. Generalized obesity  10. BMI 37.0-37.9, adult Luis Tucker Tucker is currently in the action stage of change and his goal is to continue with weight loss efforts. I recommend Luis Tucker Tucker begin the structured treatment plan as follows:  He has agreed to the Category 3 Plan.  Patient will begin his diet and exercise. Reviewed labs from 11/12/2022, prostate, CMP, CBC, and lipid.   Exercise goals: Consider walking on the treadmill.    Behavioral modification strategies: increasing lean protein intake, decreasing simple carbohydrates, increasing vegetables, increasing water intake, decreasing eating out, no skipping meals, meal planning and cooking strategies, keeping healthy foods in the home, and planning for success.  He was informed of the importance of frequent follow-up visits to maximize his success with intensive lifestyle modifications for his multiple health conditions. He was informed we would discuss his lab results at his next visit unless there is a critical issue that needs to be addressed sooner. Luis Tucker Tucker agreed to keep his next visit at the agreed upon time to discuss these results.  Objective:   Blood pressure 124/71, pulse 62, temperature 98 F (36.7 C), height 5' 9"$  (1.753 m), weight 259 lb (117.5 kg), SpO2 99 %. Body mass index is 38.25 kg/m.  EKG: Normal sinus rhythm, rate 74 BPM.  Indirect Calorimeter completed today shows a VO2 of 293 and a REE of 2016.  His calculated basal metabolic rate is [id number] thus his basal metabolic rate is worse than expected.  General: Cooperative, alert, well developed, in no acute distress. HEENT: Conjunctivae and lids unremarkable. Cardiovascular: Regular rhythm.  Lungs: Normal work of breathing. Neurologic: No focal deficits.   Lab Results  Component Value Date   CREATININE 1.16 07/16/2017    BUN 19 07/16/2017   NA 139 07/16/2017   K 3.8 07/16/2017   CL 104 07/16/2017   CO2 26 07/16/2017   Lab Results  Component Value Date   ALT 32 12/30/2015   AST 29 12/30/2015   ALKPHOS 65 12/30/2015   BILITOT 0.8 12/30/2015   Lab Results  Component Value Date   HGBA1C 6.3 (H) 01/21/2023   HGBA1C 5.8 (H) 07/08/2017   HGBA1C 5.9 (H) 12/30/2015   HGBA1C 5.9 (H) 07/19/2015   Lab Results  Component Value Date   INSULIN 71.5 (H) 01/21/2023   Lab Results  Component Value Date   TSH 1.100 01/21/2023   Lab Results  Component Value Date   CHOL 103 02/17/2021   HDL 27 (L) 02/17/2021   LDLCALC 55 02/17/2021   TRIG 112 02/17/2021   CHOLHDL 3.8 02/17/2021   Lab Results  Component Value Date   WBC 17.3 (H) 07/16/2017   HGB 12.4 (L) 07/16/2017   HCT 37.6 (L) 07/16/2017   MCV 82.1 07/16/2017   PLT 159 07/16/2017   No results found for: "IRON", "TIBC", "FERRITIN"  Attestation Statements:   Reviewed by clinician on day of visit: allergies, medications, problem list, medical history, surgical history, family history, social history, and  previous encounter notes.   Wilhemena Durie, am acting as Location manager for [employer], DO.  I have reviewed the above documentation for accuracy and completeness, and I agree with the above. Jearld Lesch, DO

## 2023-02-14 ENCOUNTER — Encounter: Payer: Self-pay | Admitting: Nurse Practitioner

## 2023-02-14 ENCOUNTER — Ambulatory Visit (INDEPENDENT_AMBULATORY_CARE_PROVIDER_SITE_OTHER): Payer: Medicare HMO | Admitting: Nurse Practitioner

## 2023-02-14 VITALS — BP 138/75 | HR 63 | Temp 97.8°F | Ht 69.0 in | Wt 258.0 lb

## 2023-02-14 DIAGNOSIS — E559 Vitamin D deficiency, unspecified: Secondary | ICD-10-CM | POA: Diagnosis not present

## 2023-02-14 DIAGNOSIS — R7303 Prediabetes: Secondary | ICD-10-CM

## 2023-02-14 DIAGNOSIS — Z6838 Body mass index (BMI) 38.0-38.9, adult: Secondary | ICD-10-CM

## 2023-02-14 NOTE — Progress Notes (Signed)
Chief Complaint:   OBESITY Luis Tucker is here to discuss his progress with his obesity treatment plan along with follow-up of his obesity related diagnoses. Theartis is on the Category 3 plan and states he is following his eating plan approximately 60-70% of the time. Najm states he has not been exercising.  Today's visit was #: 2 Starting weight: 259 lbs Starting date: 01/21/23 Today's weight: 259 lbs Today's date: 02/04/23 Total lbs lost to date: 0 Total lbs lost since last in-office visit: 0  Interim History: His weight remains the same.  He struggled to eat right.  He is eating 3 meals a day.  He is down 1.2% body fat per the bioimpedance.  Subjective:   1. Essential hypertension Controlled.  2. Polyphagia Has some cravings.  Has a sweet tooth. History of kidney stones.   3. Vitamin D deficiency Vitamin D 18.4.  4. Prediabetes Last A1c was 6.3  Insulin was 71.5. Assessment/Plan:   1. Essential hypertension 1.  Will start on the treadmill (slowly and progress).  2. Polyphagia 1.  Will not keep (certain desserts) at home. 2.  Will keep healthy fruits at home. 3.  Will consider medication for cravings in the future.  3. Vitamin D deficiency New prescription-vitamin D 50,000 IU weekly, #5, no refills.  4. Prediabetes 1.  Handout for prediabetes/insulin resistance.  5. Generalized obesity, BMI 38.0-38.9,adult 1.  Meal planning 2.  Intentional eating 3.  Will continue to increase his water. 4.  Reviewed labs 01/21/2023: Vitamin D, A1c, insulin, and thyroid panel.  Zaxton is currently in the action stage of change. As such, his goal is to continue with weight loss efforts. He has agreed to the Category 3 plan.  Exercise goals: Will get on the treadmill and will buy a brace.  Behavioral modification strategies: increasing lean protein intake, decreasing simple carbohydrates, increasing vegetables, increasing water intake, decreasing eating out, no skipping meals, meal  planning and cooking strategies, keeping healthy foods in the home, and planning for success.  Raife has agreed to follow-up with our clinic in 2 weeks. He was informed of the importance of frequent follow-up visits to maximize his success with intensive lifestyle modifications for his multiple health conditions.    Objective:   Blood pressure 131/73, pulse 64, temperature 97.8 F (36.6 C), height '5\' 9"'$  (1.753 m), weight 259 lb (117.5 kg), SpO2 98 %. Body mass index is 38.25 kg/m.  General: Cooperative, alert, well developed, in no acute distress. HEENT: Conjunctivae and lids unremarkable. Cardiovascular: Regular rhythm.  Lungs: Normal work of breathing. Neurologic: No focal deficits.   Lab Results  Component Value Date   CREATININE 1.16 07/16/2017   BUN 19 07/16/2017   NA 139 07/16/2017   K 3.8 07/16/2017   CL 104 07/16/2017   CO2 26 07/16/2017   Lab Results  Component Value Date   ALT 32 12/30/2015   AST 29 12/30/2015   ALKPHOS 65 12/30/2015   BILITOT 0.8 12/30/2015   Lab Results  Component Value Date   HGBA1C 6.3 (H) 01/21/2023   HGBA1C 5.8 (H) 07/08/2017   HGBA1C 5.9 (H) 12/30/2015   HGBA1C 5.9 (H) 07/19/2015   Lab Results  Component Value Date   INSULIN 71.5 (H) 01/21/2023   Lab Results  Component Value Date   TSH 1.100 01/21/2023   Lab Results  Component Value Date   CHOL 103 02/17/2021   HDL 27 (L) 02/17/2021   LDLCALC 55 02/17/2021   TRIG 112 02/17/2021  CHOLHDL 3.8 02/17/2021   Lab Results  Component Value Date   VD25OH 18.4 (L) 01/21/2023   Lab Results  Component Value Date   WBC 17.3 (H) 07/16/2017   HGB 12.4 (L) 07/16/2017   HCT 37.6 (L) 07/16/2017   MCV 82.1 07/16/2017   PLT 159 07/16/2017   No results found for: "IRON", "TIBC", "FERRITIN"  Attestation Statements:   Reviewed by clinician on day of visit: allergies, medications, problem list, medical history, surgical history, family history, social history, and previous encounter  notes.  I, Dawn Whitmire, FNP-C, am acting as transcriptionist for Dr. Jearld Lesch.  I have reviewed the above documentation for accuracy and completeness, and I agree with the above. Jearld Lesch, DO

## 2023-02-14 NOTE — Progress Notes (Signed)
Office: [phone]  /  Fax: [phone]  WEIGHT SUMMARY AND BIOMETRICS  Weight Lost Since Last Visit: 1lb   Medical Weight Loss Height: 5' 9"$  (1.753 m) Weight: 258 lb (117 kg) Temp: 97.8 F (36.6 C) Tucker Rate: 63 BP: 138/75 SpO2: 98 % Fasting: No Today's Visit #: 3 Weight at Last VIsit: 259lb Weight Lost Since Last Visit: 1lb  Body Fat %: 33 % Fat Mass (lbs): 85.2 lbs Muscle Mass (lbs): 164.8 lbs Total Body Water (lbs): 113.8 lbs Visceral Fat Rating : 22 Starting Date: 01/14/23 Starting Weight: 259lb Total Weight Loss (lbs): 1 lb (0.454 kg)     HPI  Chief Complaint: OBESITY  Luis Tucker is here to discuss his progress with his obesity treatment plan. He is on the practicing portion control and making smarter food choices, such as increasing vegetables and decreasing simple carbohydrates and states he is following his eating plan approximately varies % of the time. He states he is exercising 90 minutes 2-3 days per week.   Interval History:  Since last office visit he has lost 1 pound.  He is skipping meals. He is not eating enough calories daily,  He drank 2 protein shakes yesterday and ate chicken at dinner.  Today he's only eaten 2 strips of bacon.  He tries to avoid junk food.  He is drinking water and OJ daily. Denies sugary drinks.       Pharmacotherapy for weight loss: He is not currently taking medications  for medical weight loss.    Previous pharmacotherapy for medical weight loss:   none      Prediabetes Last A1c was 6.3  Medication(s): never been on meds  FH: none  Lab Results  Component Value Date   HGBA1C 6.3 (H) 01/21/2023   HGBA1C 5.8 (H) 07/08/2017   HGBA1C 5.9 (H) 12/30/2015   HGBA1C 5.9 (H) 07/19/2015   Lab Results  Component Value Date   INSULIN 71.5 (H) 01/21/2023   Vit D deficiency  He is taking Vit D 50,000 IU weekly.  Denies side effects.  Denies nausea, vomiting or muscle weakness.    Lab Results  Component Value Date   VD25OH  18.4 (L) 01/21/2023    PHYSICAL EXAM:  Blood pressure 138/75, Tucker 63, temperature 97.8 F (36.6 C), height 5' 9"$  (1.753 m), weight 258 lb (117 kg), SpO2 98 %. Body mass index is 38.1 kg/m.  General: He is overweight, cooperative, alert, well developed, and in no acute distress. PSYCH: Has normal mood, affect and thought process.   Extremities: No edema.  Neurologic: No gross sensory or motor deficits. No tremors or fasciculations noted.    DIAGNOSTIC DATA REVIEWED:  BMET    Component Value Date/Time   NA 139 07/16/2017 0515   K 3.8 07/16/2017 0515   CL 104 07/16/2017 0515   CO2 26 07/16/2017 0515   GLUCOSE 124 (H) 07/16/2017 0515   BUN 19 07/16/2017 0515   CREATININE 1.16 07/16/2017 0515   CREATININE 1.16 06/28/2016 1429   CALCIUM 8.5 (L) 07/16/2017 0515   GFRNONAA >60 07/16/2017 0515   GFRAA >60 07/16/2017 0515   Lab Results  Component Value Date   HGBA1C 6.3 (H) 01/21/2023   HGBA1C 5.9 (H) 07/19/2015   Lab Results  Component Value Date   INSULIN 71.5 (H) 01/21/2023   Lab Results  Component Value Date   TSH 1.100 01/21/2023   CBC    Component Value Date/Time   WBC 17.3 (H) 07/16/2017 0515   RBC 4.58  07/16/2017 0515   HGB 12.4 (L) 07/16/2017 0515   HCT 37.6 (L) 07/16/2017 0515   PLT 159 07/16/2017 0515   MCV 82.1 07/16/2017 0515   MCH 27.1 07/16/2017 0515   MCHC 33.0 07/16/2017 0515   RDW 13.9 07/16/2017 0515   Iron Studies No results found for: "IRON", "TIBC", "FERRITIN", "IRONPCTSAT" Lipid Panel     Component Value Date/Time   CHOL 103 02/17/2021 1507   TRIG 112 02/17/2021 1507   HDL 27 (L) 02/17/2021 1507   CHOLHDL 3.8 02/17/2021 1507   CHOLHDL 4.1 12/30/2015 0907   VLDL 17 12/30/2015 0907   LDLCALC 55 02/17/2021 1507   Hepatic Function Panel     Component Value Date/Time   PROT 6.8 12/30/2015 0907   ALBUMIN 3.5 12/30/2015 0907   AST 29 12/30/2015 0907   ALT 32 12/30/2015 0907   ALKPHOS 65 12/30/2015 0907   BILITOT 0.8 12/30/2015  0907   BILIDIR 0.2 12/30/2015 0907   IBILI 0.6 12/30/2015 0907      Component Value Date/Time   TSH 1.100 01/21/2023 1132   Nutritional Lab Results  Component Value Date   VD25OH 18.4 (L) 01/21/2023     ASSESSMENT AND PLAN  TREATMENT PLAN FOR OBESITY:  Recommended Dietary Goals   Today we discussed extensively about keeping a food journal and adhering to recommended goals of 1500 calories and 100+ protein.  I am extremely concerned about Luis Tucker.  He is not eating enough calories and is not meeting his protein goals.  I talked with him extensively about malnourishment and I am concerned about him. He is not eating enough calories.  I have recommended that he eat at least 1500 cals a day (his RMR was 2016) and aim for 100 g of protein per day.  He is interested in starting a weight loss medication, but as I have discussed with him, he is not eating enough calories and it is not appropriate to put him on a weight loss medication at this time.  We discussed extensively safety today(it is not safe to put him on a weight loss medication since he is eating <1000 calories per day), malnourishment and doing what is right/safe for the patient.  I am concerned about him because of his CAD, hypertension, dyslipidemia and pre diabetes.  His recent A1c was 6.3 and his fasting insulin was 71.5. I've asked him to follow up closely with his PCP to monitor his A1c, HLD, pre diabetes, Vit D def and fasting insulin as he's at risk for MI and/or CVA.  I offered to work with him closely and follow all of the above closely but he states that he doesn't want to come back to the office since he's interested in starting a weight loss medication.  I don't feel a weight loss medication is appropriate for him due to his low calorie intake.  He's not a candidate for Phentermine or Qsymia due to his CAD(CABG X 2), HTN, unstable angina.  I would use Contave with caution due to the above. Would recommend clearance from  cardiology prior to starting.  Unfortunately his insurance doesn't cover [place], [place] or Zepbound.  If his A1c > over 6.5 then he has options of Victoza, Ozempic, Trulicity, Mounjaro, etc based upon coverage.  However, due to his limited calories intake, I would be concerned about starting a GLP-1.  I've enjoyed talking with Luis Tucker and told him that I can offer nutrition recommendations to help with his pre diabetes, insulin resistance  and weight loss but he is not interested at this time.  He's welcome to come back for follow up anytime.     Behavioral Intervention  We discussed the following Behavioral Modification Strategies today: increasing lean protein intake, decreasing simple carbohydrates , increasing vegetables, avoid skipping meals, increase water intake, work on meal planning and easy cooking plans, and think about ways to increase physical activity.  Additional resources provided today: NA  Recommended Physical Activity Goals  Luis Tucker has been advised to work up to 150 minutes of moderate intensity aerobic activity a week and strengthening exercises 2-3 times per week for cardiovascular health, weight loss maintenance and preservation of muscle mass.   He has agreed to Will continue to do regular resistance exercise 3 or more days per week.  and Patient also encouraged on scheduling and tracking physical activity.    ASSOCIATED CONDITIONS ADDRESSED TODAY  Action/Plan  Vitamin D deficiency  Prediabetes  Morbid obesity ([place])  BMI 38.0-38.9,adult      See note above.    Return if symptoms worsen or fail to improve.Marland Kitchen He was informed of the importance of frequent follow up visits to maximize his success with intensive lifestyle modifications for his multiple health conditions.   ATTESTASTION STATEMENTS:  Reviewed by clinician on day of visit: allergies, medications, problem list, medical history, surgical history, family history, social history, and previous encounter  notes.   Time spent on visit including pre-visit chart review and post-visit care and charting was 30 minutes.    Ailene Rud. Nur Krasinski FNP-C

## 2023-02-14 NOTE — Patient Instructions (Signed)

## 2023-03-23 ENCOUNTER — Other Ambulatory Visit: Payer: Self-pay | Admitting: Bariatrics

## 2023-12-10 ENCOUNTER — Other Ambulatory Visit: Payer: Self-pay

## 2023-12-10 MED ORDER — NITROGLYCERIN 0.4 MG SL SUBL: 0.4000 mg | SUBLINGUAL_TABLET | SUBLINGUAL | 1 refills | Status: DC | PRN

## 2024-02-18 ENCOUNTER — Ambulatory Visit (HOSPITAL_BASED_OUTPATIENT_CLINIC_OR_DEPARTMENT_OTHER): Payer: Medicare HMO | Admitting: Internal Medicine

## 2024-02-18 VITALS — BP 142/80 | HR 51 | Ht 71.0 in | Wt 260.0 lb

## 2024-02-18 DIAGNOSIS — Z951 Presence of aortocoronary bypass graft: Secondary | ICD-10-CM

## 2024-02-18 DIAGNOSIS — I10 Essential (primary) hypertension: Secondary | ICD-10-CM | POA: Diagnosis not present

## 2024-02-18 DIAGNOSIS — E66812 Obesity, class 2: Secondary | ICD-10-CM

## 2024-02-18 DIAGNOSIS — N528 Other male erectile dysfunction: Secondary | ICD-10-CM

## 2024-02-18 DIAGNOSIS — I25810 Atherosclerosis of coronary artery bypass graft(s) without angina pectoris: Secondary | ICD-10-CM | POA: Diagnosis not present

## 2024-02-18 DIAGNOSIS — Z6836 Body mass index (BMI) 36.0-36.9, adult: Secondary | ICD-10-CM

## 2024-02-18 DIAGNOSIS — E785 Hyperlipidemia, unspecified: Secondary | ICD-10-CM

## 2024-02-18 MED ORDER — CARVEDILOL 12.5 MG PO TABS: 12.5000 mg | ORAL_TABLET | Freq: Two times a day (BID) | ORAL | 3 refills | Status: AC

## 2024-02-18 MED ORDER — TADALAFIL 10 MG PO TABS: ORAL_TABLET | ORAL | 1 refills | Status: DC

## 2024-02-18 MED ORDER — NITROGLYCERIN 0.4 MG SL SUBL: 0.4000 mg | SUBLINGUAL_TABLET | SUBLINGUAL | 1 refills | Status: AC | PRN

## 2024-02-18 MED ORDER — TRIAMTERENE-HCTZ 37.5-25 MG PO CAPS: 1.0000 | ORAL_CAPSULE | Freq: Every day | ORAL | 3 refills | Status: AC

## 2024-02-18 MED ORDER — FAMOTIDINE 20 MG PO TABS: 20.0000 mg | ORAL_TABLET | Freq: Two times a day (BID) | ORAL | 3 refills | Status: DC | PRN

## 2024-02-18 MED ORDER — ATORVASTATIN CALCIUM 80 MG PO TABS: 80.0000 mg | ORAL_TABLET | Freq: Every day | ORAL | 3 refills | Status: AC

## 2024-02-18 MED ORDER — LOSARTAN POTASSIUM 25 MG PO TABS: 25.0000 mg | ORAL_TABLET | Freq: Every day | ORAL | 3 refills | Status: DC

## 2024-02-18 NOTE — Progress Notes (Signed)
 OFFICE NOTE  Chief Complaint:  Routine follow-up  Primary Care Physician: April Manson, NP  HPI:  Luis Tucker is a pleasant 73 year old male with a history of coronary artery disease. He had a stent to the circumflex in 2008. 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 He also has hypertension dyslipidemia and metabolic syndrome. Is not very physically active as been unable to lose weight recently. 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 He denies any chest pain worsening shortness of breath over the past year and recently had laboratory work provided by Dr. Kathrynn Running at [facility] care which showed a well-controlled lipid profile.  He presented last week to [facility] with worsening chest pain concerning for angina.   His EKG showed SR, J point elevation and inferolateral T waves different from previous ECGs. He was admitted for further evaluation and placed on IV heparin. His home triamterene/HCTZ and losartan were held on admission due to Cr 1.29. Upon further review it appears prior Cr in 2011 was 1.46 so he likely has CKD stage III. Troponins remained negative. LDL 69. Had hypokalemia which required repletion (likely r/t prior HCTZ use). He underwent cardiac cath showing severe ostial LCx disease s/p angioplasty/Synergy stent placement with moderate prox LAD stenosis. LAD disease did not appear to be different than recent path. Patent mid LCx stent. Normal EF and LVEDP. DAPT for at least 1 year recommended - started on Effient.    Luis Tucker returns for follow-up. He was recently seen by Norma Fredrickson, NP, who felt that he was doing well. No medication changes were made. He subsequently has seen his primary care provider and was advised that he needed at DOT physical. He denies any further angina or shortness of breath.  Luis Tucker is seen today in hospital follow-up. He reports feeling fairly well although noted  his heart rate is been elevated. We went through his extensive medication list and it seems like he is taking several medicines that he probably should not be on. He was taking aspirin 325 and 81 mg as well as Effient. He's also taking HCTZ and Dyazide. In addition he still on indoor which is likely not necessary post bypass. He reports some soreness to his chest wall and numbness but he denies any cardiac chest pain. He does do some walking and gets some mild shortness of breath. EKG shows sinus tachycardia 105.  Luis Tucker returns today for follow-up. He was last seen 3 months ago. We had to correct some medication irregularities. He since has been taking his medications as we prescribed. He denies any chest pain or worsening shortness of breath. He is unfortunately gained some weight due to inactivity. He is very interested in going back to work. His surgeon Dr. Cornelius Moras feels that it's okay for him to go back from a surgical standpoint as of tomorrow, 04/03/2016. His DOT evaluator is recommending either stress test or echocardiography and as his perioperative TEE indicated an EF of 50%, I would like to repeat echocardiogram to see if LV function has improved. From a clinical standpoint he's doing very well.  09/28/2016  Luis Tucker is seen today in follow-up. Overall he is doing well however he has had weight gain since his last office visit. He was seen in the interim for evaluation of some hand swelling and his Dyazide was stopped and he was put on Lasix. He says  that Lasix is intolerable doesn't seem to help his swelling much. He wants to go back to Dyazide. He's also reported some problems with erectile dysfunction. He is interested in Levitra. He's taken it previously with some benefit. He denies any chest pain or worsening shortness of breath.  05/10/2017  Luis Tucker is seen today in follow-up. He is contemplating left knee surgery. He denies any new chest pain or worsening shortness of breath however has had  increasing weight gain. He is less active than he has been in the past but still continues to work as a Naval architect. He requires DOT stress testing every other year. He underwent recent LIMA to LAD and free radial graft to the OM 2 and January 2017. He seems to be doing well with this. An EKG in the office today however shows new inferior and lateral T-wave inversions. Etiology of this is unclear.   11/14/2018  Luis Tucker is seen today in routine follow-up.  He did undergo left knee surgery last year.  He said his ED where she would have done it sooner.  Unfortunately he continued to gain some weight.  Recently he is lost 20 pounds but overall he is up quite a bit.  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  He denies any chest pain.  He has not had a recent repeat lipid profile.  He is still having issues with ED and is requesting Levitra today.  11/12/2019  Luis Tucker is seen for annual follow-up.  He recently underwent stress testing which was negative for ischemia however showed an LVEF of 48%.  This could have been artifactual or might be related to some degree of uncontrolled hypertension.  Blood pressure initially today was 141/97 however recheck came down to 124/80.  Weight has stayed elevated although he exercises regularly.  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  He wants to maintain his DOT just in case he would consider working again.  He has no chest pain or worsening shortness of breath.  He continues to have some issues with erectile dysfunction and was on Levitra 10mg  as needed which she says has not been effective.  03/07/2021  Luis Tucker returns today for follow-up.  Overall he says he is feeling well except he gained weight recently.  He stopped working 2 years ago and has been having some trouble with his left knee.  He denies any chest pain or shortness of breath.   Showed total cholesterol 103, triglycerides 112 HDL 27 and LDL 55.  He still reports using Levitra but says that sometimes is not as effective for him.  He is also is being seen at the [facility] hospital.  He said he would be interested in weight management if he cannot get the weight down on his own.  10/24/2021  Luis Tucker seen today in follow-up.  He seems to be doing well from a cardiac standpoint.  He denies any chest pain or worsening shortness of breath.  He continues to get care through the [facility] hospital in [place].  While he says he has had some recent lipids.  The last numbers I saw were in February 2022 showing total cholesterol 103, triglycerides 112, HDL 27 and LDL 55.  This represents excellent control.  Blood pressure is good today at 136/80.  Weight is still more than ideal.  He  reports little benefit from Levitra.  He still has some issues with erectile dysfunction and is wondering what other options he may have.  01/01/2023  Luis Tucker is seen today in follow-up.  Overall it seems he is doing pretty well.  He has had no chest pain or worsening shortness of breath.  He is now 7 years out of his bypass surgery.  EKG today again is abnormal but not changed since his prior EKG.  Blood pressure is well-controlled.  His last lipid profile showed total cholesterol 113, triglycerides 93, HDL 29 and LDL 66.  His main concern today is his weight.  He says this has been fluctuating and he really wants to try to lose more weight.  He struggled with this and feels that he would benefit from a structured weight loss program.  02/18/2024  Luis Tucker is seen today for routine follow-up.  Over the past year he has done fairly well although weight continues to be an issue.  He recently established with [facility] weight loss clinic and had 4 sessions however he was disappointed because it was just counseling and he was not prescribed any weight loss medications.  He says he was told that he would not qualify for them.  He  does have a history however of cardiovascular disease and obesity therefore should qualify for [facility] based on newer guidelines.  He has not had lipids tested since 2023 but at that time his total cholesterol was 113, triglycerides 93, HDL 29 and LDL 66.  He continues to have issues with erectile dysfunction and inquired about renewal of his Cialis.  He also needs renewal of his nitroglycerin but has never used it.  He denies any chest pain or worsening shortness of breath.  He does get some physical activity including walking and treadmill work.  PMHx:  Past Medical History:  Diagnosis Date   Arthritis    "little in my knees" (12/29/2015)   Back pain    CAD (coronary artery disease)    a. h/o stent to Cx 2008. b. cath 03/2009 demonstrated patent stent to circumflex, 60% LAD stenosis. c. [place] 06/2015: severe ostial LCx disease s/p angioplasty/Synergy stent placement. Moderate prox LAD disease, unchanged from prior, patent LCx stent, normal EF.   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   LIMA to LAD, LRA to OM2    Past Surgical History:  Procedure Laterality Date   CARDIAC CATHETERIZATION  2010   [place], LAD 60%, CFX 60-70%, stent OK, RCA OK, EF 60%   CARDIAC CATHETERIZATION N/A 07/20/2015   Procedure: Left Heart Cath and Coronary Angiography;  Surgeon: Iran Ouch, MD;  Location: [facility] INVASIVE CV LAB;  Service: Cardiovascular;  Laterality: N/A;   CARDIAC CATHETERIZATION N/A  07/20/2015   Procedure: Coronary Stent Intervention;  Surgeon: Iran Ouch, MD;  Location: [facility] INVASIVE CV LAB;  Service: Cardiovascular;  Laterality:  N/A;   CARDIAC CATHETERIZATION N/A 12/29/2015   Procedure: Left Heart Cath and Coronary Angiography;  Surgeon: Runell Gess, MD;  Location: [facility] INVASIVE CV LAB;  Service: Cardiovascular;  Laterality: N/A;   CORONARY ANGIOPLASTY WITH STENT PLACEMENT  03/2007   4.0 x 15-mm Vision BMS mCFX; 60% residual LAD   CORONARY ARTERY BYPASS GRAFT N/A 01/04/2016   Procedure: CORONARY ARTERY BYPASS GRAFTING (CABG) x two using left internal mammary artery and left radial artery ;  Surgeon: Purcell Nails, MD;  Location: [facility] OR;  Service: Open Heart Surgery;  Laterality: N/A;   KNEE ARTHROSCOPY Left 2004; 2011   LYMPH NODE DISSECTION Right 1973   "groin"   RADIAL ARTERY HARVEST Left 01/04/2016   Procedure: RADIAL ARTERY HARVEST;  Surgeon: Purcell Nails, MD;  Location: [facility] OR;  Service: Open Heart Surgery;  Laterality: Left;   TEE WITHOUT CARDIOVERSION N/A 01/04/2016   Procedure: TRANSESOPHAGEAL ECHOCARDIOGRAM (TEE);  Surgeon: Purcell Nails, MD;  Location[facility] OR;  Service: Open Heart Surgery;  Laterality: N/A;   TOTAL KNEE ARTHROPLASTY Left 07/15/2017   Procedure: LEFT TOTAL KNEE ARTHROPLASTY;  Surgeon: Durene Romans, MD;  Location: [facility] ORS;  Service: Orthopedics;  Laterality: Left;  90 mins    TRANSTHORACIC ECHOCARDIOGRAM  04/2007   EF >55%; mild concentric LVH; borderline RV enlargement; RA & LA mildly dilated; mild MR, mild TR, mild PV regurg    FAMHx:  Family History  Adopted: Yes  Problem Relation Age of Onset   Cancer - Prostate Father     SOCHx:   reports that he quit smoking about 16 years ago. His smoking use included cigarettes. He started smoking about 51 years ago. He has a 35 pack-year smoking history. He has never used smokeless tobacco. He reports current alcohol use of about 1.0 standard drink of alcohol per week. He reports current drug  use. Drug: Marijuana.  ALLERGIES:  No Known Allergies  ROS: Pertinent items noted in HPI and remainder of comprehensive ROS otherwise negative.  HOME MEDS: Current Outpatient Medications  Medication Sig Dispense Refill   aspirin EC 81 MG tablet Take 81 mg by mouth daily. Swallow whole.     atorvastatin (LIPITOR) 80 MG tablet Take 1 tablet (80 mg total) by mouth daily. 90 tablet 3   carvedilol (COREG) 12.5 MG tablet TAKE 1 TABLET (12.5 MG TOTAL) BY MOUTH 2 (TWO) TIMES DAILY WITH A MEAL. 180 tablet 3   losartan (COZAAR) 25 MG tablet TAKE 1 TABLET (25 MG TOTAL) BY MOUTH DAILY. 90 tablet 3   nitroGLYCERIN (NITROSTAT) 0.4 MG SL tablet Place 1 tablet (0.4 mg total) under the tongue every 5 (five) minutes as needed for chest pain. 25 tablet 1   pantoprazole (PROTONIX) 40 MG tablet Take 1 tablet (40 mg total) by mouth daily as needed (acid reflux). 90 tablet 3   tadalafil (CIALIS) 10 MG tablet Take 10 mg 30 minutes to 1 hour before sexual relations 10 tablet 1   triamterene-hydrochlorothiazide (DYAZIDE) 37.5-25 MG capsule TAKE 1 CAPSULE BY MOUTH EVERY DAY 90 capsule 0   furosemide (LASIX) 20 MG tablet TAKE 1 TABLET BY MOUTH DAILY. MAY TAKE EXTRA TABLET DAILY AS NEEDED FOR SWELLING. (Patient not taking: Reported on 02/18/2024) 60 tablet 5   Vitamin D, Ergocalciferol, (DRISDOL) 1.25 MG (50000 UNIT) CAPS capsule Take 1 capsule (50,000 Units total) by mouth every 7 (seven) days. (Patient not taking: Reported on 02/18/2024) 5 capsule 0   No current facility-administered medications for this visit.  LABS/IMAGING: No results found for this or any previous visit (from the past 48 hours). No results found.  VITALS: There were no vitals taken for this visit.  EXAM: General appearance: alert, no distress and mildly obese Neck: no carotid bruit and no JVD Lungs: clear to auscultation bilaterally Heart: regular rate and rhythm, S1, S2 normal, no murmur, click, rub or gallop and no  Abdomen: soft,  non-tender; bowel sounds normal; no masses,  no organomegaly Extremities: extremities normal, atraumatic, no cyanosis or edema and . Pulses: 2+ and symmetric Skin: Skin color, texture, turgor normal. No rashes or lesions Neurologic: Grossly normal  EKG: EKG Interpretation Date/Time:  Tuesday February 18 2024 08:11:08 EST Ventricular Rate:  51 PR Interval:  180 QRS Duration:  142 QT Interval:  462 QTC Calculation: 425 R Axis:   -20  Text Interpretation: Sinus bradycardia Left ventricular hypertrophy with QRS widening ( R in aVL , Cornell product ) Nonspecific T wave abnormality When compared with ECG of 05-Jan-2016 06:49, Vent. rate has decreased BY  37 BPM QRS duration has increased ST no longer elevated in Lateral leads Confirmed by Zoila Shutter [phone]) on 02/18/2024 8:15:39 AM    ASSESSMENT: Abnormal EKG with inferior lateral T-wave changes - improved Coronary artery disease status post PCI to the circumflex in 2008 - and PCI to the ostial left circumflex with a synergy DES (2016) ISR with CABG x 2 with LIMA to LAD and free radial graft to OM2 (12/2015) Metabolic syndrome Moderate obesity Hypertension Hyperlipidemia, goal LDL less than 70 ED  PLAN: 1.   Luis Tucker seems to be doing well without chest pain or worsening shortness of breath.  Will plan to refill his meds today.  He needs repeat labs to make sure he is still at target LDL since his last cholesterol test through [facility] was in 2023.  Obesity continues to be an issue.  He is interested in weight loss medications and we will refer him to our Pharm.D.'s to see if he would qualify for [facility] given his cardiovascular disease and obesity.  In addition he understands he needs to continue to work with dietary calorie restriction as well as reducing saturated fats and continue to get moderate to high intensity exercise 5 to 7 days a week if possible.  He is retired.  Finally, we will refill his tadalafil.  He can get that through  [facility] which is mail-order.  He understands he cannot take this with nitrates.  Plan follow-up with me annually or sooner as necessary.  Chrystie Nose, MD, [facility], FACP  [place]  Medical Director of the Advanced Lipid Disorders &  Cardiovascular Risk Reduction Clinic Diplomate of the American Board of Clinical Lipidology Attending Cardiologist  Direct Dial: [phone]  Fax: [phone]  Website:  [email or web address]   Lisette Abu Janeene Sand 02/18/2024, 8:15 AM

## 2024-02-18 NOTE — Patient Instructions (Signed)
 Medication Instructions:  CONTINUE current medications Meds [facility]   START famotidine (pepcid) -- 1 tablet twice daily as needed for reflux [facility]  TAKE tadalafil 10mg  tablet -- 1 tablet as needed DO NOT USE with nitroglycerin Rx: [facility]  *If you need a refill on your cardiac medications before your next appointment, please call your pharmacy*   Lab Work: Lipid Panel and LPa  If you have labs (blood work) drawn today and your tests are completely normal, you will receive your results only by: MyChart Message (if you have MyChart) OR A paper copy in the mail If you have any lab test that is abnormal or we need to change your treatment, we will call you to review the results.   Follow-Up: At [facility], you and your health needs are our priority.  As part of our continuing mission to provide you with exceptional heart care, we have created designated Provider Care Teams.  These Care Teams include your primary Cardiologist (physician) and Advanced Practice Providers (APPs -  Physician Assistants and Nurse Practitioners) who all work together to provide you with the care you need, when you need it.  We recommend signing up for the patient portal called "MyChart".  Sign up information is provided on this After Visit Summary.  MyChart is used to connect with patients for Virtual Visits (Telemedicine).  Patients are able to view lab/test results, encounter notes, upcoming appointments, etc.  Non-urgent messages can be sent to your provider as well.   To learn more about what you can do with MyChart, go to [email or web address].    Your next appointment:   12 months with Dr. Rennis Golden Other Instructions You have been referred to [facility] clinical pharmacy team to discuss weight loss medications    1st Floor: - Lobby - Registration  - Pharmacy  - Lab - Cafe  2nd Floor: - PV Lab - Diagnostic Testing (echo, CT, nuclear med)  3rd Floor: - Vacant  4th  Floor: - TCTS (cardiothoracic surgery) - AFib Clinic - Structural Heart Clinic - Vascular Surgery  - Vascular Ultrasound  5th Floor: - HeartCare Cardiology (general and EP) - Clinical Pharmacy for coumadin, hypertension, lipid, weight-loss medications, and med management appointments    Valet parking services will be available as well.

## 2024-02-19 LAB — LIPID PANEL
Chol/HDL Ratio: 3.5 ratio (ref 0.0–5.0)
Cholesterol, Total: 99 mg/dL — ABNORMAL LOW (ref 100–199)
HDL: 28 mg/dL — ABNORMAL LOW (ref >39)
LDL Chol Calc (NIH): 56 mg/dL (ref 0–99)
Triglycerides: 73 mg/dL (ref 0–149)
VLDL Cholesterol Cal: 15 mg/dL (ref 5–40)

## 2024-02-19 LAB — LIPOPROTEIN A (LPA): Lipoprotein (a): 110.2 nmol/L — ABNORMAL HIGH (ref <75.0)

## 2024-02-24 ENCOUNTER — Encounter: Payer: Self-pay | Admitting: Internal Medicine

## 2024-04-10 ENCOUNTER — Ambulatory Visit
Payer: Medicare HMO | Attending: Cardiovascular Disease | Admitting: Pharmacist Clinician (PhC)/ Clinical Pharmacy Specialist

## 2024-04-10 ENCOUNTER — Encounter: Payer: Self-pay | Admitting: Pharmacist Clinician (PhC)/ Clinical Pharmacy Specialist

## 2024-04-10 VITALS — Ht 71.0 in | Wt 261.0 lb

## 2024-04-10 DIAGNOSIS — Z6838 Body mass index (BMI) 38.0-38.9, adult: Secondary | ICD-10-CM

## 2024-04-10 DIAGNOSIS — E8881 Metabolic syndrome: Secondary | ICD-10-CM

## 2024-04-10 NOTE — Assessment & Plan Note (Signed)
  Patient has not met goal of at least 5% of body weight loss with comprehensive lifestyle modifications alone in the past 3-6 months. Pharmacotherapy is appropriate to pursue as augmentation. Will start Wegovy.   Last A1c was 12/2022 at 6.3.  Will repeat A1c next week to see if patient diabetic.  If so, will try for Ozempic instead.    Confirmed no personal or family history of medullary thyroid carcinoma (MTC) or Multiple Endocrine Neoplasia syndrome type 2 (MEN 2).   Advised patient on common side effects including nausea, diarrhea, dyspepsia, decreased appetite, and fatigue. Counseled patient on reducing meal size and how to titrate medication to minimize side effects. Patient aware to call if intolerable side effects or if experiencing dehydration, abdominal pain, or dizziness. Patient will adhere to dietary modifications and will target at least 150 minutes of moderate intensity exercise weekly.   Injection technique reviewed at today's visit.    Patient aware of the changes he needs to make to diet/exercise to help achieve his goal.    Titration Plan:  Will plan to follow the titration plan as below, pending patient is tolerating each dose before increasing to the next. Can slow titration if needed for tolerability.    -Month 1: Inject 0.25 mg SQ once weekly x 4 weeks -Month 2: Inject 0.5 mg SQ once weekly x 4 weeks -Month 3: Inject 1 mg SQ once weekly x 4 weeks -Month 4+: Inject 1.7 mg SQ once weekly   Follow up in 3 months.

## 2024-04-10 NOTE — Patient Instructions (Signed)
 We will start the prior authorization process to get Wegovy covered by your insurance.   TIPS FOR SUCCESS Write down the reasons why you want to lose weight and post it in a place where you'll see it often. Start small and work your way up. Keep in min

## 2024-04-10 NOTE — Progress Notes (Signed)
 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 male patient of Dr Maximo Spar, in the office today to discuss options for weight management.  Notes that his weight has fluctuated over the years.  Gained weight when he quit smoking, then lost some after retirement when he started walking every day.  Hopes to get weight down to about 215 pounds.   Current weight management medications: none  Previously tried meds: no  Current meds that may affect weight: none  Baseline weight/BMI: 118.4 kg // 36.42  Insurance payor: [employer] [phone]  Diet: protiens - likes chicken and fish; occasionally fried food; dessert is weakness    Exercise: retired [employer], had dropped about 30 with just walking, but gained some of that back when he quit walking daily  Confirmed patient has no personal or family history of medullary thyroid carcinoma (MTC) or Multiple Endocrine Neoplasia syndrome type 2 (MEN 2).   Social History:   Tobacco: quit 8-10 years ago   Accessory Clinical Findings    Lab Results  Component Value Date   CREATININE 1.16 07/16/2017   BUN 19 07/16/2017   NA 139 07/16/2017   K 3.8 07/16/2017   CL 104 07/16/2017   CO2 26 07/16/2017   Lab Results  Component Value Date   ALT 32 12/30/2015   AST 29 12/30/2015   ALKPHOS 65 12/30/2015   BILITOT 0.8 12/30/2015   Lab Results  Component Value Date   HGBA1C 6.3 (H) 01/21/2023      Home Medications/Allergies    Current Outpatient Medications  Medication Sig Dispense Refill   aspirin  EC 81 MG tablet Take 81 mg by mouth  daily. Swallow whole.     atorvastatin  (LIPITOR ) 80 MG tablet Take 1 tablet (80 mg total) by mouth daily. 90 tablet 3   carvedilol  (COREG ) 12.5 MG tablet Take 1 tablet (12.5 mg total) by mouth 2 (two) times daily with a meal. 180 tablet 3   famotidine  (PEPCID ) 20 MG tablet Take 1 tablet (20 mg total) by mouth 2 (two) times daily as needed for heartburn or indigestion. 60 tablet 3   furosemide  (LASIX ) 20 MG tablet TAKE 1 TABLET BY MOUTH DAILY. MAY TAKE EXTRA TABLET DAILY AS NEEDED FOR SWELLING. (Patient not taking: Reported on 02/18/2024) 60 tablet 5   losartan  (COZAAR ) 25 MG tablet Take 1 tablet (25 mg total) by mouth daily. 90 tablet 3   nitroGLYCERIN  (NITROSTAT ) 0.4 MG SL tablet Place 1 tablet (0.4 mg total) under the tongue every 5 (five) minutes as needed for chest pain. 25 tablet 1   tadalafil  (CIALIS ) 10 MG tablet Take 10 mg by mouth 30 minutes to 1 hour before sexual relations 10 tablet 1   triamterene -hydrochlorothiazide  (DYAZIDE ) 37.5-25 MG capsule Take 1 each (1 capsule total) by mouth daily. 90 capsule 3   Vitamin D , Ergocalciferol , (DRISDOL ) 1.25 MG (50000 UNIT) CAPS capsule Take 1 capsule (50,000 Units total) by mouth every 7 (seven) days. (Patient not taking: Reported on 02/18/2024)  5 capsule 0   No current facility-administered medications for this visit.     No Known Allergies  Assessment & Plan    BMI 38.0-38.9,adult  Patient has not met goal of at least 5% of body weight loss with comprehensive lifestyle modifications alone in the past 3-6 months. Pharmacotherapy is appropriate to pursue as augmentation. Will start Wegovy.   Last A1c was 12/2022 at 6.3.  Will repeat A1c next week to see if patient diabetic.  If so, will try for Ozempic instead.    Confirmed no personal or family history of medullary thyroid carcinoma (MTC) or Multiple Endocrine Neoplasia syndrome type 2 (MEN 2).   Advised patient on common side effects including nausea, diarrhea, dyspepsia, decreased  appetite, and fatigue. Counseled patient on reducing meal size and how to titrate medication to minimize side effects. Patient aware to call if intolerable side effects or if experiencing dehydration, abdominal pain, or dizziness. Patient will adhere to dietary modifications and will target at least 150 minutes of moderate intensity exercise weekly.   Injection technique reviewed at today's visit.    Patient aware of the changes he needs to make to diet/exercise to help achieve his goal.    Titration Plan:  Will plan to follow the titration plan as below, pending patient is tolerating each dose before increasing to the next. Can slow titration if needed for tolerability.    -Month 1: Inject 0.25 mg SQ once weekly x 4 weeks -Month 2: Inject 0.5 mg SQ once weekly x 4 weeks -Month 3: Inject 1 mg SQ once weekly x 4 weeks -Month 4+: Inject 1.7 mg SQ once weekly   Follow up in 3 months.    Ralphie Lovelady PharmD CPP [facility]  [address] [place], [place] [address] [phone]

## 2024-04-15 ENCOUNTER — Other Ambulatory Visit (HOSPITAL_COMMUNITY): Payer: Self-pay

## 2024-04-15 ENCOUNTER — Telehealth: Payer: Self-pay | Admitting: Pharmacist Clinician (PhC)/ Clinical Pharmacy Specialist

## 2024-04-15 ENCOUNTER — Encounter: Payer: Self-pay | Admitting: Pharmacist Clinician (PhC)/ Clinical Pharmacy Specialist

## 2024-04-15 ENCOUNTER — Telehealth: Payer: Self-pay | Admitting: Pharmacy Technician

## 2024-04-15 LAB — HEMOGLOBIN A1C
Est. average glucose Bld gHb Est-mCnc: 120 mg/dL
Hgb A1c MFr Bld: 5.8 % — ABNORMAL HIGH (ref 4.8–5.6)

## 2024-04-15 NOTE — Telephone Encounter (Signed)
 Pharmacy Patient Advocate Encounter   Received notification from Pt Calls Messages that prior authorization for [facility] is required/requested.   Insurance verification completed.   The patient is insured through [employer] .   Per test claim: PA required; PA submitted to above mentioned insurance via CoverMyMeds Key/confirmation #/EOC [id number] Status is pending

## 2024-04-15 NOTE — Telephone Encounter (Signed)
 Pt returning call in regards to medication. Please advise

## 2024-04-15 NOTE — Telephone Encounter (Signed)
 LMOM for patient to return call - A1c dropped from 6.3 to 5.8.  WEXHBZ approved at $465/month

## 2024-04-15 NOTE — Telephone Encounter (Signed)
 Pharmacy Patient Advocate Encounter  Received notification from [facility] that Prior Authorization for wegovy has been APPROVED from 04/15/24 to 12/23/24. Ran test claim, Copay is $464.49. This test claim was processed through [facility]- copay amounts may vary at other pharmacies due to pharmacy/plan contracts, or as the patient moves through the different stages of their insurance plan.

## 2024-04-15 NOTE — Telephone Encounter (Signed)
 Please do PA for [facility], pt has history of MI

## 2024-04-15 NOTE — Telephone Encounter (Addendum)
 Patient identification verified by 2 forms.  Called and spoke to patient  Patient states:  -He was on the lawn mower and did not hear the phone ring before.   Patient denies:  -Being willing to pay $465 for medication             Interventions/Plan: -Pt states he will get back on the treadmill like before because it's a lot to pay.

## 2024-04-22 ENCOUNTER — Other Ambulatory Visit (HOSPITAL_BASED_OUTPATIENT_CLINIC_OR_DEPARTMENT_OTHER): Payer: Self-pay | Admitting: Internal Medicine

## 2024-04-24 NOTE — Telephone Encounter (Signed)
 Order signed by Dr. Maximo Spar.

## 2024-05-16 ENCOUNTER — Other Ambulatory Visit (HOSPITAL_BASED_OUTPATIENT_CLINIC_OR_DEPARTMENT_OTHER): Payer: Self-pay | Admitting: Internal Medicine

## 2024-05-19 NOTE — Telephone Encounter (Signed)
 Defer to PCP

## 2024-05-19 NOTE — Telephone Encounter (Signed)
 Dr. Candida Chalk pt. As this is not a Cardiac RX, does Dr. Maximo Spar want to refill? Please advise.

## 2024-05-26 ENCOUNTER — Telehealth: Payer: Self-pay | Admitting: Internal Medicine

## 2024-05-26 NOTE — Telephone Encounter (Signed)
*  STAT* If patient is at the pharmacy, call can be transferred to refill team.   1. Which medications need to be refilled? (please list name of each medication and dose if known)   triamterene -hydrochlorothiazide  (DYAZIDE ) 37.5-25 MG capsule   2. Would you like to learn more about the convenience, safety, & potential cost savings by using the [facility]?   3. Are you open to using the [facility] (Type [facility]. ).  4. Which pharmacy/location (including street and city if local pharmacy) is medication to be sent to?  [id number] - [place], [place] - [address]   5. Do they need a 30 day or 90 day supply?   90 day  Patient stated he has 3 capsules left.

## 2024-05-26 NOTE — Telephone Encounter (Signed)
 Called pt to inform him that his medication was already at his pharmacy and that he needed to contact his pharmacy to request a refill. I advised that pt that if he has any other problems, questions or concerns, to give our office a call back. Pt verbalized understanding.

## 2024-08-13 ENCOUNTER — Other Ambulatory Visit (HOSPITAL_BASED_OUTPATIENT_CLINIC_OR_DEPARTMENT_OTHER): Payer: Self-pay | Admitting: Internal Medicine

## 2024-10-05 ENCOUNTER — Telehealth: Payer: Self-pay | Admitting: Internal Medicine

## 2024-10-05 NOTE — Telephone Encounter (Signed)
 Spoke with pt. Pt states he believes the pain is muscle at this point. Denies any symptom other than pin pointed muscle pain. Recent BP was 118/65. Pt is feeling fine otherwise. Pt will try heat and aspirin . Will call back if on going. ED/911 precautions given if symptoms start. Pt stated understanding

## 2024-10-05 NOTE — Telephone Encounter (Signed)
 Pt c/o of Chest Pain: STAT if active (IN THIS MOMENT) CP, including tightness, pressure, jaw pain, shoulder/upper arm/back pain, SOB, nausea, and vomiting.  1. Are you having CP right now (tightness, pressure, or discomfort)? No   2. Are you experiencing any other symptoms (ex. SOB, nausea, vomiting, sweating)? No   3. How long have you been experiencing CP? Few days   4. Is your CP continuous or coming and going? Coming and going   5. Have you taken Nitroglycerin ? No   6. If CP returns before callback, please consider calling 911. ?

## 2025-01-27 ENCOUNTER — Other Ambulatory Visit (HOSPITAL_BASED_OUTPATIENT_CLINIC_OR_DEPARTMENT_OTHER): Payer: Self-pay | Admitting: Internal Medicine

## 2025-01-29 MED ORDER — LOSARTAN POTASSIUM 25 MG PO TABS: 25.0000 mg | ORAL_TABLET | Freq: Every day | ORAL | 0 refills | Status: AC

## 2025-01-29 NOTE — Telephone Encounter (Signed)
 In accordance with refill protocols, please review and address the following requirements before this medication refill can be authorized:  Labs  Pt needs labs within 365 days within normal range

## 2025-04-09 ENCOUNTER — Ambulatory Visit: Admitting: Internal Medicine
